# Patient Record
Sex: Female | Born: 1965 | Race: White | Marital: Married | Smoking: Former smoker
Health system: Southern US, Community
[De-identification: ages and names within clinical notes are randomized; demographics above are authoritative.]

## PROBLEM LIST (undated history)

## (undated) DIAGNOSIS — M1990 Unspecified osteoarthritis, unspecified site: Secondary | ICD-10-CM

## (undated) DIAGNOSIS — K222 Esophageal obstruction: Secondary | ICD-10-CM

## (undated) DIAGNOSIS — Z0000 Encounter for general adult medical examination without abnormal findings: Secondary | ICD-10-CM

## (undated) DIAGNOSIS — E040 Nontoxic diffuse goiter: Secondary | ICD-10-CM

## (undated) DIAGNOSIS — R748 Abnormal levels of other serum enzymes: Secondary | ICD-10-CM

## (undated) DIAGNOSIS — K259 Gastric ulcer, unspecified as acute or chronic, without hemorrhage or perforation: Secondary | ICD-10-CM

## (undated) DIAGNOSIS — E785 Hyperlipidemia, unspecified: Secondary | ICD-10-CM

## (undated) DIAGNOSIS — K760 Fatty (change of) liver, not elsewhere classified: Secondary | ICD-10-CM

## (undated) HISTORY — DX: Abnormal levels of other serum enzymes: R74.8

## (undated) HISTORY — DX: Unspecified osteoarthritis, unspecified site: M19.90

## (undated) HISTORY — DX: Gastric ulcer, unspecified as acute or chronic, without hemorrhage or perforation: K25.9

## (undated) HISTORY — DX: Hyperlipidemia, unspecified: E78.5

## (undated) HISTORY — PX: COSMETIC SURGERY: SHX468

## (undated) HISTORY — DX: Nontoxic diffuse goiter: E04.0

## (undated) HISTORY — DX: Encounter for general adult medical examination without abnormal findings: Z00.00

## (undated) HISTORY — DX: Fatty (change of) liver, not elsewhere classified: K76.0

## (undated) HISTORY — DX: Esophageal obstruction: K22.2

## (undated) HISTORY — PX: TONSILLECTOMY AND ADENOIDECTOMY: SUR1326

## (undated) HISTORY — PX: INNER EAR SURGERY: SHX679

---

## 2006-05-02 HISTORY — PX: CERVICAL CONIZATION W/BX: SHX1330

## 2013-10-07 ENCOUNTER — Encounter (INDEPENDENT_AMBULATORY_CARE_PROVIDER_SITE_OTHER): Payer: Self-pay

## 2013-10-07 ENCOUNTER — Encounter: Payer: Self-pay | Admitting: Family Medicine

## 2013-10-07 ENCOUNTER — Ambulatory Visit (INDEPENDENT_AMBULATORY_CARE_PROVIDER_SITE_OTHER): Payer: No Typology Code available for payment source | Admitting: Family Medicine

## 2013-10-07 VITALS — BP 131/89 | HR 80 | Ht 62.0 in | Wt 138.0 lb

## 2013-10-07 DIAGNOSIS — M25552 Pain in left hip: Secondary | ICD-10-CM

## 2013-10-07 DIAGNOSIS — M25559 Pain in unspecified hip: Secondary | ICD-10-CM

## 2013-10-07 DIAGNOSIS — M25512 Pain in left shoulder: Secondary | ICD-10-CM

## 2013-10-07 DIAGNOSIS — M25551 Pain in right hip: Secondary | ICD-10-CM

## 2013-10-07 DIAGNOSIS — M25519 Pain in unspecified shoulder: Secondary | ICD-10-CM

## 2013-10-07 NOTE — Patient Instructions (Signed)
You have rotator cuff impingement Try to avoid painful activities (overhead activities, lifting with extended arm) as much as possible. Aleve 2 tabs twice a day with food OR ibuprofen 3 tabs three times a day with food for pain and inflammation for 7-10 days then as needed. Can take tylenol in addition to this. Subacromial injection may be beneficial to help with pain and to decrease inflammation - will consider if exercises and anti-inflammatories not helpful enough. Consider physical therapy with transition to home exercise program. Do home exercise program with theraband and scapular stabilization exercises daily 3 sets of 10 once a day - these are very important for long term relief. If not improving at follow-up we will consider further imaging, physical therapy, injection, and/or nitro patches.  Your right hip pain is likely due to mild hip arthritis. Take tylenol 500mg  1-2 tabs three times a day for pain. Aleve 1-2 tabs twice a day with food Glucosamine sulfate 750mg  twice a day is a supplement that may help. Capsaicin topically up to four times a day may also help with pain. It's important that you continue to stay active. Consider physical therapy to strengthen muscles around the joint that hurts to take pressure off of the joint itself. Shoe inserts with good arch support may be helpful. Heat or ice 15 minutes at a time 3-4 times a day as needed to help with pain.  Your left hip pain is due to mild IT band syndrome, intermittent bursitis. Do home exercises 3 sets of 10 once a day (hip side raises, standing hip rotations). Pick 2-3 stretches from the handout, hold for 20-30 seconds and repeat each stretch 3 times once a day. Icing if needed 15 minutes at a time. Follow up with me in 5-6 weeks for these issues.

## 2013-10-08 ENCOUNTER — Encounter: Payer: Self-pay | Admitting: Family Medicine

## 2013-10-08 DIAGNOSIS — M25512 Pain in left shoulder: Secondary | ICD-10-CM | POA: Insufficient documentation

## 2013-10-08 DIAGNOSIS — M25551 Pain in right hip: Secondary | ICD-10-CM | POA: Insufficient documentation

## 2013-10-08 DIAGNOSIS — M25552 Pain in left hip: Secondary | ICD-10-CM | POA: Insufficient documentation

## 2013-10-08 NOTE — Assessment & Plan Note (Signed)
2/2 mild DJD, less likely labral tear.  Tylenol, nsaids, glucosamine, capsaicin, hip strengthening.  Consider PT, imaging.

## 2013-10-08 NOTE — Assessment & Plan Note (Signed)
2/2 mild IT band syndrome and bursitis.  Start home strengthening and stretching.  Icing as needed.  Consider injection if localizes and becomes constant at trochanteric bursa.  F/u in 5-6 weeks.

## 2013-10-08 NOTE — Assessment & Plan Note (Signed)
Left shoulder rotator cuff impingement - start with nsaids, avoiding overhead/reaching activities, home exercise program.  Consider PT, injection, nitro patches if not improving.

## 2013-10-08 NOTE — Progress Notes (Signed)
Patient ID: Shawna Johnson, female   DOB: 1966-02-04, 48 y.o.   MRN: [id number]  PCP: No primary provider on file.  Subjective:   HPI: Patient is a 48 y.o. female here for bilateral hip, left shoulder pain.  1. Left shoulder pain Patient denies known injury. Started when reaching back to wash her back in the shower. Worse with sleeping on this side, reaching up and behind her. No swelling or bruising. No neck pain or numbness/tingling.  2. Bilateral hip pain Left side painful laterally, right hip within groin. Only bothers her with walking or jogging. Walks 4-5 miles a day. Occasionally takes advil. No back pain, numbness/tingling.  Past Medical History  Diagnosis Date  . Hyperlipidemia     No current outpatient prescriptions on file prior to visit.   No current facility-administered medications on file prior to visit.    History reviewed. No pertinent past surgical history.  No Known Allergies  History   Social History  . Marital Status: Married    Spouse Name: N/A    Number of Children: N/A  . Years of Education: N/A   Occupational History  . Not on file.   Social History Main Topics  . Smoking status: Current Every Day Smoker  . Smokeless tobacco: Not on file     Comment: is using the e-cigarette  . Alcohol Use: Not on file  . Drug Use: Not on file  . Sexual Activity: Not on file   Other Topics Concern  . Not on file   Social History Narrative  . No narrative on file    Family History  Problem Relation Age of Onset  . Heart attack Father   . Hyperlipidemia Father   . Hypertension Father   . Sudden death Father     BP 131/89  Pulse 80  Ht 5\' 2"  (1.575 m)  Wt 138 lb (62.596 kg)  BMI 25.23 kg/m2  Review of Systems: See HPI above.    Objective:  Physical Exam:  Gen: NAD  Left shoulder: No swelling, ecchymoses.  No gross deformity. No TTP AC, biceps tendon. FROM without painful arc. Negative Hawkins, Neers. Negative Speeds,  Yergasons. Strength 5/5 with positive empty can and 5/5 no pain with resisted internal/external rotation. Negative apprehension. NV intact distally.  Back/hips: No gross deformity, scoliosis. TTP mildly left lateral hip over trochanter.  No midline or bony TTP.  No other back, hip tenderness. FROM with minimal pain on right logroll. Strength LEs 5/5 all muscle groups.   2+ MSRs in patellar and achilles tendons, equal bilaterally. Negative SLRs. Sensation intact to light touch bilaterally. Negative fabers and piriformis stretches. Mild pain with 4/5 strength left hip abduction.  Assessment & Plan:  1. Left shoulder rotator cuff impingement - start with nsaids, avoiding overhead/reaching activities, home exercise program.  Consider PT, injection, nitro patches if not improving.  2. Right hip pain - 2/2 mild DJD, less likely labral tear.  Tylenol, nsaids, glucosamine, capsaicin, hip strengthening.  Consider PT, imaging.  3. Left hip pain - 2/2 mild IT band syndrome and bursitis.  Start home strengthening and stretching.  Icing as needed.  Consider injection if localizes and becomes constant at trochanteric bursa.  F/u in 5-6 weeks.

## 2013-11-10 ENCOUNTER — Ambulatory Visit: Payer: BC Managed Care – PPO | Admitting: Family Medicine

## 2013-12-22 ENCOUNTER — Ambulatory Visit (INDEPENDENT_AMBULATORY_CARE_PROVIDER_SITE_OTHER): Payer: No Typology Code available for payment source | Admitting: Internal Medicine

## 2013-12-22 ENCOUNTER — Encounter: Payer: Self-pay | Admitting: Internal Medicine

## 2013-12-22 VITALS — BP 119/70 | HR 62 | Resp 16 | Ht 62.0 in | Wt 142.0 lb

## 2013-12-22 DIAGNOSIS — F17200 Nicotine dependence, unspecified, uncomplicated: Secondary | ICD-10-CM

## 2013-12-22 DIAGNOSIS — R87612 Low grade squamous intraepithelial lesion on cytologic smear of cervix (LGSIL): Secondary | ICD-10-CM

## 2013-12-22 DIAGNOSIS — R87619 Unspecified abnormal cytological findings in specimens from cervix uteri: Secondary | ICD-10-CM | POA: Insufficient documentation

## 2013-12-22 DIAGNOSIS — B977 Papillomavirus as the cause of diseases classified elsewhere: Secondary | ICD-10-CM

## 2013-12-22 DIAGNOSIS — Z789 Other specified health status: Secondary | ICD-10-CM | POA: Insufficient documentation

## 2013-12-22 DIAGNOSIS — E785 Hyperlipidemia, unspecified: Secondary | ICD-10-CM

## 2013-12-22 NOTE — Progress Notes (Signed)
   Subjective:    Patient ID: Shawna Johnson, female    DOB: 28-Oct-1965, 48 y.o.   MRN: [id number]  HPI Shawna Johnson is a new pt here for first visit.     PMH of abnormal mm ([facility] last one 09/2013),  ASCUS s/P leep in 2007 in [place],  Hyperlipidemia (on 5 mg lipitor for past 5 years) ,  DJD, and menopause   Abnormal pap:   Pt report Leep in 2007 and has been seeing [facility] since then.    She states paps have been normal post Leep procedure  Hyerlipidemia  She does not like to take meds every day   She does mention low libido.  Lots of changes in life.  Recently married 2013.  Moved to [place] in 2012, married June 2013 and daughter moved 03/2012.   Occasionally sad but not daily.  Does not cry  Denies daily depression.  Good relationship with husband.     Low libido not creating a lot of tension now  E-cig use  She is lowering her nicotine to try to stop completely   Review of Systems    see HPI Objective:   Physical Exam  Physical Exam  Nursing note and vitals reviewed.  Constitutional: She is oriented to person, place, and time. She appears well-developed and well-nourished.  HENT:  Head: Normocephalic and atraumatic.  Cardiovascular: Normal rate and regular rhythm. Exam reveals no gallop and no friction rub.  No murmur heard.  Pulmonary/Chest: Breath sounds normal. She has no wheezes. She has no rales.  Neurological: She is alert and oriented to person, place, and time.  Skin: Skin is warm and dry.  Psychiatric: She has a normal mood and affect. Her behavior is normal.             Assessment & Plan:  Hyperlipidemia  Will check fasting lipids at CPE in October.  Pt does not like to take daily meds.  ASCUS  S/P leep  Pt reports normal paps since 2007  Abnormal mm needs repeat in October  Prior ones done at [facility]  DJD  Menopause / low libido  Given HT edcuational sheet.  Advised no FDA approved HT for libido .  FDA studying  flibanserin but not approved now    E cigarette use.  She is lowering her nicotine concentration as she is trying to quit

## 2013-12-22 NOTE — Patient Instructions (Signed)
Keep CPE appt. With me

## 2014-01-07 ENCOUNTER — Encounter: Payer: Self-pay | Admitting: Internal Medicine

## 2014-01-07 DIAGNOSIS — N63 Unspecified lump in unspecified breast: Secondary | ICD-10-CM | POA: Insufficient documentation

## 2014-01-07 DIAGNOSIS — R921 Mammographic calcification found on diagnostic imaging of breast: Secondary | ICD-10-CM | POA: Insufficient documentation

## 2014-01-22 ENCOUNTER — Encounter: Payer: Self-pay | Admitting: *Deleted

## 2014-03-05 ENCOUNTER — Other Ambulatory Visit: Payer: Self-pay | Admitting: *Deleted

## 2014-03-05 ENCOUNTER — Telehealth: Payer: Self-pay | Admitting: *Deleted

## 2014-03-05 DIAGNOSIS — R928 Other abnormal and inconclusive findings on diagnostic imaging of breast: Secondary | ICD-10-CM

## 2014-03-05 NOTE — Telephone Encounter (Signed)
[facility] called needing a bilateral diagnostic MM order. Placed order and faxed as requested.

## 2014-03-22 ENCOUNTER — Telehealth: Payer: Self-pay | Admitting: Internal Medicine

## 2014-03-22 NOTE — Telephone Encounter (Signed)
Shawna Johnson  Call pt and let her know that I have received her mammogram report.    Her breast calcificatiions show no change in the past 2 years.  The radiologist recommends that she can go back to her normal screening yearly mammogram    thanks

## 2014-03-22 NOTE — Telephone Encounter (Signed)
I spoke with Shawna Johnson and she is aware of her mammogram results and that she can go back to her yearly mammogram screenings.-eh

## 2014-03-24 ENCOUNTER — Encounter: Payer: Self-pay | Admitting: *Deleted

## 2014-04-26 ENCOUNTER — Other Ambulatory Visit: Payer: Self-pay | Admitting: *Deleted

## 2014-04-26 DIAGNOSIS — E785 Hyperlipidemia, unspecified: Secondary | ICD-10-CM

## 2014-04-26 DIAGNOSIS — Z0000 Encounter for general adult medical examination without abnormal findings: Secondary | ICD-10-CM

## 2014-04-27 ENCOUNTER — Encounter: Payer: Self-pay | Admitting: Internal Medicine

## 2014-04-27 ENCOUNTER — Ambulatory Visit (HOSPITAL_BASED_OUTPATIENT_CLINIC_OR_DEPARTMENT_OTHER)
Admission: RE | Admit: 2014-04-27 | Discharge: 2014-04-27 | Disposition: A | Discharge status: Home / Self Care | Payer: No Typology Code available for payment source | Source: Ambulatory Visit | Attending: Internal Medicine | Admitting: Internal Medicine

## 2014-04-27 ENCOUNTER — Ambulatory Visit (INDEPENDENT_AMBULATORY_CARE_PROVIDER_SITE_OTHER): Payer: No Typology Code available for payment source | Admitting: Internal Medicine

## 2014-04-27 VITALS — BP 110/70 | HR 94 | Temp 97.9°F | Resp 16 | Ht 62.5 in | Wt 143.0 lb

## 2014-04-27 DIAGNOSIS — Z124 Encounter for screening for malignant neoplasm of cervix: Secondary | ICD-10-CM

## 2014-04-27 DIAGNOSIS — Z720 Tobacco use: Secondary | ICD-10-CM

## 2014-04-27 DIAGNOSIS — R921 Mammographic calcification found on diagnostic imaging of breast: Secondary | ICD-10-CM

## 2014-04-27 DIAGNOSIS — E010 Iodine-deficiency related diffuse (endemic) goiter: Secondary | ICD-10-CM

## 2014-04-27 DIAGNOSIS — E049 Nontoxic goiter, unspecified: Secondary | ICD-10-CM | POA: Diagnosis present

## 2014-04-27 DIAGNOSIS — Z0189 Encounter for other specified special examinations: Secondary | ICD-10-CM

## 2014-04-27 DIAGNOSIS — E785 Hyperlipidemia, unspecified: Secondary | ICD-10-CM

## 2014-04-27 DIAGNOSIS — R928 Other abnormal and inconclusive findings on diagnostic imaging of breast: Secondary | ICD-10-CM

## 2014-04-27 DIAGNOSIS — Z0000 Encounter for general adult medical examination without abnormal findings: Secondary | ICD-10-CM

## 2014-04-27 DIAGNOSIS — Z789 Other specified health status: Secondary | ICD-10-CM

## 2014-04-27 DIAGNOSIS — Z1151 Encounter for screening for human papillomavirus (HPV): Secondary | ICD-10-CM

## 2014-04-27 LAB — CBC WITH DIFFERENTIAL/PLATELET
BASOS ABS: 0.1 10*3/uL (ref 0.0–0.1)
Basophils Relative: 1 % (ref 0–1)
EOS ABS: 0.2 10*3/uL (ref 0.0–0.7)
Eosinophils Relative: 3 % (ref 0–5)
HCT: 40 % (ref 36.0–46.0)
Hemoglobin: 13.6 g/dL (ref 12.0–15.0)
LYMPHS PCT: 42 % (ref 12–46)
Lymphs Abs: 3.2 10*3/uL (ref 0.7–4.0)
MCH: 29.8 pg (ref 26.0–34.0)
MCHC: 34 g/dL (ref 30.0–36.0)
MCV: 87.5 fL (ref 78.0–100.0)
Monocytes Absolute: 0.7 10*3/uL (ref 0.1–1.0)
Monocytes Relative: 9 % (ref 3–12)
Neutro Abs: 3.5 10*3/uL (ref 1.7–7.7)
Neutrophils Relative %: 45 % (ref 43–77)
PLATELETS: 378 10*3/uL (ref 150–400)
RBC: 4.57 MIL/uL (ref 3.87–5.11)
RDW: 13.9 % (ref 11.5–15.5)
WBC: 7.7 10*3/uL (ref 4.0–10.5)

## 2014-04-27 LAB — POCT URINALYSIS DIPSTICK
BILIRUBIN UA: NEGATIVE
GLUCOSE UA: NEGATIVE
Ketones, UA: NEGATIVE
LEUKOCYTES UA: NEGATIVE
NITRITE UA: NEGATIVE
Protein, UA: NEGATIVE
Spec Grav, UA: 1.02
UROBILINOGEN UA: NEGATIVE
pH, UA: 6.5

## 2014-04-27 LAB — COMPLETE METABOLIC PANEL WITH GFR
ALBUMIN: 4.4 g/dL (ref 3.5–5.2)
ALT: 14 U/L (ref 0–35)
AST: 14 U/L (ref 0–37)
Alkaline Phosphatase: 128 U/L — ABNORMAL HIGH (ref 39–117)
BUN: 16 mg/dL (ref 6–23)
CALCIUM: 9.5 mg/dL (ref 8.4–10.5)
CO2: 23 mEq/L (ref 19–32)
CREATININE: 0.92 mg/dL (ref 0.50–1.10)
Chloride: 102 mEq/L (ref 96–112)
GFR, Est African American: 84 mL/min
GFR, Est Non African American: 73 mL/min
Glucose, Bld: 91 mg/dL (ref 70–99)
Potassium: 4.3 mEq/L (ref 3.5–5.3)
Sodium: 141 mEq/L (ref 135–145)
Total Bilirubin: 0.4 mg/dL (ref 0.2–1.2)
Total Protein: 6.9 g/dL (ref 6.0–8.3)

## 2014-04-27 LAB — LIPID PANEL
CHOL/HDL RATIO: 3.2 ratio
CHOLESTEROL: 177 mg/dL (ref 0–200)
HDL: 55 mg/dL (ref >39)
LDL Cholesterol: 97 mg/dL (ref 0–99)
Triglycerides: 125 mg/dL (ref <150)
VLDL: 25 mg/dL (ref 0–40)

## 2014-04-27 LAB — TSH: TSH: 0.947 u[IU]/mL (ref 0.350–4.500)

## 2014-04-27 NOTE — Progress Notes (Signed)
Subjective:    Patient ID: Shawna Johnson, female    DOB: [birth year], 48 y.o.   MRN: [id number]  HPI 11/2013 visit   & Plan:     Hyperlipidemia Will check fasting lipids at CPE in October. Pt does not like to take daily meds.  ASCUS S/P leep Pt reports normal paps since 2007  Abnormal mm needs repeat in October Prior ones done at [facility]  DJD  Menopause / low libido Given HT edcuational sheet. Advised no FDA approved HT for libido . FDA studying flibanserin but not approved now  E cigarette use. She is lowering her nicotine concentration as she is trying to quit          Encounter   Today    [place] is her for CPE  HM:  Pap Dr.Louk 2014  ( I do not have results)  ,   She is due for first colonoscopy , mm neg (calcification follow up 03/2014)    She is a 30 year smoker   Problem list and meds reviewed   Allergies  Allergen Reactions  . Sulfa Antibiotics Swelling   Past Medical History  Diagnosis Date  . Hyperlipidemia    Past Surgical History  Procedure Laterality Date  . Cervical conization w/bx  05/2006  . Inner ear surgery      several childhood   History   Social History  . Marital Status: Married    Spouse Name: N/A    Number of Children: N/A  . Years of Education: N/A   Occupational History  . Not on file.   Social History Main Topics  . Smoking status: Current Every Day Smoker    Types: E-cigarettes  . Smokeless tobacco: Never Used     Comment: is using the e-cigarette  . Alcohol Use: Yes     Comment: Rare  . Drug Use: No  . Sexual Activity: Yes    Partners: Male   Other Topics Concern  . Not on file   Social History Narrative  . No narrative on file   Family History  Problem Relation Age of Onset  . Heart attack Father   . Hyperlipidemia Father   . Hypertension Father   . Sudden death Father   . Breast cancer Maternal Aunt    Patient Active Problem List   Diagnosis Date Noted  . Breast calcifications on  mammogram  R breast stable for 18 months [facility] 01/07/2014  . Breast nodule  decreasing size mm 09/2013 [facility] 01/07/2014  . 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 Abnormal Pap smear of cervix  S/P Leep normal pap since 2007 12/22/2013  . Hyperlipidemia 12/22/2013  . Electronic cigarette use 12/22/2013  . Left shoulder pain 10/08/2013  . Left hip pain 10/08/2013  . Right hip pain 10/08/2013   Current Outpatient Prescriptions on File Prior to Visit  Medication Sig Dispense Refill  . atorvastatin (LIPITOR) 10 MG tablet Take 10 mg by mouth daily.       No current facility-administered medications on file prior to visit.      Review of Systems    see HPI Objective:   Physical Exam Physical Exam  Vital signs and nursing note reviewed  Constitutional: She is oriented to person, place, and time. She appears well-developed and well-nourished. She is cooperative.  HENT:  Head: Normocephalic and atraumatic.  Right Ear: Tympanic membrane normal.  Left Ear: Tympanic membrane normal.  Nose: Nose normal.  Mouth/Throat: Oropharynx is clear and  moist and mucous membranes are normal. No oropharyngeal exudate or posterior oropharyngeal erythema.  Eyes: Conjunctivae and EOM are normal. Pupils are equal, round, and reactive to light.  Neck: Neck supple. No JVD present. Carotid bruit is not present. No mass and no thyromegaly present.  Cardiovascular: Regular rhythm, normal heart sounds, intact distal pulses and normal pulses.  Exam reveals no gallop and no friction rub.   No murmur heard. Pulses:      Dorsalis pedis pulses are 2+ on the right side, and 2+ on the left side.  Pulmonary/Chest: Breath sounds normal. She has no wheezes. She has no rhonchi. She has no rales. Right breast exhibits no mass, no nipple discharge and no skin change. Left breast exhibits no mass, no nipple discharge and no skin change.  Abdominal: Soft. Bowel sounds are normal. She exhibits no  distension and no mass. There is no hepatosplenomegaly. There is no tenderness. There is no CVA tenderness.  Rectal no mass guaiac neg Genitourinary: Rectum normal, vagina normal and uterus normal. Rectal exam shows no mass. Guaiac negative stool. No labial fusion. There is no lesion on the right labia. There is no lesion on the left labia. Cervix exhibits no motion tenderness. Right adnexum displays no mass, no tenderness and no fullness. Left adnexum displays no mass, no tenderness and no fullness. No erythema around the vagina.  Musculoskeletal:       No active synovitis to any joint.    Lymphadenopathy:       Right cervical: No superficial cervical adenopathy present.      Left cervical: No superficial cervical adenopathy present.       Right axillary: No pectoral and no lateral adenopathy present.       Left axillary: No pectoral and no lateral adenopathy present.      Right: No inguinal adenopathy present.       Left: No inguinal adenopathy present.  Neurological: She is alert and oriented to person, place, and time. She has normal strength and normal reflexes. No cranial nerve deficit or sensory deficit. She displays a negative Romberg sign. Coordination and gait normal.  Skin: Skin is warm and dry. No abrasion, no bruising, no ecchymosis and no rash noted. No cyanosis. Nails show no clubbing.   She has multicolored flat lesion Right side of chest wall been present for [facility]  Psychiatric: She has a normal mood and affect. Her speech is normal and behavior is normal.          Assessment & Plan:   HM  Pap today,  Will refer for colonoscopy.  Discussed lung CA screening guidelines   Pt underage for screening CT . Phone number given for opthalmologist for first exam   Hyperlipidemia  On Zocor  Will check labs today   Thyromegaly:  Will get U/S today  Breast calcifications  Stable for a 2 year period.  Radiologist recommends screening in one year  Pt counseled   Skin lesion chest:   Pt has seen dermatologist for this lesion.  Advised to see her dematologist   E-cig use  Advised cessation.   Pt not ready now.         Assessment & Plan:

## 2014-04-27 NOTE — Patient Instructions (Signed)
Will set up referral to GI for colonoscopy   Give phone number to opthalmologist  Dr. Tora [facility]red [facility] . Patient to make appointmnet   To xray today    To lab today

## 2014-04-28 LAB — VITAMIN D 25 HYDROXY (VIT D DEFICIENCY, FRACTURES): VIT D 25 HYDROXY: 101 ng/mL — AB (ref 30–89)

## 2014-04-28 LAB — CYTOLOGY - PAP

## 2014-04-29 ENCOUNTER — Encounter: Payer: Self-pay | Admitting: Internal Medicine

## 2014-05-03 ENCOUNTER — Encounter: Payer: Self-pay | Admitting: Internal Medicine

## 2014-05-03 NOTE — Progress Notes (Signed)
I spoke with Shawna Johnson and went over the U/S results with her. She voiced understanding and that we would repeat this U/S in one year-eh

## 2014-05-10 ENCOUNTER — Telehealth: Payer: Self-pay | Admitting: Internal Medicine

## 2014-05-10 ENCOUNTER — Encounter: Payer: Self-pay | Admitting: Internal Medicine

## 2014-05-10 NOTE — Telephone Encounter (Signed)
Spoke with pt and informed of elevated alkaline phos  Will se her in December and repeat at that time

## 2014-05-16 ENCOUNTER — Encounter: Payer: Self-pay | Admitting: Internal Medicine

## 2014-05-16 ENCOUNTER — Telehealth: Payer: Self-pay | Admitting: Internal Medicine

## 2014-05-16 DIAGNOSIS — E042 Nontoxic multinodular goiter: Secondary | ICD-10-CM | POA: Insufficient documentation

## 2014-05-16 NOTE — Telephone Encounter (Signed)
Shawna Johnson   Call pt and let her know that I did receive the old records from Dr. Shelly Flatten her former GYN.  I recommend that she have yearly pap testing done.   Just document that you advised her of this in her chart

## 2014-05-17 NOTE — Telephone Encounter (Signed)
I spoke with Ginger and let her know that Dr. Coralyn Mark recommended that she get a PAP smear test done every year. -eh

## 2014-06-08 ENCOUNTER — Other Ambulatory Visit: Payer: Self-pay | Admitting: Internal Medicine

## 2014-06-08 ENCOUNTER — Ambulatory Visit (INDEPENDENT_AMBULATORY_CARE_PROVIDER_SITE_OTHER): Payer: No Typology Code available for payment source | Admitting: Internal Medicine

## 2014-06-08 ENCOUNTER — Encounter: Payer: Self-pay | Admitting: Internal Medicine

## 2014-06-08 VITALS — BP 118/73 | HR 73 | Resp 16 | Ht 62.5 in | Wt 145.0 lb

## 2014-06-08 DIAGNOSIS — Z23 Encounter for immunization: Secondary | ICD-10-CM

## 2014-06-08 DIAGNOSIS — R748 Abnormal levels of other serum enzymes: Secondary | ICD-10-CM

## 2014-06-08 NOTE — Patient Instructions (Signed)
See me as needed 

## 2014-06-08 NOTE — Progress Notes (Signed)
Subjective:    Patient ID: Shawna Johnson, female    DOB: Nov 05, 1965, 48 y.o.   MRN: [id number]  HPI 04/27/2014 HM Pap today, Will refer for colonoscopy. Discussed lung CA screening guidelines Pt underage for screening CT . Phone number given for opthalmologist for first exam   Hyperlipidemia On Zocor Will check labs today   Thyromegaly: Will get U/S today  Breast calcifications Stable for a 2 year period. Radiologist recommends screening in one year Pt counseled   Skin lesion chest: Pt has seen dermatologist for this lesion. Advised to see her dematologist   E-cig use Advised cessation. Pt not ready now.   TODAY :  Shawna Johnson is here for follow up of elevated alk phos. And elevated vitamin D.  She tells me she is now only taking 800 IU of vitamin D   No history of hepatitis, no abd pain   Allergies  Allergen Reactions  . Sulfa Antibiotics Swelling   Past Medical History  Diagnosis Date  . Hyperlipidemia    Past Surgical History  Procedure Laterality Date  . Cervical conization w/bx  05/2006  . Inner ear surgery      several childhood   History   Social History  . Marital Status: Married    Spouse Name: N/A    Number of Children: N/A  . Years of Education: N/A   Occupational History  . Not on file.   Social History Main Topics  . Smoking status: Current Every Day Smoker    Types: E-cigarettes  . Smokeless tobacco: Never Used     Comment: is using the e-cigarette  . Alcohol Use: Yes     Comment: Rare  . Drug Use: No  . Sexual Activity:    Partners: Male   Other Topics Concern  . Not on file   Social History Narrative   Family History  Problem Relation Age of Onset  . Heart attack Father   . Hyperlipidemia Father   . Hypertension Father   . Sudden death Father   . Breast cancer Maternal Aunt   . Congestive Heart Failure Sister    Patient Active Problem List   Diagnosis Date Noted  . Multiple thyroid nodules  see U/S 2015 mm  sized in left lobe 05/16/2014  . Breast calcifications on mammogram  R breast stable for 18 months [facility] 01/07/2014  . Breast nodule  decreasing size mm 09/2013 [facility] 01/07/2014  . 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 Abnormal Pap smear of cervix  S/P Leep normal pap since 2007 12/22/2013  . Hyperlipidemia 12/22/2013  . Electronic cigarette use 12/22/2013  . Left shoulder pain 10/08/2013  . Left hip pain 10/08/2013  . Right hip pain 10/08/2013   Current Outpatient Prescriptions on File Prior to Visit  Medication Sig Dispense Refill  . atorvastatin (LIPITOR) 10 MG tablet Take 10 mg by mouth daily.     No current facility-administered medications on file prior to visit.        Review of Systems See HPI    Objective:   Physical Exam Physical Exam  Nursing note and vitals reviewed.  Constitutional: She is oriented to person, place, and time. She appears well-developed and well-nourished.  HENT:  Head: Normocephalic and atraumatic.  Cardiovascular: Normal rate and regular rhythm. Exam reveals no gallop and no friction rub.  No murmur heard.  Pulmonary/Chest: Breath sounds normal. She has no wheezes. She has no rales.  Abd  Soft NT/ND no hsm  BS  pos Neurological: She is alert and oriented to person, place, and time.  Skin: Skin is warm and dry.  Psychiatric: She has a normal mood and affect. Her behavior is normal.        Assessment & Plan:  Elevated alk phos will recheck with GGT and ANA further management based on results  Elevated vitamin D  Pt has reduced her dose ot 800 IU

## 2014-06-09 LAB — HEPATITIS PANEL, ACUTE
HCV AB: NEGATIVE
HEP A IGM: NONREACTIVE
HEP B S AG: NEGATIVE
Hep B C IgM: NONREACTIVE

## 2014-06-09 LAB — COMPREHENSIVE METABOLIC PANEL
ALBUMIN: 4.3 g/dL (ref 3.5–5.2)
ALT: 18 U/L (ref 0–35)
AST: 17 U/L (ref 0–37)
Alkaline Phosphatase: 130 U/L — ABNORMAL HIGH (ref 39–117)
BUN: 15 mg/dL (ref 6–23)
CHLORIDE: 103 meq/L (ref 96–112)
CO2: 26 mEq/L (ref 19–32)
Calcium: 9.3 mg/dL (ref 8.4–10.5)
Creat: 0.77 mg/dL (ref 0.50–1.10)
Glucose, Bld: 91 mg/dL (ref 70–99)
POTASSIUM: 4.4 meq/L (ref 3.5–5.3)
Sodium: 140 mEq/L (ref 135–145)
TOTAL PROTEIN: 6.3 g/dL (ref 6.0–8.3)
Total Bilirubin: 0.5 mg/dL (ref 0.2–1.2)

## 2014-06-09 LAB — GAMMA GT: GGT: 20 U/L (ref 7–51)

## 2014-06-09 LAB — ACUTE HEP PANEL AND HEP B SURFACE AB
HCV Ab: NEGATIVE
HEP B S AG: NEGATIVE
Hep A IgM: NONREACTIVE
Hep B C IgM: NONREACTIVE
Hep B S Ab: NEGATIVE

## 2014-06-09 LAB — ANA: Anti Nuclear Antibody(ANA): NEGATIVE

## 2014-06-15 ENCOUNTER — Telehealth: Payer: Self-pay | Admitting: Internal Medicine

## 2014-06-15 DIAGNOSIS — R7989 Other specified abnormal findings of blood chemistry: Secondary | ICD-10-CM

## 2014-06-15 DIAGNOSIS — R945 Abnormal results of liver function studies: Principal | ICD-10-CM

## 2014-06-15 NOTE — Telephone Encounter (Signed)
Spoke with pt and informed of lab results  Pt did tell me she had mild elevation of alk phos dating back to 2010  Will get liver ultrasound.    She has colonoscopy scheduled in January

## 2014-06-21 ENCOUNTER — Encounter: Payer: No Typology Code available for payment source | Admitting: Internal Medicine

## 2014-07-05 ENCOUNTER — Ambulatory Visit (HOSPITAL_BASED_OUTPATIENT_CLINIC_OR_DEPARTMENT_OTHER)
Admission: RE | Admit: 2014-07-05 | Discharge: 2014-07-05 | Disposition: A | Discharge status: Home / Self Care | Payer: No Typology Code available for payment source | Source: Ambulatory Visit | Attending: Internal Medicine | Admitting: Internal Medicine

## 2014-07-05 ENCOUNTER — Other Ambulatory Visit (HOSPITAL_BASED_OUTPATIENT_CLINIC_OR_DEPARTMENT_OTHER): Payer: No Typology Code available for payment source

## 2014-07-05 DIAGNOSIS — R7989 Other specified abnormal findings of blood chemistry: Secondary | ICD-10-CM | POA: Insufficient documentation

## 2014-07-05 DIAGNOSIS — E785 Hyperlipidemia, unspecified: Secondary | ICD-10-CM | POA: Insufficient documentation

## 2014-07-05 DIAGNOSIS — R945 Abnormal results of liver function studies: Secondary | ICD-10-CM

## 2014-07-06 ENCOUNTER — Ambulatory Visit (AMBULATORY_SURGERY_CENTER): Payer: Self-pay | Admitting: *Deleted

## 2014-07-06 VITALS — Ht 62.5 in | Wt 141.0 lb

## 2014-07-06 DIAGNOSIS — Z1211 Encounter for screening for malignant neoplasm of colon: Secondary | ICD-10-CM

## 2014-07-06 MED ORDER — MOVIPREP 100 G PO SOLR: ORAL | Status: DC

## 2014-07-06 NOTE — Progress Notes (Signed)
No egg or soy allergy  No intubation problems per pt; did have nausea and vomiting with anesthesia one time  No diet medications taken  Registered in [facility]

## 2014-07-08 ENCOUNTER — Telehealth: Payer: Self-pay | Admitting: Internal Medicine

## 2014-07-08 NOTE — Telephone Encounter (Signed)
Spoke with pt   No mass in liver,  Fatty infiltration    Advised pt to see me in February and will repeat hepatic profile then.  She has colonoscopy later this month.  She voices understanding

## 2014-07-20 ENCOUNTER — Ambulatory Visit (AMBULATORY_SURGERY_CENTER): Payer: No Typology Code available for payment source | Admitting: Internal Medicine

## 2014-07-20 ENCOUNTER — Encounter: Payer: Self-pay | Admitting: Internal Medicine

## 2014-07-20 VITALS — BP 111/69 | HR 56 | Temp 97.3°F | Resp 20 | Ht 62.5 in | Wt 141.0 lb

## 2014-07-20 DIAGNOSIS — Z1211 Encounter for screening for malignant neoplasm of colon: Secondary | ICD-10-CM

## 2014-07-20 DIAGNOSIS — D123 Benign neoplasm of transverse colon: Secondary | ICD-10-CM

## 2014-07-20 MED ORDER — SODIUM CHLORIDE 0.9 % IV SOLN
500.0000 mL | INTRAVENOUS | Status: DC
Start: 2014-07-20 — End: 2014-07-20

## 2014-07-20 NOTE — Progress Notes (Signed)
Called to room to assist during endoscopic procedure.  Patient ID and intended procedure confirmed with present staff. Received instructions for my participation in the procedure from the performing physician.  

## 2014-07-20 NOTE — Progress Notes (Signed)
A/ox3 pleased with MAC, report to Suzanne RN 

## 2014-07-20 NOTE — Patient Instructions (Signed)
YOU HAD AN ENDOSCOPIC PROCEDURE TODAY AT THE [facility]: Refer to the procedure report that was given to you for any specific questions about what was found during the examination.  If the procedure report does not answer your questions, please call your gastroenterologist to clarify.  If you requested that your care partner not be given the details of your procedure findings, then the procedure report has been included in a sealed envelope for you to review at your convenience later.  YOU SHOULD EXPECT: Some feelings of bloating in the abdomen. Passage of more gas than usual.  Walking can help get rid of the air that was put into your GI tract during the procedure and reduce the bloating. If you had a lower endoscopy (such as a colonoscopy or flexible sigmoidoscopy) you may notice spotting of blood in your stool or on the toilet paper. If you underwent a bowel prep for your procedure, then you may not have a normal bowel movement for a few days.  DIET: Your first meal following the procedure should be a light meal and then it is ok to progress to your normal diet.  A half-sandwich or bowl of soup is an example of a good first meal.  Heavy or fried foods are harder to digest and may make you feel nauseous or bloated.  Likewise meals heavy in dairy and vegetables can cause extra gas to form and this can also increase the bloating.  Drink plenty of fluids but you should avoid alcoholic beverages for 24 hours.  ACTIVITY: Your care partner should take you home directly after the procedure.  You should plan to take it easy, moving slowly for the rest of the day.  You can resume normal activity the day after the procedure however you should NOT DRIVE or use heavy machinery for 24 hours (because of the sedation medicines used during the test).    SYMPTOMS TO REPORT IMMEDIATELY: A gastroenterologist can be reached at any hour.  During normal business hours, 8:30 AM to 5:00 PM Monday through Friday,  call [phone].  After hours and on weekends, please call the GI answering service at [phone] who will take a message and have the physician on call contact you.   Following lower endoscopy (colonoscopy or flexible sigmoidoscopy):  Excessive amounts of blood in the stool  Significant tenderness or worsening of abdominal pains  Swelling of the abdomen that is new, acute  Fever of 100F or higher  FOLLOW UP: If any biopsies were taken you will be contacted by phone or by letter within the next 1-3 weeks.  Call your gastroenterologist if you have not heard about the biopsies in 3 weeks.  Our staff will call the home number listed on your records the next business day following your procedure to check on you and address any questions or concerns that you may have at that time regarding the information given to you following your procedure. This is a courtesy call and so if there is no answer at the home number and we have not heard from you through the emergency physician on call, we will assume that you have returned to your regular daily activities without incident.  SIGNATURES/CONFIDENTIALITY: You and/or your care partner have signed paperwork which will be entered into your electronic medical record.  These signatures attest to the fact that that the information above on your After Visit Summary has been reviewed and is understood.  Full responsibility of the confidentiality of this   discharge information lies with you and/or your care-partner.   Please, read the handouts given to you by your recovery room nurse.

## 2014-07-20 NOTE — Op Note (Signed)
[place]  [address]. [place], [address]   COLONOSCOPY PROCEDURE REPORT  PATIENT: Shawna Johnson, Shawna Johnson  MR#: [id number] BIRTHDATE: [birth year] , 48  yrs. old GENDER: female ENDOSCOPIST: Jerene Bears, MD REFERRED [id number] Coralyn Mark, M.D. PROCEDURE DATE:  07/20/2014 PROCEDURE:   Colonoscopy with snare polypectomy First Screening Colonoscopy - Avg.  risk and is 50 yrs.  old or older Yes.  Prior Negative Screening - Now for repeat screening. N/A  History of Adenoma - Now for follow-up colonoscopy & has been > or = to 3 yrs.  N/A  Polyps Removed Today? Yes. ASA CLASS:   Class II INDICATIONS:average risk for colon cancer and first colonoscopy. MEDICATIONS: Monitored anesthesia care and Propofol 250 mg IV  DESCRIPTION OF PROCEDURE:   After the risks benefits and alternatives of the procedure were thoroughly explained, informed consent was obtained.  The digital rectal exam revealed no rectal mass.   The LB [id number]  endoscope was introduced through the anus and advanced to the cecum, which was identified by both the appendix and ileocecal valve. No adverse events experienced. The quality of the prep was good, using MoviPrep  The instrument was then slowly withdrawn as the colon was fully examined.   COLON FINDINGS: A sessile polyp measuring 5 mm in size was found at the hepatic flexure.  A polypectomy was performed with a cold snare.  The resection was complete, the polyp tissue was completely retrieved and sent to histology.   The examination was otherwise normal.  Retroflexed views revealed external hemorrhoids. The time to cecum=4 minutes 56 seconds.  Withdrawal time=12 minutes 26 seconds.  The scope was withdrawn and the procedure completed. COMPLICATIONS: There were no immediate complications.  ENDOSCOPIC IMPRESSION: 1.   Sessile polyp was found at the hepatic flexure; polypectomy was performed with a cold snare 2.   The examination was otherwise  normal  RECOMMENDATIONS: 1.  Await pathology results 2.  If the polyp removed today is proven to be an adenomatous (pre-cancerous) polyp, you will need a repeat colonoscopy in 5 years.  Otherwise you should continue to follow colorectal cancer screening guidelines for "routine risk" patients with colonoscopy in 10 years.  You will receive a letter within 1-2 weeks with the results of your biopsy as well as final recommendations.  Please call my office if you have not received a letter after 3 weeks.  eSigned:  Jerene Bears, MD 07/20/2014 9:15 AM   cc: Emi Belfast, MD and The Patient

## 2014-07-21 ENCOUNTER — Telehealth: Payer: Self-pay

## 2014-07-21 NOTE — Telephone Encounter (Signed)
  Follow up Call-  Call back number 07/20/2014  Post procedure Call Back phone  # [phone] cell  Permission to leave phone message Yes     Patient questions:  Do you have a fever, pain , or abdominal swelling? No. Pain Score  0 *  Have you tolerated food without any problems? Yes.    Have you been able to return to your normal activities? Yes.    Do you have any questions about your discharge instructions: Diet   No. Medications  No. Follow up visit  No.  Do you have questions or concerns about your Care? No.  Actions: * If pain score is 4 or above: No action needed, pain <4.

## 2014-07-27 ENCOUNTER — Encounter: Payer: Self-pay | Admitting: Internal Medicine

## 2014-07-30 ENCOUNTER — Telehealth: Payer: Self-pay | Admitting: *Deleted

## 2014-08-03 NOTE — Telephone Encounter (Signed)
Opened in error

## 2014-08-03 NOTE — Telephone Encounter (Signed)
opend in error

## 2014-08-23 ENCOUNTER — Ambulatory Visit (INDEPENDENT_AMBULATORY_CARE_PROVIDER_SITE_OTHER): Payer: No Typology Code available for payment source | Admitting: Internal Medicine

## 2014-08-23 ENCOUNTER — Encounter: Payer: Self-pay | Admitting: Internal Medicine

## 2014-08-23 VITALS — BP 124/68 | HR 109 | Resp 16 | Ht 62.5 in | Wt 146.0 lb

## 2014-08-23 DIAGNOSIS — K760 Fatty (change of) liver, not elsewhere classified: Secondary | ICD-10-CM

## 2014-08-23 DIAGNOSIS — R748 Abnormal levels of other serum enzymes: Secondary | ICD-10-CM

## 2014-08-23 LAB — HEPATIC FUNCTION PANEL
ALBUMIN: 4.2 g/dL (ref 3.5–5.2)
ALT: 20 U/L (ref 0–35)
AST: 15 U/L (ref 0–37)
Alkaline Phosphatase: 132 U/L — ABNORMAL HIGH (ref 39–117)
Bilirubin, Direct: 0.1 mg/dL (ref 0.0–0.3)
Indirect Bilirubin: 0.3 mg/dL (ref 0.2–1.2)
TOTAL PROTEIN: 6.5 g/dL (ref 6.0–8.3)
Total Bilirubin: 0.4 mg/dL (ref 0.2–1.2)

## 2014-08-23 NOTE — Progress Notes (Signed)
Subjective:    Patient ID: Shawna Johnson, female    DOB: 1965-09-20, 49 y.o.   MRN: [id number]  HPI  06/2014 note Assessment & Plan:  Elevated alk phos will recheck with GGT and ANA further management based on results  Elevated vitamin D Pt has reduced her dose ot 800 IU       [place] is here for follow up of elevated alk phos.  U/S of 1/ 6 shows no mass hepatic steatosis.  GGT and ANA all negative    Pt asymptomatic  Recent colonoscopy done Dr. Hilarie Fredrickson.    Allergies  Allergen Reactions  . Sulfa Antibiotics Swelling   Past Medical History  Diagnosis Date  . Hyperlipidemia   . Arthritis    Past Surgical History  Procedure Laterality Date  . Cervical conization w/bx  05/2006  . Inner ear surgery      several childhood   History   Social History  . Marital Status: Married    Spouse Name: N/A  . Number of Children: N/A  . Years of Education: N/A   Occupational History  . Not on file.   Social History Main Topics  . Smoking status: Current Every Day Smoker    Types: E-cigarettes  . Smokeless tobacco: Never Used     Comment: is using the e-cigarette  . Alcohol Use: Yes     Comment: Rare  . Drug Use: No  . Sexual Activity:    Partners: Male   Other Topics Concern  . Not on file   Social History Narrative   Family History  Problem Relation Age of Onset  . Heart attack Father   . Hyperlipidemia Father   . Hypertension Father   . Sudden death Father   . Breast cancer Maternal Aunt   . Congestive Heart Failure Sister   . Colon cancer Neg Hx   . Esophageal cancer Neg Hx   . Stomach cancer Neg Hx   . Rectal cancer Neg Hx    Patient Active Problem List   Diagnosis Date Noted  . Multiple thyroid nodules  see U/S 2015 mm sized in left lobe 05/16/2014  . Breast calcifications on mammogram  R breast stable for 18 months [facility] 01/07/2014  . Breast nodule  decreasing size mm 09/2013 [facility] 01/07/2014    . 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 Abnormal Pap smear of cervix  S/P Leep normal pap since 2007 12/22/2013  . Hyperlipidemia 12/22/2013  . Electronic cigarette use 12/22/2013  . Left shoulder pain 10/08/2013  . Left hip pain 10/08/2013  . Right hip pain 10/08/2013   Current Outpatient Prescriptions on File Prior to Visit  Medication Sig Dispense Refill  . atorvastatin (LIPITOR) 10 MG tablet Take 10 mg by mouth daily.    . Multiple Vitamins-Minerals (MULTIVITAMIN PO) Take by mouth daily.     No current facility-administered medications on file prior to visit.      Review of Systems See HPI    Objective:   Physical Exam  Physical Exam  Nursing note and vitals reviewed.  Constitutional: She is oriented to person, place, and time. She appears well-developed and well-nourished.  HENT:  Head: Normocephalic and atraumatic.  Cardiovascular: Normal rate and regular rhythm. Exam reveals no gallop and no friction rub.  No murmur heard.  Pulmonary/Chest: Breath sounds normal. She has no wheezes. She has no rales.  Neurological: She is alert and oriented to person, place, and time.  Skin: Skin is  warm and dry.  Psychiatric: She has a normal mood and affect. Her behavior is normal.             Assessment & Plan:  Elevated alk phos.  Will recheck today .  If still elevated will get GI opinion if furthere work up needed

## 2014-08-25 ENCOUNTER — Encounter: Payer: Self-pay | Admitting: Internal Medicine

## 2014-08-25 ENCOUNTER — Telehealth: Payer: Self-pay | Admitting: Internal Medicine

## 2014-08-25 DIAGNOSIS — R748 Abnormal levels of other serum enzymes: Secondary | ICD-10-CM

## 2014-08-25 NOTE — Telephone Encounter (Signed)
Spoke with pt and informed of alk phos.  She has seen Dr. Hilarie Fredrickson in past  Will make referral to him

## 2014-09-29 ENCOUNTER — Encounter: Payer: Self-pay | Admitting: *Deleted

## 2014-10-14 ENCOUNTER — Encounter: Payer: Self-pay | Admitting: Internal Medicine

## 2014-10-14 ENCOUNTER — Ambulatory Visit (INDEPENDENT_AMBULATORY_CARE_PROVIDER_SITE_OTHER): Payer: No Typology Code available for payment source | Admitting: Internal Medicine

## 2014-10-14 VITALS — BP 100/70 | HR 60 | Ht 62.5 in | Wt 146.4 lb

## 2014-10-14 DIAGNOSIS — R748 Abnormal levels of other serum enzymes: Secondary | ICD-10-CM

## 2014-10-14 DIAGNOSIS — K760 Fatty (change of) liver, not elsewhere classified: Secondary | ICD-10-CM | POA: Diagnosis not present

## 2014-10-14 DIAGNOSIS — Z1211 Encounter for screening for malignant neoplasm of colon: Secondary | ICD-10-CM | POA: Diagnosis not present

## 2014-10-14 NOTE — Patient Instructions (Signed)
Your physician has requested that you go to the basement for the following lab work before leaving today: Hepatic function, Celiac Panel, AMA, ANA, Antismooth muscle antibody, 5NT, Fractionated alkaline phosphatase  You have been scheduled for an appointment with Dr Willette Alma on Monday, 04/25/15 @ 9:30 am. Please arrive at 9:20 am for registration. Make sure to bring insurance cards and medication list as well as any copay you may have.  Their information is as follows: [employer] [facility]  [address]  [place]  [place], [place] [address]  Main: [phone]

## 2014-10-14 NOTE — Progress Notes (Signed)
Patient ID: Shawna Johnson, female   DOB: [birth year], 49 y.o.   MRN: [id number] HPI: Shawna Johnson is a 49 yo female known to me from screening colonoscopy earlier this year with a past medical history of fatty liver, hyperlipidemia who is seen in consultation at the request of Dr. Coralyn Johnson to evaluate elevated isolated alkaline phosphatase. She is here alone today. She reports she feels very well. She denies abdominal complaints or hepatobiliary complaints. No prior history of jaundice, lower extremity edema, ascites, itching. No family history of liver disease. No abdominal pain. No nausea or vomiting. Good appetite. No early satiety. Normal bowel movements without blood in her stool or melena. She does take atorvastatin daily as well as a multivitamin. She drinks alcohol approximately once per month but is never a heavy alcohol drinker or daily user. No IV drug use. No high-risk sexual behavior. She is married with one child.  Her alkaline phosphatase has been elevated though slightly on several occasions. She had a negative viral hepatitis panel. Abdominal ultrasound was performed in January 2016 which was reviewed by me and revealed increased echogenicity of the liver suggestive of fatty infiltration. No gallstones were seen. The pancreas was poorly visualized secondary to bowel gas. Spleen was normal in appearance and size. Kidneys appear normal.   Past Medical History  Diagnosis Date  . Hyperlipidemia   . Arthritis   . Fatty liver   . Elevated alkaline phosphatase level     Past Surgical History  Procedure Laterality Date  . Cervical conization w/bx  05/2006  . Inner ear surgery      several childhood    Outpatient Prescriptions Prior to Visit  Medication Sig Dispense Refill  . atorvastatin (LIPITOR) 10 MG tablet Take 10 mg by mouth daily.    . Multiple Vitamins-Minerals (MULTIVITAMIN PO) Take by mouth daily.     No facility-administered medications prior to visit.     Allergies  Allergen Reactions  . Sulfa Antibiotics Swelling    Family History  Problem Relation Age of Onset  . Heart attack Father   . Hyperlipidemia Father   . Hypertension Father   . Sudden death Father   . Breast cancer Maternal Aunt   . Congestive Heart Failure Sister   . Colon cancer Neg Hx   . Esophageal cancer Neg Hx   . Stomach cancer Neg Hx   . Rectal cancer Neg Hx     History  Substance Use Topics  . Smoking status: Former Smoker    Types: E-cigarettes  . Smokeless tobacco: Never Used     Comment: is using the e-cigarette  . Alcohol Use: 0.0 oz/week    0 Standard drinks or equivalent per week     Comment: Rare    ROS: As per history of present illness, otherwise negative  BP 100/70 mmHg  Pulse 60  Ht 5' 2.5" (1.588 m)  Wt 146 lb 6.4 oz (66.407 kg)  BMI 26.33 kg/m2 Constitutional: Well-developed and well-nourished. No distress. HEENT: Normocephalic and atraumatic. Oropharynx is clear and moist. No oropharyngeal exudate. Conjunctivae are normal.  No scleral icterus. Neck: Neck supple. Trachea midline. Cardiovascular: Normal rate, regular rhythm and intact distal pulses. No M/R/G Pulmonary/chest: Effort normal and breath sounds normal. No wheezing, rales or rhonchi. Abdominal: Soft, nontender, nondistended. Bowel sounds active throughout. There are no masses palpable. No hepatosplenomegaly. Extremities: no clubbing, cyanosis, or edema Lymphadenopathy: No cervical adenopathy noted. Neurological: Alert and oriented to person place and time. Skin: Skin is warm and dry.  No rashes noted. Psychiatric: Normal mood and affect. Behavior is normal.  RELEVANT LABS AND IMAGING: CBC    Component Value Date/Time   WBC 7.7 04/26/2014 1204   RBC 4.57 04/26/2014 1204   HGB 13.6 04/26/2014 1204   HCT 40.0 04/26/2014 1204   PLT 378 04/26/2014 1204   MCV 87.5 04/26/2014 1204   MCH 29.8 04/26/2014 1204   MCHC 34.0 04/26/2014 1204   RDW 13.9 04/26/2014 1204    LYMPHSABS 3.2 04/26/2014 1204   MONOABS 0.7 04/26/2014 1204   EOSABS 0.2 04/26/2014 1204   BASOSABS 0.1 04/26/2014 1204    CMP     Component Value Date/Time   NA 140 06/08/2014 1146   K 4.4 06/08/2014 1146   CL 103 06/08/2014 1146   CO2 26 06/08/2014 1146   GLUCOSE 91 06/08/2014 1146   BUN 15 06/08/2014 1146   CREATININE 0.77 06/08/2014 1146   CALCIUM 9.3 06/08/2014 1146   PROT 6.5 08/23/2014 1155   ALBUMIN 4.2 08/23/2014 1155   AST 15 08/23/2014 1155   ALT 20 08/23/2014 1155   ALKPHOS 132* 08/23/2014 1155   BILITOT 0.4 08/23/2014 1155   GFRNONAA 73 04/26/2014 1204   GFRAA 84 04/26/2014 1204   Results for Shawna Johnson (MRN [id number]) as of 10/14/2014 18:13  Ref. Range 04/26/2014 12:04 06/08/2014 11:46 08/23/2014 11:55  Alkaline Phosphatase Latest Ref Range: 39-117 U/L 128 (H) 130 (H) 132 (H)  Albumin Latest Ref Range: 3.5-5.2 g/dL 4.4 4.3 4.2  AST Latest Ref Range: 0-37 U/L 14 17 15   ALT Latest Ref Range: 0-35 U/L 14 18 20   Total Protein Latest Ref Range: 6.0-8.3 g/dL 6.9 6.3 6.5  Bilirubin, Direct Latest Ref Range: 0.0-0.3 mg/dL   0.1  Indirect Bilirubin Latest Ref Range: 0.2-1.2 mg/dL   0.3  Total Bilirubin Latest Ref Range: 0.2-1.2 mg/dL 0.4 0.5 0.4  GGT Latest Ref Range: 7-51 U/L  20    Lab Results  Component Value Date   ANA NEG 06/08/2014    ASSESSMENT/PLAN: 49 yo female known to me from screening colonoscopy earlier this year with a past medical history of fatty liver, hyperlipidemia who is seen in consultation at the request of Dr. Coralyn Johnson to evaluate elevated isolated alkaline phosphatase  1. Isolated elevated alk phos -- very slight elevation in serum alkaline phosphatase phosphatase. GGT normal. It is unlikely that this represents clinically significant liver dysfunction and it may be her alkaline phosphatase elevation was not related to liver at all. We discussed that alkaline phosphatase is also found in bone, the intestinal tract and can be elevated  postprandially. No evidence of liver dysfunction or chronic liver disease. I recommended additional labs for completeness to include repeat liver enzymes, IgG, antimitochondrial and smooth muscle antibodies, 5-NT. This enzyme elevation can be watched and if further increasing may need to see endocrinology for evaluation for bone disease. We discussed this together and she is comfortable with monitoring and additional labs today.  2. Fatty liver -- seen by ultrasound without elevation in serum transaminases. Suspicion for steatohepatitis very low. This can be monitored given normal liver enzymes. Elevated alk phosphatase is not felt secondary to fatty liver. Discussed the importance of diet and exercise though her weight today is felt to be healthy for her  3. CRC screening -- colonoscopy early 2016 without adenomatous polyps. Repeat in 10 years    HY:[id number] D [address], [place] [place], [place] [address]

## 2014-10-19 ENCOUNTER — Other Ambulatory Visit: Payer: Self-pay | Admitting: *Deleted

## 2014-10-19 ENCOUNTER — Other Ambulatory Visit: Payer: Self-pay | Admitting: Internal Medicine

## 2014-10-19 ENCOUNTER — Other Ambulatory Visit (INDEPENDENT_AMBULATORY_CARE_PROVIDER_SITE_OTHER): Payer: No Typology Code available for payment source

## 2014-10-19 DIAGNOSIS — R748 Abnormal levels of other serum enzymes: Secondary | ICD-10-CM | POA: Diagnosis not present

## 2014-10-19 LAB — IGA: IGA: 168 mg/dL (ref 68–378)

## 2014-10-19 LAB — HEPATIC FUNCTION PANEL
ALK PHOS: 146 U/L — AB (ref 39–117)
ALT: 16 U/L (ref 0–35)
AST: 15 U/L (ref 0–37)
Albumin: 4.2 g/dL (ref 3.5–5.2)
BILIRUBIN DIRECT: 0 mg/dL (ref 0.0–0.3)
BILIRUBIN TOTAL: 0.4 mg/dL (ref 0.2–1.2)
Total Protein: 6.8 g/dL (ref 6.0–8.3)

## 2014-10-19 NOTE — Telephone Encounter (Signed)
Pt called in wanting 90 day supply refill on atorvastatin to hold her over until she get established with new provider.

## 2014-10-20 ENCOUNTER — Other Ambulatory Visit: Payer: Self-pay | Admitting: Internal Medicine

## 2014-10-20 LAB — ALKALINE PHOSPHATASE, ISOENZYMES
ALK PHOS: 152 IU/L — AB (ref 39–117)
BONE FRACTION: 11 % — ABNORMAL LOW (ref 14–68)
INTESTINAL FRAC.: 0 % (ref 0–18)
LIVER FRACTION: 89 % — AB (ref 18–85)

## 2014-10-20 LAB — TISSUE TRANSGLUTAMINASE, IGA: Tissue Transglutaminase Ab, IgA: 1 U/mL (ref <4)

## 2014-10-20 LAB — ANA: Anti Nuclear Antibody(ANA): NEGATIVE

## 2014-10-20 MED ORDER — ATORVASTATIN CALCIUM 10 MG PO TABS: 10.0000 mg | ORAL_TABLET | Freq: Every day | ORAL | Status: DC

## 2014-10-21 LAB — MITOCHONDRIAL ANTIBODIES: Mitochondrial M2 Ab, IgG: 0.69 (ref <0.91)

## 2014-10-22 LAB — ANTI-SMOOTH MUSCLE ANTIBODY, IGG: SMOOTH MUSCLE AB: 5 U (ref <20)

## 2014-10-23 LAB — NUCLEOTIDASE, 5', BLOOD: 5-Nucleotidase: 5 U/L (ref <11)

## 2014-10-26 ENCOUNTER — Other Ambulatory Visit: Payer: Self-pay

## 2014-10-26 DIAGNOSIS — R7989 Other specified abnormal findings of blood chemistry: Secondary | ICD-10-CM

## 2014-10-26 DIAGNOSIS — R945 Abnormal results of liver function studies: Principal | ICD-10-CM

## 2014-11-17 ENCOUNTER — Encounter: Payer: Self-pay | Admitting: Internal Medicine

## 2015-04-11 ENCOUNTER — Telehealth: Payer: Self-pay

## 2015-04-11 NOTE — Telephone Encounter (Signed)
Pt aware.

## 2015-04-11 NOTE — Telephone Encounter (Signed)
-----   Message from Algernon Huxley, RN sent at 10/26/2014  4:12 PM EDT ----- Regarding: labs Needs labs, orders in epic

## 2015-04-14 ENCOUNTER — Other Ambulatory Visit (INDEPENDENT_AMBULATORY_CARE_PROVIDER_SITE_OTHER): Payer: BLUE CROSS/BLUE SHIELD

## 2015-04-14 DIAGNOSIS — R945 Abnormal results of liver function studies: Principal | ICD-10-CM

## 2015-04-14 DIAGNOSIS — R7989 Other specified abnormal findings of blood chemistry: Secondary | ICD-10-CM

## 2015-04-14 LAB — HEPATIC FUNCTION PANEL
ALT: 16 U/L (ref 0–35)
AST: 14 U/L (ref 0–37)
Albumin: 4.4 g/dL (ref 3.5–5.2)
Alkaline Phosphatase: 133 U/L — ABNORMAL HIGH (ref 39–117)
BILIRUBIN DIRECT: 0 mg/dL (ref 0.0–0.3)
Total Bilirubin: 0.4 mg/dL (ref 0.2–1.2)
Total Protein: 7.1 g/dL (ref 6.0–8.3)

## 2015-04-14 LAB — ALKALINE PHOSPHATASE: Alkaline Phosphatase: 133 U/L — ABNORMAL HIGH (ref 39–117)

## 2015-04-15 ENCOUNTER — Other Ambulatory Visit: Payer: Self-pay

## 2015-04-15 DIAGNOSIS — R7989 Other specified abnormal findings of blood chemistry: Secondary | ICD-10-CM

## 2015-04-15 DIAGNOSIS — R945 Abnormal results of liver function studies: Principal | ICD-10-CM

## 2015-04-25 ENCOUNTER — Ambulatory Visit (INDEPENDENT_AMBULATORY_CARE_PROVIDER_SITE_OTHER): Payer: BLUE CROSS/BLUE SHIELD | Admitting: Family Medicine

## 2015-04-25 ENCOUNTER — Encounter: Payer: Self-pay | Admitting: Family Medicine

## 2015-04-25 VITALS — BP 112/76 | HR 70 | Temp 98.0°F | Ht 62.5 in | Wt 144.0 lb

## 2015-04-25 DIAGNOSIS — N63 Unspecified lump in unspecified breast: Secondary | ICD-10-CM

## 2015-04-25 DIAGNOSIS — Z23 Encounter for immunization: Secondary | ICD-10-CM

## 2015-04-25 DIAGNOSIS — Z1239 Encounter for other screening for malignant neoplasm of breast: Secondary | ICD-10-CM | POA: Diagnosis not present

## 2015-04-25 DIAGNOSIS — K760 Fatty (change of) liver, not elsewhere classified: Secondary | ICD-10-CM

## 2015-04-25 DIAGNOSIS — E785 Hyperlipidemia, unspecified: Secondary | ICD-10-CM

## 2015-04-25 DIAGNOSIS — M1990 Unspecified osteoarthritis, unspecified site: Secondary | ICD-10-CM | POA: Insufficient documentation

## 2015-04-25 NOTE — Patient Instructions (Addendum)
[email or web address], Probiotics daily 10 strain version [employer] or [employer] [facility]   Preventive Care for Adults, Female A healthy lifestyle and preventive care can promote health and wellness. Preventive health guidelines for women include the following key practices.  A routine yearly physical is a good way to check with your health care provider about your health and preventive screening. It is a chance to share any concerns and updates on your health and to receive a thorough exam.  Visit your dentist for a routine exam and preventive care every 6 months. Brush your teeth twice a day and floss once a day. Good oral hygiene prevents tooth decay and gum disease.  The frequency of eye exams is based on your age, health, family medical history, use of contact lenses, and other factors. Follow your health care provider's recommendations for frequency of eye exams.  Eat a healthy diet. Foods like vegetables, fruits, whole grains, low-fat dairy products, and lean protein foods contain the nutrients you need without too many calories. Decrease your intake of foods high in solid fats, added sugars, and salt. Eat the right amount of calories for you.Get information about a proper diet from your health care provider, if necessary.  Regular physical exercise is one of the most important things you can do for your health. Most adults should get at least 150 minutes of moderate-intensity exercise (any activity that increases your heart rate and causes you to sweat) each week. In addition, most adults need muscle-strengthening exercises on 2 or more days a week.  Maintain a healthy weight. The body mass index (BMI) is a screening tool to identify possible weight problems. It provides an estimate of body fat based on height and weight. Your health care provider can find your BMI and can help you achieve or maintain a healthy weight.For adults 20 years and older:  A BMI below  18.5 is considered underweight.  A BMI of 18.5 to 24.9 is normal.  A BMI of 25 to 29.9 is considered overweight.  A BMI of 30 and above is considered obese.  Maintain normal blood lipids and cholesterol levels by exercising and minimizing your intake of saturated fat. Eat a balanced diet with plenty of fruit and vegetables. Blood tests for lipids and cholesterol should begin at age 59 and be repeated every 5 years. If your lipid or cholesterol levels are high, you are over 50, or you are at high risk for heart disease, you may need your cholesterol levels checked more frequently.Ongoing high lipid and cholesterol levels should be treated with medicines if diet and exercise are not working.  If you smoke, find out from your health care provider how to quit. If you do not use tobacco, do not start.  Lung cancer screening is recommended for adults aged 59-80 years who are at high risk for developing lung cancer because of a history of smoking. A yearly low-dose CT scan of the lungs is recommended for people who have at least a 30-pack-year history of smoking and are a current smoker or have quit within the past 15 years. A pack year of smoking is smoking an average of 1 pack of cigarettes a day for 1 year (for example: 1 pack a day for 30 years or 2 packs a day for 15 years). Yearly screening should continue until the smoker has stopped smoking for at least 15 years. Yearly screening should be stopped for people who develop a health problem that would prevent them  from having lung cancer treatment.  If you are pregnant, do not drink alcohol. If you are breastfeeding, be very cautious about drinking alcohol. If you are not pregnant and choose to drink alcohol, do not have more than 1 drink per day. One drink is considered to be 12 ounces (355 mL) of beer, 5 ounces (148 mL) of wine, or 1.5 ounces (44 mL) of liquor.  Avoid use of street drugs. Do not share needles with anyone. Ask for help if you need  support or instructions about stopping the use of drugs.  High blood pressure causes heart disease and increases the risk of stroke. Your blood pressure should be checked at least every 1 to 2 years. Ongoing high blood pressure should be treated with medicines if weight loss and exercise do not work.  If you are 3-29 years old, ask your health care provider if you should take aspirin to prevent strokes.  Diabetes screening is done by taking a blood sample to check your blood glucose level after you have not eaten for a certain period of time (fasting). If you are not overweight and you do not have risk factors for diabetes, you should be screened once every 3 years starting at age 30. If you are overweight or obese and you are 7-26 years of age, you should be screened for diabetes every year as part of your cardiovascular risk assessment.  Breast cancer screening is essential preventive care for women. You should practice "breast self-awareness." This means understanding the normal appearance and feel of your breasts and may include breast self-examination. Any changes detected, no matter how small, should be reported to a health care provider. Women in their 3s and 30s should have a clinical breast exam (CBE) by a health care provider as part of a regular health exam every 1 to 3 years. After age 76, women should have a CBE every year. Starting at age 36, women should consider having a mammogram (breast X-ray test) every year. Women who have a family history of breast cancer should talk to their health care provider about genetic screening. Women at a high risk of breast cancer should talk to their health care providers about having an MRI and a mammogram every year.  Breast cancer gene (BRCA)-related cancer risk assessment is recommended for women who have family members with BRCA-related cancers. BRCA-related cancers include breast, ovarian, tubal, and peritoneal cancers. Having family members with  these cancers may be associated with an increased risk for harmful changes (mutations) in the breast cancer genes BRCA1 and BRCA2. Results of the assessment will determine the need for genetic counseling and BRCA1 and BRCA2 testing.  Your health care provider may recommend that you be screened regularly for cancer of the pelvic organs (ovaries, uterus, and vagina). This screening involves a pelvic examination, including checking for microscopic changes to the surface of your cervix (Pap test). You may be encouraged to have this screening done every 3 years, beginning at age 38.  For women ages 24-65, health care providers may recommend pelvic exams and Pap testing every 3 years, or they may recommend the Pap and pelvic exam, combined with testing for human papilloma virus (HPV), every 5 years. Some types of HPV increase your risk of cervical cancer. Testing for HPV may also be done on women of any age with unclear Pap test results.  Other health care providers may not recommend any screening for nonpregnant women who are considered low risk for pelvic cancer and who  do not have symptoms. Ask your health care provider if a screening pelvic exam is right for you.  If you have had past treatment for cervical cancer or a condition that could lead to cancer, you need Pap tests and screening for cancer for at least 20 years after your treatment. If Pap tests have been discontinued, your risk factors (such as having a new sexual partner) need to be reassessed to determine if screening should resume. Some women have medical problems that increase the chance of getting cervical cancer. In these cases, your health care provider may recommend more frequent screening and Pap tests.  Colorectal cancer can be detected and often prevented. Most routine colorectal cancer screening begins at the age of 33 years and continues through age 32 years. However, your health care provider may recommend screening at an earlier age  if you have risk factors for colon cancer. On a yearly basis, your health care provider may provide home test kits to check for hidden blood in the stool. Use of a small camera at the end of a tube, to directly examine the colon (sigmoidoscopy or colonoscopy), can detect the earliest forms of colorectal cancer. Talk to your health care provider about this at age 3, when routine screening begins. Direct exam of the colon should be repeated every 5-10 years through age 48 years, unless early forms of precancerous polyps or small growths are found.  People who are at an increased risk for hepatitis B should be screened for this virus. You are considered at high risk for hepatitis B if:  You were born in a country where hepatitis B occurs often. Talk with your health care provider about which countries are considered high risk.  Your parents were born in a high-risk country and you have not received a shot to protect against hepatitis B (hepatitis B vaccine).  You have HIV or AIDS.  You use needles to inject street drugs.  You live with, or have sex with, someone who has hepatitis B.  You get hemodialysis treatment.  You take certain medicines for conditions like cancer, organ transplantation, and autoimmune conditions.  Hepatitis C blood testing is recommended for all people born from [birth year] through 1965 and any individual with known risks for hepatitis C.  Practice safe sex. Use condoms and avoid high-risk sexual practices to reduce the spread of sexually transmitted infections (STIs). STIs include gonorrhea, chlamydia, syphilis, trichomonas, herpes, HPV, and human immunodeficiency virus (HIV). Herpes, HIV, and HPV are viral illnesses that have no cure. They can result in disability, cancer, and death.  You should be screened for sexually transmitted illnesses (STIs) including gonorrhea and chlamydia if:  You are sexually active and are younger than 24 years.  You are older than 24 years and  your health care provider tells you that you are at risk for this type of infection.  Your sexual activity has changed since you were last screened and you are at an increased risk for chlamydia or gonorrhea. Ask your health care provider if you are at risk.  If you are at risk of being infected with HIV, it is recommended that you take a prescription medicine daily to prevent HIV infection. This is called preexposure prophylaxis (PrEP). You are considered at risk if:  You are sexually active and do not regularly use condoms or know the HIV status of your partner(s).  You take drugs by injection.  You are sexually active with a partner who has HIV.  Talk with  your health care provider about whether you are at high risk of being infected with HIV. If you choose to begin PrEP, you should first be tested for HIV. You should then be tested every 3 months for as long as you are taking PrEP.  Osteoporosis is a disease in which the bones lose minerals and strength with aging. This can result in serious bone fractures or breaks. The risk of osteoporosis can be identified using a bone density scan. Women ages 3 years and over and women at risk for fractures or osteoporosis should discuss screening with their health care providers. Ask your health care provider whether you should take a calcium supplement or vitamin D to reduce the rate of osteoporosis.  Menopause can be associated with physical symptoms and risks. Hormone replacement therapy is available to decrease symptoms and risks. You should talk to your health care provider about whether hormone replacement therapy is right for you.  Use sunscreen. Apply sunscreen liberally and repeatedly throughout the day. You should seek shade when your shadow is shorter than you. Protect yourself by wearing long sleeves, pants, a wide-brimmed hat, and sunglasses year round, whenever you are outdoors.  Once a month, do a whole body skin exam, using a mirror to  look at the skin on your back. Tell your health care provider of new moles, moles that have irregular borders, moles that are larger than a pencil eraser, or moles that have changed in shape or color.  Stay current with required vaccines (immunizations).  Influenza vaccine. All adults should be immunized every year.  Tetanus, diphtheria, and acellular pertussis (Td, Tdap) vaccine. Pregnant women should receive 1 dose of Tdap vaccine during each pregnancy. The dose should be obtained regardless of the length of time since the last dose. Immunization is preferred during the 27th-36th week of gestation. An adult who has not previously received Tdap or who does not know her vaccine status should receive 1 dose of Tdap. This initial dose should be followed by tetanus and diphtheria toxoids (Td) booster doses every 10 years. Adults with an unknown or incomplete history of completing a 3-dose immunization series with Td-containing vaccines should begin or complete a primary immunization series including a Tdap dose. Adults should receive a Td booster every 10 years.  Varicella vaccine. An adult without evidence of immunity to varicella should receive 2 doses or a second dose if she has previously received 1 dose. Pregnant females who do not have evidence of immunity should receive the first dose after pregnancy. This first dose should be obtained before leaving the health care facility. The second dose should be obtained 4-8 weeks after the first dose.  Human papillomavirus (HPV) vaccine. Females aged 13-26 years who have not received the vaccine previously should obtain the 3-dose series. The vaccine is not recommended for use in pregnant females. However, pregnancy testing is not needed before receiving a dose. If a female is found to be pregnant after receiving a dose, no treatment is needed. In that case, the remaining doses should be delayed until after the pregnancy. Immunization is recommended for any  person with an immunocompromised condition through the age of 56 years if she did not get any or all doses earlier. During the 3-dose series, the second dose should be obtained 4-8 weeks after the first dose. The third dose should be obtained 24 weeks after the first dose and 16 weeks after the second dose.  Zoster vaccine. One dose is recommended for adults aged 81  years or older unless certain conditions are present.  Measles, mumps, and rubella (MMR) vaccine. Adults born before [birth year] generally are considered immune to measles and mumps. Adults born in [birth year] or later should have 1 or more doses of MMR vaccine unless there is a contraindication to the vaccine or there is laboratory evidence of immunity to each of the three diseases. A routine second dose of MMR vaccine should be obtained at least 28 days after the first dose for students attending postsecondary schools, health care workers, or international travelers. People who received inactivated measles vaccine or an unknown type of measles vaccine during 1963-1967 should receive 2 doses of MMR vaccine. People who received inactivated mumps vaccine or an unknown type of mumps vaccine before 1979 and are at high risk for mumps infection should consider immunization with 2 doses of MMR vaccine. For females of childbearing age, rubella immunity should be determined. If there is no evidence of immunity, females who are not pregnant should be vaccinated. If there is no evidence of immunity, females who are pregnant should delay immunization until after pregnancy. Unvaccinated health care workers born before [birth year] who lack laboratory evidence of measles, mumps, or rubella immunity or laboratory confirmation of disease should consider measles and mumps immunization with 2 doses of MMR vaccine or rubella immunization with 1 dose of MMR vaccine.  Pneumococcal 13-valent conjugate (PCV13) vaccine. When indicated, a person who is uncertain of his immunization history  and has no record of immunization should receive the PCV13 vaccine. All adults 73 years of age and older should receive this vaccine. An adult aged 82 years or older who has certain medical conditions and has not been previously immunized should receive 1 dose of PCV13 vaccine. This PCV13 should be followed with a dose of pneumococcal polysaccharide (PPSV23) vaccine. Adults who are at high risk for pneumococcal disease should obtain the PPSV23 vaccine at least 8 weeks after the dose of PCV13 vaccine. Adults older than 51 years of age who have normal immune system function should obtain the PPSV23 vaccine dose at least 1 year after the dose of PCV13 vaccine.  Pneumococcal polysaccharide (PPSV23) vaccine. When PCV13 is also indicated, PCV13 should be obtained first. All adults aged 55 years and older should be immunized. An adult younger than age 35 years who has certain medical conditions should be immunized. Any person who resides in a nursing home or long-term care facility should be immunized. An adult smoker should be immunized. People with an immunocompromised condition and certain other conditions should receive both PCV13 and PPSV23 vaccines. People with human immunodeficiency virus (HIV) infection should be immunized as soon as possible after diagnosis. Immunization during chemotherapy or radiation therapy should be avoided. Routine use of PPSV23 vaccine is not recommended for American Indians, [place] Natives, or people younger than 65 years unless there are medical conditions that require PPSV23 vaccine. When indicated, people who have unknown immunization and have no record of immunization should receive PPSV23 vaccine. One-time revaccination 5 years after the first dose of PPSV23 is recommended for people aged 19-64 years who have chronic kidney failure, nephrotic syndrome, asplenia, or immunocompromised conditions. People who received 1-2 doses of PPSV23 before age 51 years should receive another dose  of PPSV23 vaccine at age 59 years or later if at least 5 years have passed since the previous dose. Doses of PPSV23 are not needed for people immunized with PPSV23 at or after age 33 years.  Meningococcal vaccine. Adults with asplenia or persistent complement  component deficiencies should receive 2 doses of quadrivalent meningococcal conjugate (MenACWY-D) vaccine. The doses should be obtained at least 2 months apart. Microbiologists working with certain meningococcal bacteria, [place] recruits, people at risk during an outbreak, and people who travel to or live in countries with a high rate of meningitis should be immunized. A first-year college student up through age 105 years who is living in a residence hall should receive a dose if she did not receive a dose on or after her 16th birthday. Adults who have certain high-risk conditions should receive one or more doses of vaccine.  Hepatitis A vaccine. Adults who wish to be protected from this disease, have certain high-risk conditions, work with hepatitis A-infected animals, work in hepatitis A research labs, or travel to or work in countries with a high rate of hepatitis A should be immunized. Adults who were previously unvaccinated and who anticipate close contact with an international adoptee during the first 60 days after arrival in the [place] from a country with a high rate of hepatitis A should be immunized.  Hepatitis B vaccine. Adults who wish to be protected from this disease, have certain high-risk conditions, may be exposed to blood or other infectious body fluids, are household contacts or sex partners of hepatitis B positive people, are clients or workers in certain care facilities, or travel to or work in countries with a high rate of hepatitis B should be immunized.  Haemophilus influenzae type b (Hib) vaccine. A previously unvaccinated person with asplenia or sickle cell disease or having a scheduled splenectomy should receive 1 dose  of Hib vaccine. Regardless of previous immunization, a recipient of a hematopoietic stem cell transplant should receive a 3-dose series 6-12 months after her successful transplant. Hib vaccine is not recommended for adults with HIV infection. Preventive Services / Frequency Ages 33 to 11 years  Blood pressure check.** / Every 3-5 years.  Lipid and cholesterol check.** / Every 5 years beginning at age 76.  Clinical breast exam.** / Every 3 years for women in their 27s and 63s.  BRCA-related cancer risk assessment.** / For women who have family members with a BRCA-related cancer (breast, ovarian, tubal, or peritoneal cancers).  Pap test.** / Every 2 years from ages 58 through 11. Every 3 years starting at age 55 through age 39 or 72 with a history of 3 consecutive normal Pap tests.  HPV screening.** / Every 3 years from ages 51 through ages 35 to 45 with a history of 3 consecutive normal Pap tests.  Hepatitis C blood test.** / For any individual with known risks for hepatitis C.  Skin self-exam. / Monthly.  Influenza vaccine. / Every year.  Tetanus, diphtheria, and acellular pertussis (Tdap, Td) vaccine.** / Consult your health care provider. Pregnant women should receive 1 dose of Tdap vaccine during each pregnancy. 1 dose of Td every 10 years.  Varicella vaccine.** / Consult your health care provider. Pregnant females who do not have evidence of immunity should receive the first dose after pregnancy.  HPV vaccine. / 3 doses over 6 months, if 67 and younger. The vaccine is not recommended for use in pregnant females. However, pregnancy testing is not needed before receiving a dose.  Measles, mumps, rubella (MMR) vaccine.** / You need at least 1 dose of MMR if you were born in 1957 or later. You may also need a 2nd dose. For females of childbearing age, rubella immunity should be determined. If there is no evidence of immunity, females  who are not pregnant should be vaccinated. If there is  no evidence of immunity, females who are pregnant should delay immunization until after pregnancy.  Pneumococcal 13-valent conjugate (PCV13) vaccine.** / Consult your health care provider.  Pneumococcal polysaccharide (PPSV23) vaccine.** / 1 to 2 doses if you smoke cigarettes or if you have certain conditions.  Meningococcal vaccine.** / 1 dose if you are age 72 to 54 years and a Market researcher living in a residence hall, or have one of several medical conditions, you need to get vaccinated against meningococcal disease. You may also need additional booster doses.  Hepatitis A vaccine.** / Consult your health care provider.  Hepatitis B vaccine.** / Consult your health care provider.  Haemophilus influenzae type b (Hib) vaccine.** / Consult your health care provider. Ages 77 to 71 years  Blood pressure check.** / Every year.  Lipid and cholesterol check.** / Every 5 years beginning at age 58 years.  Lung cancer screening. / Every year if you are aged 31-80 years and have a 30-pack-year history of smoking and currently smoke or have quit within the past 15 years. Yearly screening is stopped once you have quit smoking for at least 15 years or develop a health problem that would prevent you from having lung cancer treatment.  Clinical breast exam.** / Every year after age 59 years.  BRCA-related cancer risk assessment.** / For women who have family members with a BRCA-related cancer (breast, ovarian, tubal, or peritoneal cancers).  Mammogram.** / Every year beginning at age 41 years and continuing for as long as you are in good health. Consult with your health care provider.  Pap test.** / Every 3 years starting at age 27 years through age 1 or 50 years with a history of 3 consecutive normal Pap tests.  HPV screening.** / Every 3 years from ages 81 years through ages 61 to 50 years with a history of 3 consecutive normal Pap tests.  Fecal occult blood test (FOBT) of stool. /  Every year beginning at age 6 years and continuing until age 95 years. You may not need to do this test if you get a colonoscopy every 10 years.  Flexible sigmoidoscopy or colonoscopy.** / Every 5 years for a flexible sigmoidoscopy or every 10 years for a colonoscopy beginning at age 75 years and continuing until age 41 years.  Hepatitis C blood test.** / For all people born from [birth year] through 1967 and any individual with known risks for hepatitis C.  Skin self-exam. / Monthly.  Influenza vaccine. / Every year.  Tetanus, diphtheria, and acellular pertussis (Tdap/Td) vaccine.** / Consult your health care provider. Pregnant women should receive 1 dose of Tdap vaccine during each pregnancy. 1 dose of Td every 10 years.  Varicella vaccine.** / Consult your health care provider. Pregnant females who do not have evidence of immunity should receive the first dose after pregnancy.  Zoster vaccine.** / 1 dose for adults aged 62 years or older.  Measles, mumps, rubella (MMR) vaccine.** / You need at least 1 dose of MMR if you were born in 1957 or later. You may also need a second dose. For females of childbearing age, rubella immunity should be determined. If there is no evidence of immunity, females who are not pregnant should be vaccinated. If there is no evidence of immunity, females who are pregnant should delay immunization until after pregnancy.  Pneumococcal 13-valent conjugate (PCV13) vaccine.** / Consult your health care provider.  Pneumococcal polysaccharide (PPSV23) vaccine.** / 1  to 2 doses if you smoke cigarettes or if you have certain conditions.  Meningococcal vaccine.** / Consult your health care provider.  Hepatitis A vaccine.** / Consult your health care provider.  Hepatitis B vaccine.** / Consult your health care provider.  Haemophilus influenzae type b (Hib) vaccine.** / Consult your health care provider. Ages 33 years and over  Blood pressure check.** / Every year.  Lipid  and cholesterol check.** / Every 5 years beginning at age 25 years.  Lung cancer screening. / Every year if you are aged 67-80 years and have a 30-pack-year history of smoking and currently smoke or have quit within the past 15 years. Yearly screening is stopped once you have quit smoking for at least 15 years or develop a health problem that would prevent you from having lung cancer treatment.  Clinical breast exam.** / Every year after age 39 years.  BRCA-related cancer risk assessment.** / For women who have family members with a BRCA-related cancer (breast, ovarian, tubal, or peritoneal cancers).  Mammogram.** / Every year beginning at age 56 years and continuing for as long as you are in good health. Consult with your health care provider.  Pap test.** / Every 3 years starting at age 22 years through age 74 or 48 years with 3 consecutive normal Pap tests. Testing can be stopped between 65 and 70 years with 3 consecutive normal Pap tests and no abnormal Pap or HPV tests in the past 10 years.  HPV screening.** / Every 3 years from ages 69 years through ages 76 or 69 years with a history of 3 consecutive normal Pap tests. Testing can be stopped between 65 and 70 years with 3 consecutive normal Pap tests and no abnormal Pap or HPV tests in the past 10 years.  Fecal occult blood test (FOBT) of stool. / Every year beginning at age 43 years and continuing until age 59 years. You may not need to do this test if you get a colonoscopy every 10 years.  Flexible sigmoidoscopy or colonoscopy.** / Every 5 years for a flexible sigmoidoscopy or every 10 years for a colonoscopy beginning at age 78 years and continuing until age 88 years.  Hepatitis C blood test.** / For all people born from [birth year] through 1965 and any individual with known risks for hepatitis C.  Osteoporosis screening.** / A one-time screening for women ages 71 years and over and women at risk for fractures or osteoporosis.  Skin  self-exam. / Monthly.  Influenza vaccine. / Every year.  Tetanus, diphtheria, and acellular pertussis (Tdap/Td) vaccine.** / 1 dose of Td every 10 years.  Varicella vaccine.** / Consult your health care provider.  Zoster vaccine.** / 1 dose for adults aged 62 years or older.  Pneumococcal 13-valent conjugate (PCV13) vaccine.** / Consult your health care provider.  Pneumococcal polysaccharide (PPSV23) vaccine.** / 1 dose for all adults aged 32 years and older.  Meningococcal vaccine.** / Consult your health care provider.  Hepatitis A vaccine.** / Consult your health care provider.  Hepatitis B vaccine.** / Consult your health care provider.  Haemophilus influenzae type b (Hib) vaccine.** / Consult your health care provider. ** Family history and personal history of risk and conditions may change your health care provider's recommendations.   This information is not intended to replace advice given to you by your health care provider. Make sure you discuss any questions you have with your health care provider.   Document Released: 08/14/2001 Document Revised: 07/09/2014 Document Reviewed: 11/13/2010 Elsevier Interactive  Patient Education 2016 [employer].

## 2015-04-25 NOTE — Progress Notes (Signed)
Pre visit review using our clinic review tool, if applicable. No additional management support is needed unless otherwise documented below in the visit note. 

## 2015-04-26 ENCOUNTER — Ambulatory Visit (HOSPITAL_BASED_OUTPATIENT_CLINIC_OR_DEPARTMENT_OTHER)
Admission: RE | Admit: 2015-04-26 | Discharge: 2015-04-26 | Disposition: A | Discharge status: Home / Self Care | Payer: BLUE CROSS/BLUE SHIELD | Source: Ambulatory Visit | Attending: Family Medicine | Admitting: Family Medicine

## 2015-04-26 DIAGNOSIS — Z1239 Encounter for other screening for malignant neoplasm of breast: Secondary | ICD-10-CM

## 2015-04-26 DIAGNOSIS — Z1231 Encounter for screening mammogram for malignant neoplasm of breast: Secondary | ICD-10-CM | POA: Insufficient documentation

## 2015-05-08 NOTE — Assessment & Plan Note (Signed)
MGM ordered today, patient without complaints

## 2015-05-08 NOTE — Assessment & Plan Note (Signed)
Encouraged heart healthy diet, increase exercise, avoid trans fats, consider a krill oil cap daily. Tolerating Atorvastatin 

## 2015-05-08 NOTE — Assessment & Plan Note (Addendum)
Minimize simple carbs and fatty foods, increase exercise and keep weight down. Mild IBS type symptoms, minimize fatty foods, increase fiber and add probiotics

## 2015-05-08 NOTE — Progress Notes (Signed)
Subjective:    Patient ID: Shawna Johnson, female    DOB: 28-Feb-1966, 49 y.o.   MRN: [id number]  Chief Complaint  Patient presents with  . Establish Care    HPI Patient is in today for new patient appointment. 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 Fatty liver disease, breast nodules, thyroid nodules, joint pain including hips and shoulders. Is also reporting a history of a ruptured eardrum in right ear and decreased hearing on right as a result. This history is old and there are no acute complaints in this regard. Denies CP/palp/SOB/HA/congestion/fevers/GI or GU c/o. Taking meds as prescribed  Past Medical History  Diagnosis Date  . Hyperlipidemia   . Fatty liver   . Elevated alkaline phosphatase level   . Arthritis     Past Surgical History  Procedure Laterality Date  . Cervical conization w/bx  05/2006  . Inner ear surgery      several childhood  . Tonsillectomy and adenoidectomy      Family History  Problem Relation Age of Onset  . Heart attack Father   . Hyperlipidemia Father   . Hypertension Father   . Sudden death Father   . Heart disease Father     MI  . Alcohol abuse Father   . Breast cancer Maternal Aunt   . Congestive Heart Failure Sister   . COPD Sister   . Heart disease Sister     has pacer/defib in place   . Cardiomyopathy Sister   . Drug abuse Sister     crack in past  . Colon cancer Neg Hx   . Esophageal cancer Neg Hx   . Stomach cancer Neg Hx   . Rectal cancer Neg Hx   . Mental illness Maternal Grandfather     suicide  . Heart disease Paternal Grandmother   . Heart disease Paternal Grandfather     Social History   Social History  . Marital Status: Married    Spouse Name: N/A  . Number of Children: 1  . Years of Education: N/A   Occupational History  . Stylist    Social History Main Topics  . Smoking status: Former Smoker    Types: E-cigarettes  .  Smokeless tobacco: Never Used  . Alcohol Use: 0.0 oz/week    0 Standard drinks or equivalent per week     Comment: Rare  . Drug Use: No  . 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 Not on file   Social History Narrative    Outpatient Prescriptions Prior to Visit  Medication Sig Dispense Refill  . atorvastatin (LIPITOR) 10 MG tablet Take 1 tablet (10 mg total) by mouth daily. 90 tablet 1  . Multiple Vitamins-Minerals (MULTIVITAMIN PO) Take by mouth daily.     No facility-administered medications prior to visit.    Allergies  Allergen Reactions  . Sulfa Antibiotics Swelling    Review of Systems  Constitutional: Negative for fever and malaise/fatigue.  HENT: Negative for congestion.   Eyes: Negative for discharge.  Respiratory: Negative for shortness of breath.   Cardiovascular: Negative for chest pain, palpitations and leg swelling.  Gastrointestinal: Positive for diarrhea. Negative for nausea and abdominal pain.  Genitourinary: Negative for dysuria.  Musculoskeletal: Positive for joint pain. Negative for falls.  Skin:  Negative for rash.  Neurological: Negative for loss of consciousness and headaches.  Endo/Heme/Allergies: Negative for environmental allergies.  Psychiatric/Behavioral: Negative for depression. The patient is not nervous/anxious.        Objective:    Physical Exam  Constitutional: She is oriented to person, place, and time. She appears well-developed and well-nourished. No distress.  HENT:  Head: Normocephalic and atraumatic.  TMs dull and scarred  Eyes: Conjunctivae are normal.  Neck: Neck supple. No thyromegaly present.  Cardiovascular: Normal rate, regular rhythm and normal heart sounds.   No murmur heard. Pulmonary/Chest: Effort normal and breath sounds normal. No respiratory distress.  Abdominal: Soft. Bowel sounds are  normal. She exhibits no distension and no mass. There is no tenderness.  Musculoskeletal: She exhibits no edema.  Lymphadenopathy:    She has no cervical adenopathy.  Neurological: She is alert and oriented to person, place, and time.  Skin: Skin is warm and dry.  Psychiatric: She has a normal mood and affect. Her behavior is normal.    BP 112/76 mmHg  Pulse 70  Temp(Src) 98 F (36.7 C) (Oral)  Ht 5' 2.5" (1.588 m)  Wt 144 lb (65.318 kg)  BMI 25.90 kg/m2  SpO2 100% Wt Readings from Last 3 Encounters:  04/25/15 144 lb (65.318 kg)  10/14/14 146 lb 6.4 oz (66.407 kg)  08/23/14 146 lb (66.225 kg)     Lab Results  Component Value Date   WBC 7.7 04/26/2014   HGB 13.6 04/26/2014   HCT 40.0 04/26/2014   PLT 378 04/26/2014   GLUCOSE 91 06/08/2014   CHOL 177 04/26/2014   TRIG 125 04/26/2014   HDL 55 04/26/2014   LDLCALC 97 04/26/2014   ALT 16 04/14/2015   AST 14 04/14/2015   NA 140 06/08/2014   K 4.4 06/08/2014   CL 103 06/08/2014   CREATININE 0.77 06/08/2014   BUN 15 06/08/2014   CO2 26 06/08/2014   TSH 0.947 04/26/2014    Lab Results  Component Value Date   TSH 0.947 04/26/2014   Lab Results  Component Value Date   WBC 7.7 04/26/2014   HGB 13.6 04/26/2014   HCT 40.0 04/26/2014   MCV 87.5 04/26/2014   PLT 378 04/26/2014   Lab Results  Component Value Date   NA 140 06/08/2014   K 4.4 06/08/2014   CO2 26 06/08/2014   GLUCOSE 91 06/08/2014   BUN 15 06/08/2014   CREATININE 0.77 06/08/2014   BILITOT 0.4 04/14/2015   ALKPHOS 133* 04/14/2015   ALKPHOS 133* 04/14/2015   AST 14 04/14/2015   ALT 16 04/14/2015   PROT 7.1 04/14/2015   ALBUMIN 4.4 04/14/2015   CALCIUM 9.3 06/08/2014   Lab Results  Component Value Date   CHOL 177 04/26/2014   Lab Results  Component Value Date   HDL 55 04/26/2014   Lab Results  Component Value Date   LDLCALC 97 04/26/2014   Lab Results  Component Value Date   TRIG 125 04/26/2014   Lab Results  Component Value Date     CHOLHDL 3.2 04/26/2014   No results found for: HGBA1C     Assessment & Plan:   Problem List Items Addressed This Visit    Hyperlipidemia    Encouraged heart healthy diet, increase exercise, avoid trans fats, consider a krill oil cap daily. Tolerating Atorvastatin      Hepatic steatosis    Minimize simple carbs and fatty foods, increase exercise and keep weight down. Mild IBS type symptoms, minimize  fatty foods, increase fiber and add probiotics      Breast nodule  decreasing size mm 09/2013 [facility] ordered today, patient without complaints       Other Visit Diagnoses    Encounter for immunization    -  Primary    Breast cancer screening        Relevant Orders    MM SCREENING BREAST TOMO BILATERAL (Completed)       I am having Ms. Stegmaier maintain her Multiple Vitamins-Minerals (MULTIVITAMIN PO) and atorvastatin.  No orders of the defined types were placed in this encounter.     Penni Homans, MD

## 2015-08-01 ENCOUNTER — Telehealth: Payer: Self-pay | Admitting: Family Medicine

## 2015-08-01 NOTE — Telephone Encounter (Signed)
Caller name: Self  Can be reached: [phone]  Reason for call: Patient requesting a TB Test

## 2015-08-01 NOTE — Telephone Encounter (Signed)
OK she just needs a nurse visit to have a ppd placed and then read in 48 to 72 hours

## 2015-08-01 NOTE — Telephone Encounter (Signed)
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.  They require due to the work there that they require a TB test.  advise

## 2015-08-02 NOTE — Telephone Encounter (Signed)
Scheduled appt.

## 2015-08-04 ENCOUNTER — Ambulatory Visit: Payer: BLUE CROSS/BLUE SHIELD

## 2015-08-09 ENCOUNTER — Ambulatory Visit (INDEPENDENT_AMBULATORY_CARE_PROVIDER_SITE_OTHER): Payer: BLUE CROSS/BLUE SHIELD | Admitting: Behavioral Health

## 2015-08-09 DIAGNOSIS — Z111 Encounter for screening for respiratory tuberculosis: Secondary | ICD-10-CM | POA: Diagnosis not present

## 2015-08-09 NOTE — Progress Notes (Signed)
Pre visit review using our clinic review tool, if applicable. No additional management support is needed unless otherwise documented below in the visit note.  Patient tolerated injection well.  

## 2015-08-11 LAB — TB SKIN TEST
INDURATION: 0 mm
TB SKIN TEST: NEGATIVE

## 2015-10-24 ENCOUNTER — Other Ambulatory Visit (HOSPITAL_COMMUNITY)
Admission: RE | Admit: 2015-10-24 | Discharge: 2015-10-24 | Disposition: A | Discharge status: Home / Self Care | Payer: BLUE CROSS/BLUE SHIELD | Source: Ambulatory Visit | Attending: Family Medicine | Admitting: Family Medicine

## 2015-10-24 ENCOUNTER — Ambulatory Visit (INDEPENDENT_AMBULATORY_CARE_PROVIDER_SITE_OTHER): Payer: BLUE CROSS/BLUE SHIELD | Admitting: Family Medicine

## 2015-10-24 ENCOUNTER — Encounter: Payer: Self-pay | Admitting: Family Medicine

## 2015-10-24 VITALS — BP 108/62 | HR 83 | Temp 98.2°F | Ht 63.0 in | Wt 146.2 lb

## 2015-10-24 DIAGNOSIS — Z23 Encounter for immunization: Secondary | ICD-10-CM

## 2015-10-24 DIAGNOSIS — R87619 Unspecified abnormal cytological findings in specimens from cervix uteri: Secondary | ICD-10-CM

## 2015-10-24 DIAGNOSIS — Z0000 Encounter for general adult medical examination without abnormal findings: Secondary | ICD-10-CM | POA: Insufficient documentation

## 2015-10-24 DIAGNOSIS — E785 Hyperlipidemia, unspecified: Secondary | ICD-10-CM | POA: Diagnosis not present

## 2015-10-24 DIAGNOSIS — Z01419 Encounter for gynecological examination (general) (routine) without abnormal findings: Secondary | ICD-10-CM | POA: Diagnosis not present

## 2015-10-24 DIAGNOSIS — R921 Mammographic calcification found on diagnostic imaging of breast: Secondary | ICD-10-CM

## 2015-10-24 DIAGNOSIS — K760 Fatty (change of) liver, not elsewhere classified: Secondary | ICD-10-CM

## 2015-10-24 DIAGNOSIS — R928 Other abnormal and inconclusive findings on diagnostic imaging of breast: Secondary | ICD-10-CM

## 2015-10-24 HISTORY — DX: Encounter for general adult medical examination without abnormal findings: Z00.00

## 2015-10-24 LAB — COMPREHENSIVE METABOLIC PANEL
ALBUMIN: 4.3 g/dL (ref 3.5–5.2)
ALK PHOS: 133 U/L — AB (ref 39–117)
ALT: 15 U/L (ref 0–35)
AST: 16 U/L (ref 0–37)
BILIRUBIN TOTAL: 0.4 mg/dL (ref 0.2–1.2)
BUN: 12 mg/dL (ref 6–23)
CALCIUM: 9.7 mg/dL (ref 8.4–10.5)
CO2: 30 meq/L (ref 19–32)
Chloride: 103 mEq/L (ref 96–112)
Creatinine, Ser: 0.77 mg/dL (ref 0.40–1.20)
GFR: 83.74 mL/min (ref >60.00)
Glucose, Bld: 99 mg/dL (ref 70–99)
Potassium: 3.9 mEq/L (ref 3.5–5.1)
Sodium: 141 mEq/L (ref 135–145)
Total Protein: 7 g/dL (ref 6.0–8.3)

## 2015-10-24 LAB — LIPID PANEL
CHOL/HDL RATIO: 3
CHOLESTEROL: 192 mg/dL (ref 0–200)
HDL: 57.3 mg/dL (ref >39.00)
LDL Cholesterol: 104 mg/dL — ABNORMAL HIGH (ref 0–99)
NonHDL: 134.86
TRIGLYCERIDES: 152 mg/dL — AB (ref 0.0–149.0)
VLDL: 30.4 mg/dL (ref 0.0–40.0)

## 2015-10-24 LAB — CBC
HCT: 41.1 % (ref 36.0–46.0)
HEMOGLOBIN: 13.5 g/dL (ref 12.0–15.0)
MCHC: 32.8 g/dL (ref 30.0–36.0)
MCV: 88.3 fl (ref 78.0–100.0)
PLATELETS: 385 10*3/uL (ref 150.0–400.0)
RBC: 4.66 Mil/uL (ref 3.87–5.11)
RDW: 14.3 % (ref 11.5–15.5)
WBC: 8.4 10*3/uL (ref 4.0–10.5)

## 2015-10-24 LAB — TSH: TSH: 0.68 u[IU]/mL (ref 0.35–4.50)

## 2015-10-24 NOTE — Progress Notes (Signed)
Patient ID: Shawna Johnson, female   DOB: 1965/07/19, 50 y.o.   MRN: [id number]   Subjective:    Patient ID: Shawna Johnson, female    DOB: 28-May-1966, 50 y.o.   MRN: [id number]  Chief Complaint  Patient presents with  . Annual Exam    HPI Patient is in today for Annual Exam. She is feeling well today. No recent illness or acute complaints. Is exercising more and is eating cleaner diet. Denies CP/palp/SOB/HA/congestion/fevers/GI or GU c/o. Taking meds as prescribed  Past Medical History  Diagnosis Date  . Hyperlipidemia   . Fatty liver   . Elevated alkaline phosphatase level   . Arthritis   . Preventative health care 10/24/2015    Past Surgical History  Procedure Laterality Date  . Cervical conization w/bx  05/2006  . Inner ear surgery      several childhood  . Tonsillectomy and adenoidectomy      Family History  Problem Relation Age of Onset  . Heart attack Father   . Hyperlipidemia Father   . Hypertension Father   . Sudden death Father   . Heart disease Father     MI  . Alcohol abuse Father   . Breast cancer Maternal Aunt   . Congestive Heart Failure Sister   . COPD Sister   . Heart disease Sister     has pacer/defib in place   . Cardiomyopathy Sister   . Drug abuse Sister     crack in past  . Colon cancer Neg Hx   . Esophageal cancer Neg Hx   . Stomach cancer Neg Hx   . Rectal cancer Neg Hx   . Mental illness Maternal Grandfather     suicide  . Heart disease Paternal Grandmother   . Heart disease Paternal Grandfather     Social History   Social History  . Marital Status: Married    Spouse Name: N/A  . Number of Children: 1  . Years of Education: N/A   Occupational History  . Stylist    Social History Main Topics  . Smoking status: Former Smoker    Types: E-cigarettes  . Smokeless tobacco: Never Used  . Alcohol Use: 0.0 oz/week    0 Standard drinks or equivalent per week     Comment: Rare  . Drug Use: No  . 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 Not on file   Social History Narrative    Outpatient Prescriptions Prior to Visit  Medication Sig Dispense Refill  . Multiple Vitamins-Minerals (MULTIVITAMIN PO) Take by mouth daily.    Marland Kitchen atorvastatin (LIPITOR) 10 MG tablet Take 1 tablet (10 mg total) by mouth daily. 90 tablet 1   No facility-administered medications prior to visit.    Allergies  Allergen Reactions  . Sulfa Antibiotics Swelling    Review of Systems  Constitutional: Negative for fever and malaise/fatigue.  HENT: Negative for congestion.   Eyes: Negative for blurred vision.  Respiratory: Negative for shortness of breath.   Cardiovascular: Negative for chest pain, palpitations and leg swelling.  Gastrointestinal: Negative for nausea, abdominal pain and blood in stool.  Genitourinary: Negative for dysuria and frequency.  Musculoskeletal: Negative for falls.  Skin: Negative for rash.  Neurological: Negative for dizziness, loss of consciousness and headaches.  Endo/Heme/Allergies: Negative for environmental allergies.  Psychiatric/Behavioral: Negative for depression. The patient  is not nervous/anxious.        Objective:    Physical Exam  Constitutional: She is oriented to person, place, and time. She appears well-developed and well-nourished. No distress.  HENT:  Head: Normocephalic and atraumatic.  Right Ear: External ear normal.  Left Ear: External ear normal.  Nose: Nose normal.  Mouth/Throat: Oropharynx is clear and moist.  Eyes: Conjunctivae and EOM are normal. Pupils are equal, round, and reactive to light. Right eye exhibits no discharge. Left eye exhibits no discharge.  Neck: Normal range of motion. Neck supple. No JVD present. No thyromegaly present.  Cardiovascular: Normal rate, regular rhythm, normal heart sounds and intact distal pulses.   No murmur  heard. Pulmonary/Chest: Effort normal and breath sounds normal. No respiratory distress. She has no wheezes. She has no rales. She exhibits no tenderness.  Abdominal: Soft. Bowel sounds are normal. She exhibits no distension and no mass. There is no tenderness. There is no rebound and no guarding.  Genitourinary: Vagina normal and uterus normal. Guaiac negative stool. No vaginal discharge found.  Musculoskeletal: Normal range of motion. She exhibits no edema or tenderness.  Lymphadenopathy:    She has no cervical adenopathy.  Neurological: She is alert and oriented to person, place, and time. She has normal reflexes. No cranial nerve deficit.  Skin: Skin is warm and dry. No rash noted. She is not diaphoretic. No erythema.  Psychiatric: She has a normal mood and affect. Her behavior is normal. Judgment and thought content normal.  Nursing note and vitals reviewed.   BP 108/62 mmHg  Pulse 83  Temp(Src) 98.2 F (36.8 C) (Oral)  Ht 5\' 3"  (1.6 m)  Wt 146 lb 4 oz (66.339 kg)  BMI 25.91 kg/m2  SpO2 96% Wt Readings from Last 3 Encounters:  10/24/15 146 lb 4 oz (66.339 kg)  04/25/15 144 lb (65.318 kg)  10/14/14 146 lb 6.4 oz (66.407 kg)     Lab Results  Component Value Date   WBC 8.4 10/24/2015   HGB 13.5 10/24/2015   HCT 41.1 10/24/2015   PLT 385.0 10/24/2015   GLUCOSE 99 10/24/2015   CHOL 192 10/24/2015   TRIG 152.0* 10/24/2015   HDL 57.30 10/24/2015   LDLCALC 104* 10/24/2015   ALT 15 10/24/2015   AST 16 10/24/2015   NA 141 10/24/2015   K 3.9 10/24/2015   CL 103 10/24/2015   CREATININE 0.77 10/24/2015   BUN 12 10/24/2015   CO2 30 10/24/2015   TSH 0.68 10/24/2015    Lab Results  Component Value Date   TSH 0.68 10/24/2015   Lab Results  Component Value Date   WBC 8.4 10/24/2015   HGB 13.5 10/24/2015   HCT 41.1 10/24/2015   MCV 88.3 10/24/2015   PLT 385.0 10/24/2015   Lab Results  Component Value Date   NA 141 10/24/2015   K 3.9 10/24/2015   CO2 30 10/24/2015    GLUCOSE 99 10/24/2015   BUN 12 10/24/2015   CREATININE 0.77 10/24/2015   BILITOT 0.4 10/24/2015   ALKPHOS 133* 10/24/2015   AST 16 10/24/2015   ALT 15 10/24/2015   PROT 7.0 10/24/2015   ALBUMIN 4.3 10/24/2015   CALCIUM 9.7 10/24/2015   GFR 83.74 10/24/2015   Lab Results  Component Value Date   CHOL 192 10/24/2015   Lab Results  Component Value Date   HDL 57.30 10/24/2015   Lab Results  Component Value Date   LDLCALC 104* 10/24/2015   Lab Results  Component Value  Date   TRIG 152.0* 10/24/2015   Lab Results  Component Value Date   CHOLHDL 3 10/24/2015   No results found for: HGBA1C     Assessment & Plan:   Problem List Items Addressed This Visit    Abnormal Pap smear of cervix  S/P Leep normal pap since 2007    Pap today, no concerns on exam.       Relevant Orders   TSH (Completed)   CBC (Completed)   Comprehensive metabolic panel (Completed)   Lipid panel (Completed)   Cytology - PAP (Completed)   Breast calcifications on mammogram  R breast stable for 18 months [facility]   Relevant Orders   TSH (Completed)   CBC (Completed)   Comprehensive metabolic panel (Completed)   Lipid panel (Completed)   Cytology - PAP (Completed)   Hepatic steatosis - Primary    Check liver panel today      Relevant Orders   TSH (Completed)   CBC (Completed)   Comprehensive metabolic panel (Completed)   Lipid panel (Completed)   Cytology - PAP (Completed)   Hyperlipidemia    Patient interested in quitting Lipitor now that she is eating better, exercising and not smoking but she does have a strong FH of heart disease will check FLP before deciding how to proceed      Relevant Medications   atorvastatin (LIPITOR) 10 MG tablet   Preventative health care    Patient encouraged to maintain heart healthy diet, regular exercise, adequate sleep. Consider daily probiotics. Take medications as prescribed. Given and reviewed copy of ACP documents from [employer] and encouraged to complete and return. Labs taken today. Pap today. Follows with Janessa has appt in August. MGM was in October and normal, colonoscopy in November 2016. Tdap given today      Relevant Orders   TSH (Completed)   CBC (Completed)   Comprehensive metabolic panel (Completed)   Lipid panel (Completed)   Cytology - PAP (Completed)    Other Visit Diagnoses    Need for diphtheria-tetanus-pertussis (Tdap) vaccine, adult/adolescent        Relevant Orders    Tdap vaccine greater than or equal to 52yo IM (Completed)       I am having Ms. Milne maintain her Multiple Vitamins-Minerals (MULTIVITAMIN PO) and atorvastatin.  Meds ordered this encounter  Medications  . atorvastatin (LIPITOR) 10 MG tablet    Sig: Take 1 tablet (10 mg total) by mouth daily.    Dispense:  90 tablet    Refill:  1     Penni Homans, MD

## 2015-10-24 NOTE — Progress Notes (Signed)
Pre visit review using our clinic review tool, if applicable. No additional management support is needed unless otherwise documented below in the visit note. 

## 2015-10-24 NOTE — Assessment & Plan Note (Signed)
Check liver panel today

## 2015-10-24 NOTE — Assessment & Plan Note (Signed)
Pap today, no concerns on exam.  

## 2015-10-24 NOTE — Assessment & Plan Note (Addendum)
Patient encouraged to maintain heart healthy diet, regular exercise, adequate sleep. Consider daily probiotics. Take medications as prescribed. Given and reviewed copy of ACP documents from [employer] and encouraged to complete and return. Labs taken today. Pap today. Follows with Leahmarie has appt in August. MGM was in October and normal, colonoscopy in November 2016. Tdap given today

## 2015-10-24 NOTE — Assessment & Plan Note (Signed)
Patient interested in quitting Lipitor now that she is eating better, exercising and not smoking but she does have a strong FH of heart disease will check FLP before deciding how to proceed

## 2015-10-24 NOTE — Patient Instructions (Signed)
Preventive Care for Adults, Female A healthy lifestyle and preventive care can promote health and wellness. Preventive health guidelines for women include the following key practices.  A routine yearly physical is a good way to check with your health care provider about your health and preventive screening. It is a chance to share any concerns and updates on your health and to receive a thorough exam.  Visit your dentist for a routine exam and preventive care every 6 months. Brush your teeth twice a day and floss once a day. Good oral hygiene prevents tooth decay and gum disease.  The frequency of eye exams is based on your age, health, family medical history, use of contact lenses, and other factors. Follow your health care provider's recommendations for frequency of eye exams.  Eat a healthy diet. Foods like vegetables, fruits, whole grains, low-fat dairy products, and lean protein foods contain the nutrients you need without too many calories. Decrease your intake of foods high in solid fats, added sugars, and salt. Eat the right amount of calories for you.Get information about a proper diet from your health care provider, if necessary.  Regular physical exercise is one of the most important things you can do for your health. Most adults should get at least 150 minutes of moderate-intensity exercise (any activity that increases your heart rate and causes you to sweat) each week. In addition, most adults need muscle-strengthening exercises on 2 or more days a week.  Maintain a healthy weight. The body mass index (BMI) is a screening tool to identify possible weight problems. It provides an estimate of body fat based on height and weight. Your health care provider can find your BMI and can help you achieve or maintain a healthy weight.For adults 20 years and older:  A BMI below 18.5 is considered underweight.  A BMI of 18.5 to 24.9 is normal.  A BMI of 25 to 29.9 is considered overweight.  A  BMI of 30 and above is considered obese.  Maintain normal blood lipids and cholesterol levels by exercising and minimizing your intake of saturated fat. Eat a balanced diet with plenty of fruit and vegetables. Blood tests for lipids and cholesterol should begin at age 45 and be repeated every 5 years. If your lipid or cholesterol levels are high, you are over 50, or you are at high risk for heart disease, you may need your cholesterol levels checked more frequently.Ongoing high lipid and cholesterol levels should be treated with medicines if diet and exercise are not working.  If you smoke, find out from your health care provider how to quit. If you do not use tobacco, do not start.  Lung cancer screening is recommended for adults aged 45-80 years who are at high risk for developing lung cancer because of a history of smoking. A yearly low-dose CT scan of the lungs is recommended for people who have at least a 30-pack-year history of smoking and are a current smoker or have quit within the past 15 years. A pack year of smoking is smoking an average of 1 pack of cigarettes a day for 1 year (for example: 1 pack a day for 30 years or 2 packs a day for 15 years). Yearly screening should continue until the smoker has stopped smoking for at least 15 years. Yearly screening should be stopped for people who develop a health problem that would prevent them from having lung cancer treatment.  If you are pregnant, do not drink alcohol. If you are  breastfeeding, be very cautious about drinking alcohol. If you are not pregnant and choose to drink alcohol, do not have more than 1 drink per day. One drink is considered to be 12 ounces (355 mL) of beer, 5 ounces (148 mL) of wine, or 1.5 ounces (44 mL) of liquor.  Avoid use of street drugs. Do not share needles with anyone. Ask for help if you need support or instructions about stopping the use of drugs.  High blood pressure causes heart disease and increases the risk  of stroke. Your blood pressure should be checked at least every 1 to 2 years. Ongoing high blood pressure should be treated with medicines if weight loss and exercise do not work.  If you are 55-79 years old, ask your health care provider if you should take aspirin to prevent strokes.  Diabetes screening is done by taking a blood sample to check your blood glucose level after you have not eaten for a certain period of time (fasting). If you are not overweight and you do not have risk factors for diabetes, you should be screened once every 3 years starting at age 45. If you are overweight or obese and you are 40-70 years of age, you should be screened for diabetes every year as part of your cardiovascular risk assessment.  Breast cancer screening is essential preventive care for women. You should practice "breast self-awareness." This means understanding the normal appearance and feel of your breasts and may include breast self-examination. Any changes detected, no matter how small, should be reported to a health care provider. Women in their 20s and 30s should have a clinical breast exam (CBE) by a health care provider as part of a regular health exam every 1 to 3 years. After age 40, women should have a CBE every year. Starting at age 40, women should consider having a mammogram (breast X-ray test) every year. Women who have a family history of breast cancer should talk to their health care provider about genetic screening. Women at a high risk of breast cancer should talk to their health care providers about having an MRI and a mammogram every year.  Breast cancer gene (BRCA)-related cancer risk assessment is recommended for women who have family members with BRCA-related cancers. BRCA-related cancers include breast, ovarian, tubal, and peritoneal cancers. Having family members with these cancers may be associated with an increased risk for harmful changes (mutations) in the breast cancer genes BRCA1 and  BRCA2. Results of the assessment will determine the need for genetic counseling and BRCA1 and BRCA2 testing.  Your health care provider may recommend that you be screened regularly for cancer of the pelvic organs (ovaries, uterus, and vagina). This screening involves a pelvic examination, including checking for microscopic changes to the surface of your cervix (Pap test). You may be encouraged to have this screening done every 3 years, beginning at age 21.  For women ages 30-65, health care providers may recommend pelvic exams and Pap testing every 3 years, or they may recommend the Pap and pelvic exam, combined with testing for human papilloma virus (HPV), every 5 years. Some types of HPV increase your risk of cervical cancer. Testing for HPV may also be done on women of any age with unclear Pap test results.  Other health care providers may not recommend any screening for nonpregnant women who are considered low risk for pelvic cancer and who do not have symptoms. Ask your health care provider if a screening pelvic exam is right for   you.  If you have had past treatment for cervical cancer or a condition that could lead to cancer, you need Pap tests and screening for cancer for at least 20 years after your treatment. If Pap tests have been discontinued, your risk factors (such as having a new sexual partner) need to be reassessed to determine if screening should resume. Some women have medical problems that increase the chance of getting cervical cancer. In these cases, your health care provider may recommend more frequent screening and Pap tests.  Colorectal cancer can be detected and often prevented. Most routine colorectal cancer screening begins at the age of 50 years and continues through age 75 years. However, your health care provider may recommend screening at an earlier age if you have risk factors for colon cancer. On a yearly basis, your health care provider may provide home test kits to check  for hidden blood in the stool. Use of a small camera at the end of a tube, to directly examine the colon (sigmoidoscopy or colonoscopy), can detect the earliest forms of colorectal cancer. Talk to your health care provider about this at age 50, when routine screening begins. Direct exam of the colon should be repeated every 5-10 years through age 75 years, unless early forms of precancerous polyps or small growths are found.  People who are at an increased risk for hepatitis B should be screened for this virus. You are considered at high risk for hepatitis B if:  You were born in a country where hepatitis B occurs often. Talk with your health care provider about which countries are considered high risk.  Your parents were born in a high-risk country and you have not received a shot to protect against hepatitis B (hepatitis B vaccine).  You have HIV or AIDS.  You use needles to inject street drugs.  You live with, or have sex with, someone who has hepatitis B.  You get hemodialysis treatment.  You take certain medicines for conditions like cancer, organ transplantation, and autoimmune conditions.  Hepatitis C blood testing is recommended for all people born from 1945 through 1965 and any individual with known risks for hepatitis C.  Practice safe sex. Use condoms and avoid high-risk sexual practices to reduce the spread of sexually transmitted infections (STIs). STIs include gonorrhea, chlamydia, syphilis, trichomonas, herpes, HPV, and human immunodeficiency virus (HIV). Herpes, HIV, and HPV are viral illnesses that have no cure. They can result in disability, cancer, and death.  You should be screened for sexually transmitted illnesses (STIs) including gonorrhea and chlamydia if:  You are sexually active and are younger than 24 years.  You are older than 24 years and your health care provider tells you that you are at risk for this type of infection.  Your sexual activity has changed  since you were last screened and you are at an increased risk for chlamydia or gonorrhea. Ask your health care provider if you are at risk.  If you are at risk of being infected with HIV, it is recommended that you take a prescription medicine daily to prevent HIV infection. This is called preexposure prophylaxis (PrEP). You are considered at risk if:  You are sexually active and do not regularly use condoms or know the HIV status of your partner(s).  You take drugs by injection.  You are sexually active with a partner who has HIV.  Talk with your health care provider about whether you are at high risk of being infected with HIV. If   you choose to begin PrEP, you should first be tested for HIV. You should then be tested every 3 months for as long as you are taking PrEP.  Osteoporosis is a disease in which the bones lose minerals and strength with aging. This can result in serious bone fractures or breaks. The risk of osteoporosis can be identified using a bone density scan. Women ages 67 years and over and women at risk for fractures or osteoporosis should discuss screening with their health care providers. Ask your health care provider whether you should take a calcium supplement or vitamin D to reduce the rate of osteoporosis.  Menopause can be associated with physical symptoms and risks. Hormone replacement therapy is available to decrease symptoms and risks. You should talk to your health care provider about whether hormone replacement therapy is right for you.  Use sunscreen. Apply sunscreen liberally and repeatedly throughout the day. You should seek shade when your shadow is shorter than you. Protect yourself by wearing long sleeves, pants, a wide-brimmed hat, and sunglasses year round, whenever you are outdoors.  Once a month, do a whole body skin exam, using a mirror to look at the skin on your back. Tell your health care provider of new moles, moles that have irregular borders, moles that  are larger than a pencil eraser, or moles that have changed in shape or color.  Stay current with required vaccines (immunizations).  Influenza vaccine. All adults should be immunized every year.  Tetanus, diphtheria, and acellular pertussis (Td, Tdap) vaccine. Pregnant women should receive 1 dose of Tdap vaccine during each pregnancy. The dose should be obtained regardless of the length of time since the last dose. Immunization is preferred during the 27th-36th week of gestation. An adult who has not previously received Tdap or who does not know her vaccine status should receive 1 dose of Tdap. This initial dose should be followed by tetanus and diphtheria toxoids (Td) booster doses every 10 years. Adults with an unknown or incomplete history of completing a 3-dose immunization series with Td-containing vaccines should begin or complete a primary immunization series including a Tdap dose. Adults should receive a Td booster every 10 years.  Varicella vaccine. An adult without evidence of immunity to varicella should receive 2 doses or a second dose if she has previously received 1 dose. Pregnant females who do not have evidence of immunity should receive the first dose after pregnancy. This first dose should be obtained before leaving the health care facility. The second dose should be obtained 4-8 weeks after the first dose.  Human papillomavirus (HPV) vaccine. Females aged 13-26 years who have not received the vaccine previously should obtain the 3-dose series. The vaccine is not recommended for use in pregnant females. However, pregnancy testing is not needed before receiving a dose. If a female is found to be pregnant after receiving a dose, no treatment is needed. In that case, the remaining doses should be delayed until after the pregnancy. Immunization is recommended for any person with an immunocompromised condition through the age of 61 years if she did not get any or all doses earlier. During the  3-dose series, the second dose should be obtained 4-8 weeks after the first dose. The third dose should be obtained 24 weeks after the first dose and 16 weeks after the second dose.  Zoster vaccine. One dose is recommended for adults aged 30 years or older unless certain conditions are present.  Measles, mumps, and rubella (MMR) vaccine. Adults born  before 1957 generally are considered immune to measles and mumps. Adults born in 1957 or later should have 1 or more doses of MMR vaccine unless there is a contraindication to the vaccine or there is laboratory evidence of immunity to each of the three diseases. A routine second dose of MMR vaccine should be obtained at least 28 days after the first dose for students attending postsecondary schools, health care workers, or international travelers. People who received inactivated measles vaccine or an unknown type of measles vaccine during 1963-1967 should receive 2 doses of MMR vaccine. People who received inactivated mumps vaccine or an unknown type of mumps vaccine before 1979 and are at high risk for mumps infection should consider immunization with 2 doses of MMR vaccine. For females of childbearing age, rubella immunity should be determined. If there is no evidence of immunity, females who are not pregnant should be vaccinated. If there is no evidence of immunity, females who are pregnant should delay immunization until after pregnancy. Unvaccinated health care workers born before 1957 who lack laboratory evidence of measles, mumps, or rubella immunity or laboratory confirmation of disease should consider measles and mumps immunization with 2 doses of MMR vaccine or rubella immunization with 1 dose of MMR vaccine.  Pneumococcal 13-valent conjugate (PCV13) vaccine. When indicated, a person who is uncertain of his immunization history and has no record of immunization should receive the PCV13 vaccine. All adults 65 years of age and older should receive this  vaccine. An adult aged 19 years or older who has certain medical conditions and has not been previously immunized should receive 1 dose of PCV13 vaccine. This PCV13 should be followed with a dose of pneumococcal polysaccharide (PPSV23) vaccine. Adults who are at high risk for pneumococcal disease should obtain the PPSV23 vaccine at least 8 weeks after the dose of PCV13 vaccine. Adults older than 52 years of age who have normal immune system function should obtain the PPSV23 vaccine dose at least 1 year after the dose of PCV13 vaccine.  Pneumococcal polysaccharide (PPSV23) vaccine. When PCV13 is also indicated, PCV13 should be obtained first. All adults aged 65 years and older should be immunized. An adult younger than age 65 years who has certain medical conditions should be immunized. Any person who resides in a nursing home or long-term care facility should be immunized. An adult smoker should be immunized. People with an immunocompromised condition and certain other conditions should receive both PCV13 and PPSV23 vaccines. People with human immunodeficiency virus (HIV) infection should be immunized as soon as possible after diagnosis. Immunization during chemotherapy or radiation therapy should be avoided. Routine use of PPSV23 vaccine is not recommended for American Indians, [place] Natives, or people younger than 65 years unless there are medical conditions that require PPSV23 vaccine. When indicated, people who have unknown immunization and have no record of immunization should receive PPSV23 vaccine. One-time revaccination 5 years after the first dose of PPSV23 is recommended for people aged 19-64 years who have chronic kidney failure, nephrotic syndrome, asplenia, or immunocompromised conditions. People who received 1-2 doses of PPSV23 before age 65 years should receive another dose of PPSV23 vaccine at age 65 years or later if at least 5 years have passed since the previous dose. Doses of PPSV23 are not  needed for people immunized with PPSV23 at or after age 65 years.  Meningococcal vaccine. Adults with asplenia or persistent complement component deficiencies should receive 2 doses of quadrivalent meningococcal conjugate (MenACWY-D) vaccine. The doses should be obtained   at least 2 months apart. Microbiologists working with certain meningococcal bacteria, [place] recruits, people at risk during an outbreak, and people who travel to or live in countries with a high rate of meningitis should be immunized. A first-year college student up through age 34 years who is living in a residence hall should receive a dose if she did not receive a dose on or after her 16th birthday. Adults who have certain high-risk conditions should receive one or more doses of vaccine.  Hepatitis A vaccine. Adults who wish to be protected from this disease, have certain high-risk conditions, work with hepatitis A-infected animals, work in hepatitis A research labs, or travel to or work in countries with a high rate of hepatitis A should be immunized. Adults who were previously unvaccinated and who anticipate close contact with an international adoptee during the first 60 days after arrival in the [place] from a country with a high rate of hepatitis A should be immunized.  Hepatitis B vaccine. Adults who wish to be protected from this disease, have certain high-risk conditions, may be exposed to blood or other infectious body fluids, are household contacts or sex partners of hepatitis B positive people, are clients or workers in certain care facilities, or travel to or work in countries with a high rate of hepatitis B should be immunized.  Haemophilus influenzae type b (Hib) vaccine. A previously unvaccinated person with asplenia or sickle cell disease or having a scheduled splenectomy should receive 1 dose of Hib vaccine. Regardless of previous immunization, a recipient of a hematopoietic stem cell transplant should receive a  3-dose series 6-12 months after her successful transplant. Hib vaccine is not recommended for adults with HIV infection. Preventive Services / Frequency Ages 35 to 4 years  Blood pressure check.** / Every 3-5 years.  Lipid and cholesterol check.** / Every 5 years beginning at age 60.  Clinical breast exam.** / Every 3 years for women in their 71s and 10s.  BRCA-related cancer risk assessment.** / For women who have family members with a BRCA-related cancer (breast, ovarian, tubal, or peritoneal cancers).  Pap test.** / Every 2 years from ages 76 through 26. Every 3 years starting at age 61 through age 76 or 93 with a history of 3 consecutive normal Pap tests.  HPV screening.** / Every 3 years from ages 37 through ages 60 to 51 with a history of 3 consecutive normal Pap tests.  Hepatitis C blood test.** / For any individual with known risks for hepatitis C.  Skin self-exam. / Monthly.  Influenza vaccine. / Every year.  Tetanus, diphtheria, and acellular pertussis (Tdap, Td) vaccine.** / Consult your health care provider. Pregnant women should receive 1 dose of Tdap vaccine during each pregnancy. 1 dose of Td every 10 years.  Varicella vaccine.** / Consult your health care provider. Pregnant females who do not have evidence of immunity should receive the first dose after pregnancy.  HPV vaccine. / 3 doses over 6 months, if 93 and younger. The vaccine is not recommended for use in pregnant females. However, pregnancy testing is not needed before receiving a dose.  Measles, mumps, rubella (MMR) vaccine.** / You need at least 1 dose of MMR if you were born in 1957 or later. You may also need a 2nd dose. For females of childbearing age, rubella immunity should be determined. If there is no evidence of immunity, females who are not pregnant should be vaccinated. If there is no evidence of immunity, females who are  pregnant should delay immunization until after pregnancy.  Pneumococcal  13-valent conjugate (PCV13) vaccine.** / Consult your health care provider.  Pneumococcal polysaccharide (PPSV23) vaccine.** / 1 to 2 doses if you smoke cigarettes or if you have certain conditions.  Meningococcal vaccine.** / 1 dose if you are age 68 to 8 years and a Market researcher living in a residence hall, or have one of several medical conditions, you need to get vaccinated against meningococcal disease. You may also need additional booster doses.  Hepatitis A vaccine.** / Consult your health care provider.  Hepatitis B vaccine.** / Consult your health care provider.  Haemophilus influenzae type b (Hib) vaccine.** / Consult your health care provider. Ages 7 to 53 years  Blood pressure check.** / Every year.  Lipid and cholesterol check.** / Every 5 years beginning at age 25 years.  Lung cancer screening. / Every year if you are aged 11-80 years and have a 30-pack-year history of smoking and currently smoke or have quit within the past 15 years. Yearly screening is stopped once you have quit smoking for at least 15 years or develop a health problem that would prevent you from having lung cancer treatment.  Clinical breast exam.** / Every year after age 48 years.  BRCA-related cancer risk assessment.** / For women who have family members with a BRCA-related cancer (breast, ovarian, tubal, or peritoneal cancers).  Mammogram.** / Every year beginning at age 41 years and continuing for as long as you are in good health. Consult with your health care provider.  Pap test.** / Every 3 years starting at age 65 years through age 37 or 70 years with a history of 3 consecutive normal Pap tests.  HPV screening.** / Every 3 years from ages 72 years through ages 60 to 40 years with a history of 3 consecutive normal Pap tests.  Fecal occult blood test (FOBT) of stool. / Every year beginning at age 21 years and continuing until age 5 years. You may not need to do this test if you get  a colonoscopy every 10 years.  Flexible sigmoidoscopy or colonoscopy.** / Every 5 years for a flexible sigmoidoscopy or every 10 years for a colonoscopy beginning at age 35 years and continuing until age 48 years.  Hepatitis C blood test.** / For all people born from [birth year] through 1965 and any individual with known risks for hepatitis C.  Skin self-exam. / Monthly.  Influenza vaccine. / Every year.  Tetanus, diphtheria, and acellular pertussis (Tdap/Td) vaccine.** / Consult your health care provider. Pregnant women should receive 1 dose of Tdap vaccine during each pregnancy. 1 dose of Td every 10 years.  Varicella vaccine.** / Consult your health care provider. Pregnant females who do not have evidence of immunity should receive the first dose after pregnancy.  Zoster vaccine.** / 1 dose for adults aged 30 years or older.  Measles, mumps, rubella (MMR) vaccine.** / You need at least 1 dose of MMR if you were born in 1957 or later. You may also need a second dose. For females of childbearing age, rubella immunity should be determined. If there is no evidence of immunity, females who are not pregnant should be vaccinated. If there is no evidence of immunity, females who are pregnant should delay immunization until after pregnancy.  Pneumococcal 13-valent conjugate (PCV13) vaccine.** / Consult your health care provider.  Pneumococcal polysaccharide (PPSV23) vaccine.** / 1 to 2 doses if you smoke cigarettes or if you have certain conditions.  Meningococcal vaccine.** /  Consult your health care provider.  Hepatitis A vaccine.** / Consult your health care provider.  Hepatitis B vaccine.** / Consult your health care provider.  Haemophilus influenzae type b (Hib) vaccine.** / Consult your health care provider. Ages 64 years and over  Blood pressure check.** / Every year.  Lipid and cholesterol check.** / Every 5 years beginning at age 23 years.  Lung cancer screening. / Every year if you  are aged 16-80 years and have a 30-pack-year history of smoking and currently smoke or have quit within the past 15 years. Yearly screening is stopped once you have quit smoking for at least 15 years or develop a health problem that would prevent you from having lung cancer treatment.  Clinical breast exam.** / Every year after age 74 years.  BRCA-related cancer risk assessment.** / For women who have family members with a BRCA-related cancer (breast, ovarian, tubal, or peritoneal cancers).  Mammogram.** / Every year beginning at age 44 years and continuing for as long as you are in good health. Consult with your health care provider.  Pap test.** / Every 3 years starting at age 58 years through age 22 or 39 years with 3 consecutive normal Pap tests. Testing can be stopped between 65 and 70 years with 3 consecutive normal Pap tests and no abnormal Pap or HPV tests in the past 10 years.  HPV screening.** / Every 3 years from ages 64 years through ages 70 or 61 years with a history of 3 consecutive normal Pap tests. Testing can be stopped between 65 and 70 years with 3 consecutive normal Pap tests and no abnormal Pap or HPV tests in the past 10 years.  Fecal occult blood test (FOBT) of stool. / Every year beginning at age 40 years and continuing until age 27 years. You may not need to do this test if you get a colonoscopy every 10 years.  Flexible sigmoidoscopy or colonoscopy.** / Every 5 years for a flexible sigmoidoscopy or every 10 years for a colonoscopy beginning at age 7 years and continuing until age 32 years.  Hepatitis C blood test.** / For all people born from [birth year] through 1965 and any individual with known risks for hepatitis C.  Osteoporosis screening.** / A one-time screening for women ages 30 years and over and women at risk for fractures or osteoporosis.  Skin self-exam. / Monthly.  Influenza vaccine. / Every year.  Tetanus, diphtheria, and acellular pertussis (Tdap/Td)  vaccine.** / 1 dose of Td every 10 years.  Varicella vaccine.** / Consult your health care provider.  Zoster vaccine.** / 1 dose for adults aged 35 years or older.  Pneumococcal 13-valent conjugate (PCV13) vaccine.** / Consult your health care provider.  Pneumococcal polysaccharide (PPSV23) vaccine.** / 1 dose for all adults aged 46 years and older.  Meningococcal vaccine.** / Consult your health care provider.  Hepatitis A vaccine.** / Consult your health care provider.  Hepatitis B vaccine.** / Consult your health care provider.  Haemophilus influenzae type b (Hib) vaccine.** / Consult your health care provider. ** Family history and personal history of risk and conditions may change your health care provider's recommendations.   This information is not intended to replace advice given to you by your health care provider. Make sure you discuss any questions you have with your health care provider.   Document Released: 08/14/2001 Document Revised: 07/09/2014 Document Reviewed: 11/13/2010 Elsevier Interactive Patient Education [employer].

## 2015-10-25 LAB — CYTOLOGY - PAP

## 2015-10-25 MED ORDER — ATORVASTATIN CALCIUM 10 MG PO TABS: 10.0000 mg | ORAL_TABLET | Freq: Every day | ORAL | Status: DC

## 2016-04-02 ENCOUNTER — Other Ambulatory Visit: Payer: Self-pay | Admitting: Family Medicine

## 2016-04-02 DIAGNOSIS — Z1231 Encounter for screening mammogram for malignant neoplasm of breast: Secondary | ICD-10-CM

## 2016-04-24 ENCOUNTER — Encounter: Payer: Self-pay | Admitting: Family Medicine

## 2016-04-24 ENCOUNTER — Ambulatory Visit (INDEPENDENT_AMBULATORY_CARE_PROVIDER_SITE_OTHER): Payer: Managed Care, Other (non HMO) | Admitting: Family Medicine

## 2016-04-24 VITALS — BP 122/72 | HR 65 | Temp 98.3°F | Wt 146.8 lb

## 2016-04-24 DIAGNOSIS — K760 Fatty (change of) liver, not elsewhere classified: Secondary | ICD-10-CM

## 2016-04-24 DIAGNOSIS — Z23 Encounter for immunization: Secondary | ICD-10-CM

## 2016-04-24 DIAGNOSIS — Z299 Encounter for prophylactic measures, unspecified: Secondary | ICD-10-CM

## 2016-04-24 DIAGNOSIS — E040 Nontoxic diffuse goiter: Secondary | ICD-10-CM

## 2016-04-24 DIAGNOSIS — R748 Abnormal levels of other serum enzymes: Secondary | ICD-10-CM

## 2016-04-24 HISTORY — DX: Nontoxic diffuse goiter: E04.0

## 2016-04-24 HISTORY — DX: Abnormal levels of other serum enzymes: R74.8

## 2016-04-24 NOTE — Assessment & Plan Note (Signed)
Tolerating statin, encouraged heart healthy diet, avoid trans fats, minimize simple carbs and saturated fats. Increase exercise as tolerated 

## 2016-04-24 NOTE — Assessment & Plan Note (Signed)
Mild on [place] in 2015 no need for repeat imaging unless new symptoms occur

## 2016-04-24 NOTE — Progress Notes (Signed)
Pre visit review using our clinic review tool, if applicable. No additional management support is needed unless otherwise documented below in the visit note. 

## 2016-04-24 NOTE — Assessment & Plan Note (Signed)
Will continue with annual paps, repeat at CPE in 6 months

## 2016-04-24 NOTE — Progress Notes (Signed)
Patient ID: Shawna Johnson, female   DOB: 01-30-1966, 50 y.o.   MRN: [id number]   Subjective:    Patient ID: Shawna Johnson, female    DOB: 06-Dec-1965, 50 y.o.   MRN: [id number]  Chief Complaint  Patient presents with  . Follow-up    HPI Patient is in today for follow up. No recent illness or acute concerns. She is noting a slight sense of SOB with significant exertion. No recent worsening, wheezing or coughing. She quit smoking 3 years ago and smoked 1/2 ppd x 30 years before that. Denies CP/palp/HA/congestion/fevers/GI or GU c/o. Taking meds as prescribed  Past Medical History:  Diagnosis Date  . Abnormal alkaline phosphatase test 04/24/2016  . Arthritis   . Elevated alkaline phosphatase level   . Fatty liver   . 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 Hyperlipidemia   . Preventative health care 10/24/2015    Past Surgical History:  Procedure Laterality Date  . CERVICAL CONIZATION W/BX  05/2006  . INNER EAR SURGERY     several childhood  . TONSILLECTOMY AND ADENOIDECTOMY      Family History  Problem Relation Age of Onset  . Heart attack Father   . Hyperlipidemia Father   . Hypertension Father   . Sudden death Father   . Heart disease Father     MI  . Alcohol abuse Father   . Breast cancer Maternal Aunt   . Congestive Heart Failure Sister   . COPD Sister   . Heart disease Sister     has pacer/defib in place   . Cardiomyopathy Sister   . Drug abuse Sister     crack in past  . Colon cancer Neg Hx   . Esophageal cancer Neg Hx   . Stomach cancer Neg Hx   . Rectal cancer Neg Hx   . Mental illness Maternal Grandfather     suicide  . Heart disease Paternal Grandmother   . Heart disease Paternal Grandfather     Social History   Social History  . Marital status: Married    Spouse name: N/A  . Number of children: 1  . Years of education: N/A   Occupational History  . Stylist    Social History Main Topics  . Smoking status: Former Smoker    Types: E-cigarettes  .  Smokeless tobacco: Never Used  . Alcohol use 0.0 oz/week     Comment: Rare  . Drug use: No  . 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 Not on file   Social History Narrative  . No narrative on file    Outpatient Medications Prior to Visit  Medication Sig Dispense Refill  . atorvastatin (LIPITOR) 10 MG tablet Take 1 tablet (10 mg total) by mouth daily. 90 tablet 1  . Multiple Vitamins-Minerals (MULTIVITAMIN PO) Take by mouth daily.     No facility-administered medications prior to visit.     Allergies  Allergen Reactions  . Sulfa Antibiotics Swelling    Review of Systems  Constitutional: Negative for fever and malaise/fatigue.  HENT: Negative for congestion.   Eyes: Negative for blurred vision.  Respiratory: Negative for shortness of breath.   Cardiovascular: Negative for chest pain, palpitations and leg swelling.  Gastrointestinal: Negative for abdominal pain, blood in stool and nausea.  Genitourinary: Negative for dysuria and frequency.  Musculoskeletal: Negative for falls.  Skin: Negative for rash.  Neurological: Negative for dizziness, loss of consciousness and headaches.  Endo/Heme/Allergies: Negative for environmental allergies.  Psychiatric/Behavioral: Negative for depression. The patient is not nervous/anxious.        Objective:    Physical Exam  Constitutional: She is oriented to person, place, and time. She appears well-developed and well-nourished. No distress.  HENT:  Head: Normocephalic and atraumatic.  Nose: Nose normal.  Eyes: Right eye exhibits no discharge. Left eye exhibits no discharge.  Neck: Normal range of motion. Neck supple.  Cardiovascular: Normal rate and regular rhythm.   No murmur heard. Pulmonary/Chest: Effort normal and breath sounds normal.  Abdominal: Soft. Bowel sounds are normal. There is no  tenderness.  Musculoskeletal: She exhibits no edema.  Neurological: She is alert and oriented to person, place, and time.  Skin: Skin is warm and dry.  Psychiatric: She has a normal mood and affect.  Nursing note and vitals reviewed.   BP 122/72 (BP Location: Left Arm, Patient Position: Sitting, Cuff Size: Normal)   Pulse 65   Temp 98.3 F (36.8 C) (Oral)   Wt 146 lb 12.8 oz (66.6 kg)   SpO2 98%   BMI 26.00 kg/m  Wt Readings from Last 3 Encounters:  04/24/16 146 lb 12.8 oz (66.6 kg)  10/24/15 146 lb 4 oz (66.3 kg)  04/25/15 144 lb (65.3 kg)     Lab Results  Component Value Date   WBC 8.4 10/24/2015   HGB 13.5 10/24/2015   HCT 41.1 10/24/2015   PLT 385.0 10/24/2015   GLUCOSE 99 10/24/2015   CHOL 192 10/24/2015   TRIG 152.0 (H) 10/24/2015   HDL 57.30 10/24/2015   LDLCALC 104 (H) 10/24/2015   ALT 15 10/24/2015   AST 16 10/24/2015   NA 141 10/24/2015   K 3.9 10/24/2015   CL 103 10/24/2015   CREATININE 0.77 10/24/2015   BUN 12 10/24/2015   CO2 30 10/24/2015   TSH 0.68 10/24/2015    Lab Results  Component Value Date   TSH 0.68 10/24/2015   Lab Results  Component Value Date   WBC 8.4 10/24/2015   HGB 13.5 10/24/2015   HCT 41.1 10/24/2015   MCV 88.3 10/24/2015   PLT 385.0 10/24/2015   Lab Results  Component Value Date   NA 141 10/24/2015   K 3.9 10/24/2015   CO2 30 10/24/2015   GLUCOSE 99 10/24/2015   BUN 12 10/24/2015   CREATININE 0.77 10/24/2015   BILITOT 0.4 10/24/2015   ALKPHOS 133 (H) 10/24/2015   AST 16 10/24/2015   ALT 15 10/24/2015   PROT 7.0 10/24/2015   ALBUMIN 4.3 10/24/2015   CALCIUM 9.7 10/24/2015   GFR 83.74 10/24/2015   Lab Results  Component Value Date   CHOL 192 10/24/2015   Lab Results  Component Value Date   HDL 57.30 10/24/2015   Lab Results  Component Value Date   LDLCALC 104 (H) 10/24/2015   Lab Results  Component Value Date   TRIG 152.0 (H) 10/24/2015   Lab Results  Component Value Date   CHOLHDL 3 10/24/2015     No results found for: HGBA1C     Assessment & Plan:   Goiter, simple Mild on [place] in 2015 no need for repeat imaging unless new symptoms occur  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 Increase exercise as tolerated  Abnormal alkaline phosphatase test Mild and stable will continue to monitor annually unless new symptoms arise  Hepatic steatosis Minimize simple carbs and fatty foods. Maintain adequate exercise   I am having Ms. Escareno maintain her Multiple Vitamins-Minerals (MULTIVITAMIN PO) and atorvastatin.  No orders of the defined types were placed in this encounter.    Penni Homans, MD

## 2016-04-24 NOTE — Progress Notes (Signed)
Patient ID: Shawna Johnson, female   DOB: 02-07-1966, 50 y.o.   MRN: [id number]   Subjective:    Patient ID: Shawna Johnson, female    DOB: 01/19/1966, 50 y.o.   MRN: [id number]  Chief Complaint  Patient presents with  . Follow-up    HPI Patient is in today for a follow up with no acute symptoms. Pt did mention she some times lose her breath for no longer than a second.She says there is nothing that she notices everyday it just happens.   Pt states she is asymptomatic.   Past Medical History:  Diagnosis Date  . Abnormal alkaline phosphatase test 04/24/2016  . Arthritis   . Elevated alkaline phosphatase level   . Fatty liver   . 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 Hyperlipidemia   . Preventative health care 10/24/2015    Past Surgical History:  Procedure Laterality Date  . CERVICAL CONIZATION W/BX  05/2006  . INNER EAR SURGERY     several childhood  . TONSILLECTOMY AND ADENOIDECTOMY      Family History  Problem Relation Age of Onset  . Heart attack Father   . Hyperlipidemia Father   . Hypertension Father   . Sudden death Father   . Heart disease Father     MI  . Alcohol abuse Father   . Breast cancer Maternal Aunt   . Congestive Heart Failure Sister   . COPD Sister   . Heart disease Sister     has pacer/defib in place   . Cardiomyopathy Sister   . Drug abuse Sister     crack in past  . Colon cancer Neg Hx   . Esophageal cancer Neg Hx   . Stomach cancer Neg Hx   . Rectal cancer Neg Hx   . Mental illness Maternal Grandfather     suicide  . Heart disease Paternal Grandmother   . Heart disease Paternal Grandfather     Social History   Social History  . Marital status: Married    Spouse name: N/A  . Number of children: 1  . Years of education: N/A   Occupational History  . Stylist    Social History Main Topics  . Smoking status: Former Smoker    Types: E-cigarettes  . Smokeless tobacco: Never Used  . Alcohol use 0.0 oz/week     Comment: Rare  . Drug use:  No  . 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 Not on file   Social History Narrative  . No narrative on file    Outpatient Medications Prior to Visit  Medication Sig Dispense Refill  . atorvastatin (LIPITOR) 10 MG tablet Take 1 tablet (10 mg total) by mouth daily. 90 tablet 1  . Multiple Vitamins-Minerals (MULTIVITAMIN PO) Take by mouth daily.     No facility-administered medications prior to visit.     Allergies  Allergen Reactions  . Sulfa Antibiotics Swelling    Review of Systems  Constitutional: Negative for fever.  Eyes: Negative for blurred vision.  Respiratory: Negative for cough and shortness of breath.   Cardiovascular: Negative for chest pain and palpitations.  Gastrointestinal: Negative for vomiting.  Musculoskeletal: Negative for back pain.  Skin: Negative for rash.  Neurological: Negative for loss of consciousness and headaches.       Objective:    Physical Exam  Constitutional: She  is oriented to person, place, and time. She appears well-developed and well-nourished. No distress.  HENT:  Head: Normocephalic and atraumatic.  Eyes: Conjunctivae are normal.  Neck: Normal range of motion. No thyromegaly present.  Cardiovascular: Normal rate and regular rhythm.   Pulmonary/Chest: Effort normal and breath sounds normal. She has no wheezes.  Abdominal: Soft. Bowel sounds are normal. There is no tenderness.  Musculoskeletal: Normal range of motion. She exhibits no edema or deformity.  Neurological: She is alert and oriented to person, place, and time.  Skin: Skin is warm and dry. She is not diaphoretic.  Psychiatric: She has a normal mood and affect.    BP 122/72 (BP Location: Left Arm, Patient Position: Sitting, Cuff Size: Normal)   Pulse 65   Temp 98.3 F (36.8 C) (Oral)   Wt 146 lb 12.8 oz (66.6 kg)   SpO2 98%   BMI  26.00 kg/m  Wt Readings from Last 3 Encounters:  04/24/16 146 lb 12.8 oz (66.6 kg)  10/24/15 146 lb 4 oz (66.3 kg)  04/25/15 144 lb (65.3 kg)     Lab Results  Component Value Date   WBC 8.4 10/24/2015   HGB 13.5 10/24/2015   HCT 41.1 10/24/2015   PLT 385.0 10/24/2015   GLUCOSE 99 10/24/2015   CHOL 192 10/24/2015   TRIG 152.0 (H) 10/24/2015   HDL 57.30 10/24/2015   LDLCALC 104 (H) 10/24/2015   ALT 15 10/24/2015   AST 16 10/24/2015   NA 141 10/24/2015   K 3.9 10/24/2015   CL 103 10/24/2015   CREATININE 0.77 10/24/2015   BUN 12 10/24/2015   CO2 30 10/24/2015   TSH 0.68 10/24/2015    Lab Results  Component Value Date   TSH 0.68 10/24/2015   Lab Results  Component Value Date   WBC 8.4 10/24/2015   HGB 13.5 10/24/2015   HCT 41.1 10/24/2015   MCV 88.3 10/24/2015   PLT 385.0 10/24/2015   Lab Results  Component Value Date   NA 141 10/24/2015   K 3.9 10/24/2015   CO2 30 10/24/2015   GLUCOSE 99 10/24/2015   BUN 12 10/24/2015   CREATININE 0.77 10/24/2015   BILITOT 0.4 10/24/2015   ALKPHOS 133 (H) 10/24/2015   AST 16 10/24/2015   ALT 15 10/24/2015   PROT 7.0 10/24/2015   ALBUMIN 4.3 10/24/2015   CALCIUM 9.7 10/24/2015   GFR 83.74 10/24/2015   Lab Results  Component Value Date   CHOL 192 10/24/2015   Lab Results  Component Value Date   HDL 57.30 10/24/2015   Lab Results  Component Value Date   LDLCALC 104 (H) 10/24/2015   Lab Results  Component Value Date   TRIG 152.0 (H) 10/24/2015   Lab Results  Component Value Date   CHOLHDL 3 10/24/2015   No results found for: HGBA1C     Assessment & Plan:   Problem List Items Addressed This Visit    Hepatic steatosis    Minimize simple carbs and fatty foods. Maintain adequate exercise       Other Visit Diagnoses    Encounter for preventive measure    -  Primary   Relevant Orders   TSH   Lipid panel   CBC   Comp Met (CMET)      I am having Shawna Johnson maintain her Multiple Vitamins-Minerals  (MULTIVITAMIN PO) and atorvastatin.  No orders of the defined types were placed in this encounter.    Penni Homans, MD

## 2016-04-24 NOTE — Assessment & Plan Note (Signed)
Mild and stable will continue to monitor annually unless new symptoms arise

## 2016-04-24 NOTE — Patient Instructions (Signed)

## 2016-04-29 NOTE — Assessment & Plan Note (Signed)
Minimize simple carbs and fatty foods. Maintain adequate exercise

## 2016-05-01 ENCOUNTER — Ambulatory Visit (HOSPITAL_BASED_OUTPATIENT_CLINIC_OR_DEPARTMENT_OTHER): Payer: Managed Care, Other (non HMO)

## 2016-05-01 ENCOUNTER — Ambulatory Visit (HOSPITAL_BASED_OUTPATIENT_CLINIC_OR_DEPARTMENT_OTHER): Payer: BLUE CROSS/BLUE SHIELD

## 2016-05-01 ENCOUNTER — Encounter: Payer: Self-pay | Admitting: Family Medicine

## 2016-05-07 ENCOUNTER — Ambulatory Visit (HOSPITAL_BASED_OUTPATIENT_CLINIC_OR_DEPARTMENT_OTHER)
Admission: RE | Admit: 2016-05-07 | Discharge: 2016-05-07 | Disposition: A | Discharge status: Home / Self Care | Payer: Managed Care, Other (non HMO) | Source: Ambulatory Visit | Attending: Family Medicine | Admitting: Family Medicine

## 2016-05-07 DIAGNOSIS — Z1231 Encounter for screening mammogram for malignant neoplasm of breast: Secondary | ICD-10-CM | POA: Diagnosis present

## 2016-10-29 ENCOUNTER — Other Ambulatory Visit: Payer: Managed Care, Other (non HMO)

## 2016-10-30 ENCOUNTER — Other Ambulatory Visit (INDEPENDENT_AMBULATORY_CARE_PROVIDER_SITE_OTHER): Payer: 59

## 2016-10-30 DIAGNOSIS — Z299 Encounter for prophylactic measures, unspecified: Secondary | ICD-10-CM | POA: Diagnosis not present

## 2016-10-30 LAB — LIPID PANEL
CHOLESTEROL: 163 mg/dL (ref 0–200)
HDL: 50.8 mg/dL (ref >39.00)
LDL Cholesterol: 79 mg/dL (ref 0–99)
NonHDL: 111.91
Total CHOL/HDL Ratio: 3
Triglycerides: 163 mg/dL — ABNORMAL HIGH (ref 0.0–149.0)
VLDL: 32.6 mg/dL (ref 0.0–40.0)

## 2016-10-30 LAB — CBC
HEMATOCRIT: 41.4 % (ref 36.0–46.0)
Hemoglobin: 13.6 g/dL (ref 12.0–15.0)
MCHC: 32.9 g/dL (ref 30.0–36.0)
MCV: 89.5 fl (ref 78.0–100.0)
Platelets: 338 10*3/uL (ref 150.0–400.0)
RBC: 4.62 Mil/uL (ref 3.87–5.11)
RDW: 13.6 % (ref 11.5–15.5)
WBC: 6.9 10*3/uL (ref 4.0–10.5)

## 2016-10-30 LAB — COMPREHENSIVE METABOLIC PANEL
ALBUMIN: 4.3 g/dL (ref 3.5–5.2)
ALK PHOS: 124 U/L — AB (ref 39–117)
ALT: 14 U/L (ref 0–35)
AST: 14 U/L (ref 0–37)
BUN: 13 mg/dL (ref 6–23)
CHLORIDE: 107 meq/L (ref 96–112)
CO2: 27 mEq/L (ref 19–32)
CREATININE: 0.84 mg/dL (ref 0.40–1.20)
Calcium: 9.4 mg/dL (ref 8.4–10.5)
GFR: 75.44 mL/min (ref >60.00)
Glucose, Bld: 99 mg/dL (ref 70–99)
Potassium: 3.5 mEq/L (ref 3.5–5.1)
Sodium: 143 mEq/L (ref 135–145)
TOTAL PROTEIN: 6.8 g/dL (ref 6.0–8.3)
Total Bilirubin: 0.4 mg/dL (ref 0.2–1.2)

## 2016-10-30 LAB — TSH: TSH: 0.82 u[IU]/mL (ref 0.35–4.50)

## 2016-11-01 ENCOUNTER — Ambulatory Visit (INDEPENDENT_AMBULATORY_CARE_PROVIDER_SITE_OTHER): Payer: 59 | Admitting: Family Medicine

## 2016-11-01 ENCOUNTER — Encounter: Payer: Self-pay | Admitting: Family Medicine

## 2016-11-01 VITALS — BP 92/62 | HR 62 | Temp 97.8°F | Resp 18 | Ht 62.0 in | Wt 146.8 lb

## 2016-11-01 DIAGNOSIS — Z0001 Encounter for general adult medical examination with abnormal findings: Secondary | ICD-10-CM

## 2016-11-01 DIAGNOSIS — Z0000 Encounter for general adult medical examination without abnormal findings: Secondary | ICD-10-CM

## 2016-11-01 DIAGNOSIS — E782 Mixed hyperlipidemia: Secondary | ICD-10-CM | POA: Diagnosis not present

## 2016-11-01 DIAGNOSIS — R748 Abnormal levels of other serum enzymes: Secondary | ICD-10-CM | POA: Diagnosis not present

## 2016-11-01 MED ORDER — ATORVASTATIN CALCIUM 10 MG PO TABS: 10.0000 mg | ORAL_TABLET | Freq: Every day | ORAL | 1 refills | Status: DC

## 2016-11-01 MED ORDER — ATORVASTATIN CALCIUM 10 MG PO TABS: 5.0000 mg | ORAL_TABLET | ORAL | 1 refills | Status: DC

## 2016-11-01 NOTE — Patient Instructions (Signed)
HYDRATE HYDRATE HYDRATE 64 OUNCES OF WATER A DAY     Preventive Care 40-64 Years, Female Preventive care refers to lifestyle choices and visits with your health care provider that can promote health and wellness. What does preventive care include?  A yearly physical exam. This is also called an annual well check.  Dental exams once or twice a year.  Routine eye exams. Ask your health care provider how often you should have your eyes checked.  Personal lifestyle choices, including:  Daily care of your teeth and gums.  Regular physical activity.  Eating a healthy diet.  Avoiding tobacco and drug use.  Limiting alcohol use.  Practicing safe sex.  Taking low-dose aspirin daily starting at age 50.  Taking vitamin and mineral supplements as recommended by your health care provider. What happens during an annual well check? The services and screenings done by your health care provider during your annual well check will depend on your age, overall health, lifestyle risk factors, and family history of disease. Counseling  Your health care provider may ask you questions about your:  Alcohol use.  Tobacco use.  Drug use.  Emotional well-being.  Home and relationship well-being.  Sexual activity.  Eating habits.  Work and work environment.  Method of birth control.  Menstrual cycle.  Pregnancy history. Screening  You may have the following tests or measurements:  Height, weight, and BMI.  Blood pressure.  Lipid and cholesterol levels. These may be checked every 5 years, or more frequently if you are over 50 years old.  Skin check.  Lung cancer screening. You may have this screening every year starting at age 55 if you have a 30-pack-year history of smoking and currently smoke or have quit within the past 15 years.  Fecal occult blood test (FOBT) of the stool. You may have this test every year starting at age 50.  Flexible sigmoidoscopy or colonoscopy. You  may have a sigmoidoscopy every 5 years or a colonoscopy every 10 years starting at age 50.  Hepatitis C blood test.  Hepatitis B blood test.  Sexually transmitted disease (STD) testing.  Diabetes screening. This is done by checking your blood sugar (glucose) after you have not eaten for a while (fasting). You may have this done every 1-3 years.  Mammogram. This may be done every 1-2 years. Talk to your health care provider about when you should start having regular mammograms. This may depend on whether you have a family history of breast cancer.  BRCA-related cancer screening. This may be done if you have a family history of breast, ovarian, tubal, or peritoneal cancers.  Pelvic exam and Pap test. This may be done every 3 years starting at age 21. Starting at age 30, this may be done every 5 years if you have a Pap test in combination with an HPV test.  Bone density scan. This is done to screen for osteoporosis. You may have this scan if you are at high risk for osteoporosis. Discuss your test results, treatment options, and if necessary, the need for more tests with your health care provider. Vaccines  Your health care provider may recommend certain vaccines, such as:  Influenza vaccine. This is recommended every year.  Tetanus, diphtheria, and acellular pertussis (Tdap, Td) vaccine. You may need a Td booster every 10 years.  Varicella vaccine. You may need this if you have not been vaccinated.  Zoster vaccine. You may need this after age 60.  Measles, mumps, and rubella (MMR) vaccine.   You may need at least one dose of MMR if you were born in 1957 or later. You may also need a second dose.  Pneumococcal 13-valent conjugate (PCV13) vaccine. You may need this if you have certain conditions and were not previously vaccinated.  Pneumococcal polysaccharide (PPSV23) vaccine. You may need one or two doses if you smoke cigarettes or if you have certain conditions.  Meningococcal vaccine.  You may need this if you have certain conditions.  Hepatitis A vaccine. You may need this if you have certain conditions or if you travel or work in places where you may be exposed to hepatitis A.  Hepatitis B vaccine. You may need this if you have certain conditions or if you travel or work in places where you may be exposed to hepatitis B.  Haemophilus influenzae type b (Hib) vaccine. You may need this if you have certain conditions. Talk to your health care provider about which screenings and vaccines you need and how often you need them. This information is not intended to replace advice given to you by your health care provider. Make sure you discuss any questions you have with your health care provider. Document Released: 07/15/2015 Document Revised: 03/07/2016 Document Reviewed: 04/19/2015 Elsevier Interactive Patient Education  2017 [employer]  

## 2016-11-01 NOTE — Progress Notes (Signed)
Pre visit review using our clinic review tool, if applicable. No additional management support is needed unless otherwise documented below in the visit note. 

## 2016-11-01 NOTE — Progress Notes (Signed)
Subjective:  I acted as a Education administrator for Dr. Charlett Blake. [place], [place]   Patient ID: Shawna Johnson, female    DOB: 1965-11-10, 51 y.o.   MRN: [id number]  No chief complaint on file.   HPI  Patient is in today for an annual exam. Patient had lab work done recently, which was normal. She c/o a small raised bump center of her chest. It is unchanged and she has had it a couple of years. No recent febrile illness or hospitalization. Denies CP/palp/SOB/HA/congestion/fevers/GI or GU c/o. Taking meds as prescribed  Patient Care Team: Mosie Lukes, MD as PCP - General (Family Medicine)   Past Medical History:  Diagnosis Date  . Abnormal alkaline phosphatase test 04/24/2016  . Arthritis   . Elevated alkaline phosphatase level   . Fatty liver   . 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 Hyperlipidemia   . Preventative health care 10/24/2015    Past Surgical History:  Procedure Laterality Date  . CERVICAL CONIZATION W/BX  05/2006  . INNER EAR SURGERY     several childhood  . TONSILLECTOMY AND ADENOIDECTOMY      Family History  Problem Relation Age of Onset  . Heart attack Father   . Hyperlipidemia Father   . Hypertension Father   . Sudden death Father   . Heart disease Father     MI  . Alcohol abuse Father   . Congestive Heart Failure Sister   . COPD Sister   . Heart disease Sister     has pacer/defib in place   . Cardiomyopathy Sister   . Drug abuse Sister     crack in past  . Mental illness Maternal Grandfather     suicide  . Heart disease Paternal Grandmother   . Heart disease Paternal Grandfather   . Rectal cancer Daughter   . Breast cancer Maternal Aunt   . Melanoma Maternal Aunt   . Colon cancer Neg Hx   . Esophageal cancer Neg Hx   . Stomach cancer Neg Hx     Social History   Social History  . Marital status: Married    Spouse name: N/A  . Number of children: 1  . Years of education: N/A   Occupational History  . Stylist    Social History Main Topics  . Smoking  status: Former Smoker    Types: E-cigarettes  . Smokeless tobacco: Former Systems developer    Quit date: 04/24/2016  . Alcohol use 0.0 oz/week     Comment: Rare  . Drug use: No  . 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 Not on file   Social History Narrative  . No narrative on file    Outpatient Medications Prior to Visit  Medication Sig Dispense Refill  . Multiple Vitamins-Minerals (MULTIVITAMIN PO) Take by mouth daily.    Marland Kitchen atorvastatin (LIPITOR) 10 MG tablet Take 1 tablet (10 mg total) by mouth daily. 90 tablet 1   No facility-administered medications prior to visit.     Allergies  Allergen Reactions  . Sulfa Antibiotics Swelling    Review of Systems  Constitutional: Negative for fever and malaise/fatigue.  HENT: Negative for congestion.   Eyes: Negative for blurred vision.  Respiratory: Negative for cough and shortness of breath.   Cardiovascular: Negative for chest pain, palpitations and leg swelling.  Gastrointestinal: Negative for vomiting.  Musculoskeletal: Negative for back pain.  Skin: Negative for rash.  Neurological: Negative for loss of consciousness and headaches.       Objective:    Physical Exam  Constitutional: She is oriented to person, place, and time. She appears well-developed and well-nourished. No distress.  HENT:  Head: Normocephalic and atraumatic.  Eyes: Conjunctivae are normal.  Neck: Normal range of motion. No thyromegaly present.  Cardiovascular: Normal rate and regular rhythm.   Pulmonary/Chest: Effort normal and breath sounds normal. She has no wheezes.  Abdominal: Soft. Bowel sounds are normal. There is no tenderness.  Musculoskeletal: Normal range of motion. She exhibits no edema or deformity.  Neurological: She is alert and oriented to person, place, and time.  Skin: Skin is warm and dry. She is not  diaphoretic.  1 cm raised, cirular shiny lesion mid anterior chest.   Psychiatric: She has a normal mood and affect.    BP 92/62 (BP Location: Left Arm, Patient Position: Sitting, Cuff Size: Normal)   Pulse 62   Temp 97.8 F (36.6 C) (Oral)   Resp 18   Ht 5\' 2"  (1.575 m)   Wt 146 lb 12.8 oz (66.6 kg)   SpO2 98%   BMI 26.85 kg/m  Wt Readings from Last 3 Encounters:  11/01/16 146 lb 12.8 oz (66.6 kg)  04/24/16 146 lb 12.8 oz (66.6 kg)  10/24/15 146 lb 4 oz (66.3 kg)   BP Readings from Last 3 Encounters:  11/01/16 92/62  04/24/16 122/72  10/24/15 108/62     Immunization History  Administered Date(s) Administered  . Influenza,inj,Quad PF,36+ Mos 06/08/2014, 04/25/2015, 04/24/2016  . PPD Test 08/09/2015  . Tdap 10/24/2015    Health Maintenance  Topic Date Due  . HIV Screening  01/12/1979  . INFLUENZA VACCINE  01/30/2017  . MAMMOGRAM  05/07/2018  . PAP SMEAR  10/24/2018  . COLONOSCOPY  07/20/2024  . TETANUS/TDAP  10/23/2025  . Hepatitis C Screening  Completed    Lab Results  Component Value Date   WBC 6.9 10/30/2016   HGB 13.6 10/30/2016   HCT 41.4 10/30/2016   PLT 338.0 10/30/2016   GLUCOSE 99 10/30/2016   CHOL 163 10/30/2016   TRIG 163.0 (H) 10/30/2016   HDL 50.80 10/30/2016   LDLCALC 79 10/30/2016   ALT 14 10/30/2016   AST 14 10/30/2016   NA 143 10/30/2016   K 3.5 10/30/2016   CL 107 10/30/2016   CREATININE 0.84 10/30/2016   BUN 13 10/30/2016   CO2 27 10/30/2016   TSH 0.82 10/30/2016    Lab Results  Component Value Date   TSH 0.82 10/30/2016   Lab Results  Component Value Date   WBC 6.9 10/30/2016   HGB 13.6 10/30/2016   HCT 41.4 10/30/2016   MCV 89.5 10/30/2016   PLT 338.0 10/30/2016   Lab Results  Component Value Date   NA 143 10/30/2016   K 3.5 10/30/2016   CO2 27 10/30/2016   GLUCOSE 99 10/30/2016   BUN 13 10/30/2016   CREATININE 0.84 10/30/2016   BILITOT 0.4 10/30/2016   ALKPHOS 124 (H) 10/30/2016   AST 14 10/30/2016   ALT 14  10/30/2016   PROT 6.8 10/30/2016   ALBUMIN 4.3 10/30/2016   CALCIUM 9.4 10/30/2016   GFR 75.44 10/30/2016   Lab Results  Component Value Date   CHOL 163 10/30/2016   Lab Results  Component Value Date   HDL 50.80 10/30/2016   Lab Results  Component Value Date   LDLCALC  79 10/30/2016   Lab Results  Component Value Date   TRIG 163.0 (H) 10/30/2016   Lab Results  Component Value Date   CHOLHDL 3 10/30/2016   No results found for: HGBA1C       Assessment & Plan:   Problem List Items Addressed This Visit    Hyperlipidemia    Encouraged heart healthy diet, increase exercise, avoid trans fats, consider a krill oil cap daily. Doing well on very low dose Lipitor. Will continue to try and lower and possibly stop the Atorvastatin      Relevant Medications   atorvastatin (LIPITOR) 10 MG tablet   Other Relevant Orders   Lipid panel   Lipid panel   Preventative health care - Primary    Patient encouraged to maintain heart healthy diet, regular exercise, adequate sleep. Consider daily probiotics. Take medications as prescribed      Relevant Orders   CBC   TSH   Abnormal alkaline phosphatase test   Relevant Orders   Comprehensive metabolic panel   Comprehensive metabolic panel      I have discontinued Ms. Crisp's atorvastatin. I have also changed her atorvastatin. Additionally, I am having her maintain her Multiple Vitamins-Minerals (MULTIVITAMIN PO).  Meds ordered this encounter  Medications  . DISCONTD: atorvastatin (LIPITOR) 10 MG tablet    Sig: Take 1 tablet (10 mg total) by mouth daily.    Dispense:  90 tablet    Refill:  1  . atorvastatin (LIPITOR) 10 MG tablet    Sig: Take 0.5 tablets (5 mg total) by mouth every other day.    Dispense:  90 tablet    Refill:  1    CMA served as Education administrator during this visit. History, Physical and Plan performed by medical provider. Documentation and orders reviewed and attested to.  Penni Homans, MD

## 2016-11-04 NOTE — Assessment & Plan Note (Signed)
Encouraged heart healthy diet, increase exercise, avoid trans fats, consider a krill oil cap daily. Doing well on very low dose Lipitor. Will continue to try and lower and possibly stop the Atorvastatin

## 2016-11-04 NOTE — Assessment & Plan Note (Signed)
Patient encouraged to maintain heart healthy diet, regular exercise, adequate sleep. Consider daily probiotics. Take medications as prescribed 

## 2017-02-05 ENCOUNTER — Telehealth: Payer: Self-pay | Admitting: Family Medicine

## 2017-02-05 ENCOUNTER — Other Ambulatory Visit (INDEPENDENT_AMBULATORY_CARE_PROVIDER_SITE_OTHER): Payer: Managed Care, Other (non HMO)

## 2017-02-05 DIAGNOSIS — E782 Mixed hyperlipidemia: Secondary | ICD-10-CM

## 2017-02-05 DIAGNOSIS — R748 Abnormal levels of other serum enzymes: Secondary | ICD-10-CM

## 2017-02-05 DIAGNOSIS — Z0000 Encounter for general adult medical examination without abnormal findings: Secondary | ICD-10-CM

## 2017-02-05 LAB — COMPREHENSIVE METABOLIC PANEL
ALBUMIN: 4.2 g/dL (ref 3.5–5.2)
ALT: 12 U/L (ref 0–35)
AST: 13 U/L (ref 0–37)
Alkaline Phosphatase: 119 U/L — ABNORMAL HIGH (ref 39–117)
BILIRUBIN TOTAL: 0.5 mg/dL (ref 0.2–1.2)
BUN: 11 mg/dL (ref 6–23)
CALCIUM: 9.2 mg/dL (ref 8.4–10.5)
CO2: 27 meq/L (ref 19–32)
CREATININE: 0.88 mg/dL (ref 0.40–1.20)
Chloride: 104 mEq/L (ref 96–112)
GFR: 71.42 mL/min (ref >60.00)
Glucose, Bld: 98 mg/dL (ref 70–99)
Potassium: 3.5 mEq/L (ref 3.5–5.1)
Sodium: 141 mEq/L (ref 135–145)
Total Protein: 6.9 g/dL (ref 6.0–8.3)

## 2017-02-05 LAB — LIPID PANEL
CHOL/HDL RATIO: 4
Cholesterol: 186 mg/dL (ref 0–200)
HDL: 46.2 mg/dL (ref >39.00)
LDL Cholesterol: 105 mg/dL — ABNORMAL HIGH (ref 0–99)
NONHDL: 140.26
Triglycerides: 177 mg/dL — ABNORMAL HIGH (ref 0.0–149.0)
VLDL: 35.4 mg/dL (ref 0.0–40.0)

## 2017-02-05 NOTE — Telephone Encounter (Signed)
Pt dropped off copy of [place] (3 pages) for provider to see and have on pt's chart. Document put at front office tray.

## 2017-02-06 NOTE — Telephone Encounter (Signed)
Documents forwarded to provider for Initials before scan/SLS 08/08

## 2017-02-07 ENCOUNTER — Encounter: Payer: Self-pay | Admitting: Family Medicine

## 2017-03-25 ENCOUNTER — Other Ambulatory Visit: Payer: Self-pay | Admitting: Family Medicine

## 2017-03-25 DIAGNOSIS — Z1231 Encounter for screening mammogram for malignant neoplasm of breast: Secondary | ICD-10-CM

## 2017-05-13 ENCOUNTER — Ambulatory Visit (HOSPITAL_BASED_OUTPATIENT_CLINIC_OR_DEPARTMENT_OTHER)
Admission: RE | Admit: 2017-05-13 | Discharge: 2017-05-13 | Disposition: A | Discharge status: Home / Self Care | Payer: Managed Care, Other (non HMO) | Source: Ambulatory Visit | Attending: Family Medicine | Admitting: Family Medicine

## 2017-05-13 DIAGNOSIS — Z1231 Encounter for screening mammogram for malignant neoplasm of breast: Secondary | ICD-10-CM | POA: Insufficient documentation

## 2017-07-16 DIAGNOSIS — H95191 Other disorders following mastoidectomy, right ear: Secondary | ICD-10-CM

## 2017-07-16 HISTORY — DX: Other disorders following mastoidectomy, right ear: H95.191

## 2017-11-05 ENCOUNTER — Encounter: Payer: Self-pay | Admitting: Family Medicine

## 2017-11-05 ENCOUNTER — Ambulatory Visit (INDEPENDENT_AMBULATORY_CARE_PROVIDER_SITE_OTHER): Payer: Managed Care, Other (non HMO) | Admitting: Family Medicine

## 2017-11-05 VITALS — BP 122/64 | HR 68 | Temp 98.1°F | Resp 16 | Ht 63.0 in | Wt 146.0 lb

## 2017-11-05 DIAGNOSIS — Z0000 Encounter for general adult medical examination without abnormal findings: Secondary | ICD-10-CM

## 2017-11-05 DIAGNOSIS — Z23 Encounter for immunization: Secondary | ICD-10-CM

## 2017-11-05 DIAGNOSIS — E782 Mixed hyperlipidemia: Secondary | ICD-10-CM

## 2017-11-05 DIAGNOSIS — E042 Nontoxic multinodular goiter: Secondary | ICD-10-CM | POA: Diagnosis not present

## 2017-11-05 LAB — CBC
HCT: 43.7 % (ref 36.0–46.0)
Hemoglobin: 14.2 g/dL (ref 12.0–15.0)
MCHC: 32.6 g/dL (ref 30.0–36.0)
MCV: 91.8 fl (ref 78.0–100.0)
Platelets: 370 10*3/uL (ref 150.0–400.0)
RBC: 4.76 Mil/uL (ref 3.87–5.11)
RDW: 13.3 % (ref 11.5–15.5)
WBC: 7.6 10*3/uL (ref 4.0–10.5)

## 2017-11-05 LAB — COMPREHENSIVE METABOLIC PANEL
ALT: 11 U/L (ref 0–35)
AST: 12 U/L (ref 0–37)
Albumin: 4.3 g/dL (ref 3.5–5.2)
Alkaline Phosphatase: 113 U/L (ref 39–117)
BILIRUBIN TOTAL: 0.4 mg/dL (ref 0.2–1.2)
BUN: 12 mg/dL (ref 6–23)
CO2: 30 meq/L (ref 19–32)
CREATININE: 0.82 mg/dL (ref 0.40–1.20)
Calcium: 9.6 mg/dL (ref 8.4–10.5)
Chloride: 103 mEq/L (ref 96–112)
GFR: 77.27 mL/min (ref >60.00)
GLUCOSE: 89 mg/dL (ref 70–99)
Potassium: 4.1 mEq/L (ref 3.5–5.1)
SODIUM: 142 meq/L (ref 135–145)
Total Protein: 7 g/dL (ref 6.0–8.3)

## 2017-11-05 LAB — LIPID PANEL
CHOLESTEROL: 212 mg/dL — AB (ref 0–200)
HDL: 60.5 mg/dL (ref >39.00)
LDL Cholesterol: 126 mg/dL — ABNORMAL HIGH (ref 0–99)
NonHDL: 151.85
TRIGLYCERIDES: 129 mg/dL (ref 0.0–149.0)
Total CHOL/HDL Ratio: 4
VLDL: 25.8 mg/dL (ref 0.0–40.0)

## 2017-11-05 LAB — TSH: TSH: 0.66 u[IU]/mL (ref 0.35–4.50)

## 2017-11-05 NOTE — Assessment & Plan Note (Signed)
Patient encouraged to maintain heart healthy diet, regular exercise, adequate sleep. Consider daily probiotics. Take medications as prescribed. Labs ordered, measles titer. Colonoscopy in 2016 repeat in 2026. MGM due this summer, pap due 2020

## 2017-11-05 NOTE — Assessment & Plan Note (Signed)
Tolerating statin, encouraged heart healthy diet, avoid trans fats, minimize simple carbs and saturated fats. Increase exercise as tolerated. Stopping Lipitor because she is interested in seeing how she does and recheck in 3 to 4 months

## 2017-11-05 NOTE — Assessment & Plan Note (Addendum)
Check thyroid labs

## 2017-11-05 NOTE — Progress Notes (Signed)
Subjective:  I acted as a Education administrator for [employer]. Shawna Johnson, [place]   Patient ID: Shawna Johnson, female    DOB: [birth year], 52 y.o.   MRN: [id number]  Chief Complaint  Patient presents with  . Annual Exam    HPI  Patient is in today for annual exam and she is doing well. No recent febrile illness or hospitalizations. She is trying to stay active and maintain a heart healthy diet. She has been notign some knee pain especially while climbing the stairs. No redness, warmth or swelling. Denies CP/palp/SOB/HA/congestion/fevers/GI or GU c/o. Taking meds as prescribed  Patient Care Team: Mosie Lukes, MD as PCP - General (Family Medicine)   Past Medical History:  Diagnosis Date  . Abnormal alkaline phosphatase test 04/24/2016  . Arthritis   . Elevated alkaline phosphatase level   . Fatty liver   . 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 Hyperlipidemia   . Preventative health care 10/24/2015    Past Surgical History:  Procedure Laterality Date  . CERVICAL CONIZATION W/BX  05/2006  . INNER EAR SURGERY     several childhood  . TONSILLECTOMY AND ADENOIDECTOMY      Family History  Problem Relation Age of Onset  . Heart attack Father   . Hyperlipidemia Father   . Hypertension Father   . Sudden death Father   . Heart disease Father        MI  . Alcohol abuse Father   . Congestive Heart Failure Sister   . COPD Sister   . Heart disease Sister        has pacer/defib in place   . Cardiomyopathy Sister   . Drug abuse Sister        crack in past  . Mental illness Maternal Grandfather        suicide  . Heart disease Paternal Grandmother   . Heart disease Paternal Grandfather   . Rectal cancer Daughter   . Breast cancer Maternal Aunt   . Melanoma Maternal Aunt   . Colon cancer Neg Hx   . Esophageal cancer Neg Hx   . Stomach cancer Neg Hx     Social History   Socioeconomic History  . Marital status: Married    Spouse name: Not on file  . Number of children: 1  . Years of education:  Not on file  . Highest education level: Not on file  Occupational History  . Occupation: Careers adviser  Social Needs  . Financial resource strain: Not on file  . Food insecurity:    Worry: Not on file    Inability: Not on file  . Transportation needs:    Medical: Not on file    Non-medical: Not on file  Tobacco Use  . Smoking status: Former Smoker    Types: E-cigarettes  . Smokeless tobacco: Former Systems developer    Quit date: 04/24/2016  Substance and Sexual Activity  . Alcohol use: Yes    Alcohol/week: 0.0 oz    Comment: Rare  . Drug use: No  . 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 Physical activity:    Days per week: Not on file    Minutes per session: Not on file  . Stress: Not on file  Relationships  . Social connections:    Talks on phone: Not on file    Gets together: Not on file  Attends religious service: Not on file    Active member of club or organization: Not on file    Attends meetings of clubs or organizations: Not on file    Relationship status: Not on file  . Intimate partner violence:    Fear of current or ex partner: Not on file    Emotionally abused: Not on file    Physically abused: Not on file    Forced sexual activity: Not on file  Other Topics Concern  . Not on file  Social History Narrative  . Not on file    Outpatient Medications Prior to Visit  Medication Sig Dispense Refill  . Multiple Vitamins-Minerals (MULTIVITAMIN PO) Take by mouth daily.    Marland Kitchen atorvastatin (LIPITOR) 10 MG tablet Take 0.5 tablets (5 mg total) by mouth every other day. 90 tablet 1   No facility-administered medications prior to visit.     Allergies  Allergen Reactions  . Sulfa Antibiotics Swelling    Review of Systems  Constitutional: Negative for chills, fever and malaise/fatigue.  HENT: Negative for congestion and hearing loss.   Eyes: Negative for  discharge.  Respiratory: Negative for cough, sputum production and shortness of breath.   Cardiovascular: Negative for chest pain, palpitations and leg swelling.  Gastrointestinal: Negative for abdominal pain, blood in stool, constipation, diarrhea, heartburn, nausea and vomiting.  Genitourinary: Negative for dysuria, frequency, hematuria and urgency.  Musculoskeletal: Positive for joint pain. Negative for back pain, falls and myalgias.  Skin: Negative for rash.  Neurological: Negative for dizziness, sensory change, loss of consciousness, weakness and headaches.  Endo/Heme/Allergies: Negative for environmental allergies. Does not bruise/bleed easily.  Psychiatric/Behavioral: Negative for depression and suicidal ideas. The patient is not nervous/anxious and does not have insomnia.        Objective:    Physical Exam  Constitutional: She is oriented to person, place, and time. No distress.  HENT:  Head: Normocephalic and atraumatic.  Right Ear: External ear normal.  Left Ear: External ear normal.  Nose: Nose normal.  Mouth/Throat: Oropharynx is clear and moist. No oropharyngeal exudate.  Eyes: Pupils are equal, round, and reactive to light. Conjunctivae are normal. Right eye exhibits no discharge. Left eye exhibits no discharge. No scleral icterus.  Neck: Normal range of motion. Neck supple. No thyromegaly present.  Cardiovascular: Normal rate, regular rhythm, normal heart sounds and intact distal pulses.  No murmur heard. Pulmonary/Chest: Effort normal and breath sounds normal. No respiratory distress. She has no wheezes. She has no rales.  Abdominal: Soft. Bowel sounds are normal. She exhibits no distension and no mass. There is no tenderness.  Musculoskeletal: Normal range of motion. She exhibits no edema or tenderness.  Lymphadenopathy:    She has no cervical adenopathy.  Neurological: She is alert and oriented to person, place, and time. She has normal reflexes. She displays normal  reflexes. No cranial nerve deficit. Coordination normal.  Skin: Skin is warm and dry. No rash noted. She is not diaphoretic.    BP 122/64 (BP Location: Left Arm, Patient Position: Sitting, Cuff Size: Normal)   Pulse 68   Temp 98.1 F (36.7 C) (Oral)   Resp 16   Ht 5' 3"  (1.6 m)   Wt 146 lb (66.2 kg)   SpO2 97%   BMI 25.86 kg/m  Wt Readings from Last 3 Encounters:  11/05/17 146 lb (66.2 kg)  11/01/16 146 lb 12.8 oz (66.6 kg)  04/24/16 146 lb 12.8 oz (66.6 kg)   BP Readings from Last 3  Encounters:  11/05/17 122/64  11/01/16 92/62  04/24/16 122/72     Immunization History  Administered Date(s) Administered  . Influenza,inj,Quad PF,6+ Mos 06/08/2014, 04/25/2015, 04/24/2016  . PPD Test 08/09/2015  . Tdap 10/24/2015    Health Maintenance  Topic Date Due  . HIV Screening  01/12/1979  . INFLUENZA VACCINE  01/30/2018  . PAP SMEAR  10/24/2018  . MAMMOGRAM  05/14/2019  . COLONOSCOPY  07/20/2024  . TETANUS/TDAP  10/23/2025  . Hepatitis C Screening  Completed    Lab Results  Component Value Date   WBC 7.6 11/05/2017   HGB 14.2 11/05/2017   HCT 43.7 11/05/2017   PLT 370.0 11/05/2017   GLUCOSE 89 11/05/2017   CHOL 212 (H) 11/05/2017   TRIG 129.0 11/05/2017   HDL 60.50 11/05/2017   LDLCALC 126 (H) 11/05/2017   ALT 11 11/05/2017   AST 12 11/05/2017   NA 142 11/05/2017   K 4.1 11/05/2017   CL 103 11/05/2017   CREATININE 0.82 11/05/2017   BUN 12 11/05/2017   CO2 30 11/05/2017   TSH 0.66 11/05/2017    Lab Results  Component Value Date   TSH 0.66 11/05/2017   Lab Results  Component Value Date   WBC 7.6 11/05/2017   HGB 14.2 11/05/2017   HCT 43.7 11/05/2017   MCV 91.8 11/05/2017   PLT 370.0 11/05/2017   Lab Results  Component Value Date   NA 142 11/05/2017   K 4.1 11/05/2017   CO2 30 11/05/2017   GLUCOSE 89 11/05/2017   BUN 12 11/05/2017   CREATININE 0.82 11/05/2017   BILITOT 0.4 11/05/2017   ALKPHOS 113 11/05/2017   AST 12 11/05/2017   ALT 11  11/05/2017   PROT 7.0 11/05/2017   ALBUMIN 4.3 11/05/2017   CALCIUM 9.6 11/05/2017   GFR 77.27 11/05/2017   Lab Results  Component Value Date   CHOL 212 (H) 11/05/2017   Lab Results  Component Value Date   HDL 60.50 11/05/2017   Lab Results  Component Value Date   LDLCALC 126 (H) 11/05/2017   Lab Results  Component Value Date   TRIG 129.0 11/05/2017   Lab Results  Component Value Date   CHOLHDL 4 11/05/2017   No results found for: HGBA1C       Assessment & Plan:   Problem List Items Addressed This Visit    Hyperlipidemia    Tolerating statin, encouraged heart healthy diet, avoid trans fats, minimize simple carbs and saturated fats. Increase exercise as tolerated. Stopping Lipitor because she is interested in seeing how she does and recheck in 3 to 4 months      Relevant Orders   Lipid panel (Completed)   Lipid panel   Comprehensive metabolic panel   Multiple thyroid nodules  see [place] 2015 mm sized in left lobe    Check thyroid labs      Relevant Orders   TSH (Completed)   Preventative health care    Patient encouraged to maintain heart healthy diet, regular exercise, adequate sleep. Consider daily probiotics. Take medications as prescribed. Labs ordered, measles titer. Colonoscopy in 2016 repeat in 2026. MGM due this summer, pap due 2020      Relevant Orders   CBC (Completed)   Comprehensive metabolic panel (Completed)   TSH (Completed)    Other Visit Diagnoses    Need for MMR vaccine    -  Primary   Relevant Orders   Measles ([place]) Antibody IgG (Completed)      I  have discontinued [place] Safer "Ginger"'s atorvastatin. I am also having her maintain her Multiple Vitamins-Minerals (MULTIVITAMIN PO).  No orders of the defined types were placed in this encounter.   CMA served as Education administrator during this visit. History, Physical and Plan performed by medical provider. Documentation and orders reviewed and attested to.  Penni Homans, MD

## 2017-11-05 NOTE — Patient Instructions (Addendum)
shingrix is the new shingles, 2 shots over 2-6 months, check with insurance regarding payment and call for shot if you want the shot   Preventive Care 40-64 Years, Female Preventive care refers to lifestyle choices and visits with your health care provider that can promote health and wellness. What does preventive care include?  A yearly physical exam. This is also called an annual well check.  Dental exams once or twice a year.  Routine eye exams. Ask your health care provider how often you should have your eyes checked.  Personal lifestyle choices, including: ? Daily care of your teeth and gums. ? Regular physical activity. ? Eating a healthy diet. ? Avoiding tobacco and drug use. ? Limiting alcohol use. ? Practicing safe sex. ? Taking low-dose aspirin daily starting at age 23. ? Taking vitamin and mineral supplements as recommended by your health care provider. What happens during an annual well check? The services and screenings done by your health care provider during your annual well check will depend on your age, overall health, lifestyle risk factors, and family history of disease. Counseling Your health care provider may ask you questions about your:  Alcohol use.  Tobacco use.  Drug use.  Emotional well-being.  Home and relationship well-being.  Sexual activity.  Eating habits.  Work and work Statistician.  Method of birth control.  Menstrual cycle.  Pregnancy history.  Screening You may have the following tests or measurements:  Height, weight, and BMI.  Blood pressure.  Lipid and cholesterol levels. These may be checked every 5 years, or more frequently if you are over 92 years old.  Skin check.  Lung cancer screening. You may have this screening every year starting at age 28 if you have a 30-pack-year history of smoking and currently smoke or have quit within the past 15 years.  Fecal occult blood test (FOBT) of the stool. You may have this  test every year starting at age 3.  Flexible sigmoidoscopy or colonoscopy. You may have a sigmoidoscopy every 5 years or a colonoscopy every 10 years starting at age 60.  Hepatitis C blood test.  Hepatitis B blood test.  Sexually transmitted disease (STD) testing.  Diabetes screening. This is done by checking your blood sugar (glucose) after you have not eaten for a while (fasting). You may have this done every 1-3 years.  Mammogram. This may be done every 1-2 years. Talk to your health care provider about when you should start having regular mammograms. This may depend on whether you have a family history of breast cancer.  BRCA-related cancer screening. This may be done if you have a family history of breast, ovarian, tubal, or peritoneal cancers.  Pelvic exam and Pap test. This may be done every 3 years starting at age 62. Starting at age 59, this may be done every 5 years if you have a Pap test in combination with an HPV test.  Bone density scan. This is done to screen for osteoporosis. You may have this scan if you are at high risk for osteoporosis.  Discuss your test results, treatment options, and if necessary, the need for more tests with your health care provider. Vaccines Your health care provider may recommend certain vaccines, such as:  Influenza vaccine. This is recommended every year.  Tetanus, diphtheria, and acellular pertussis (Tdap, Td) vaccine. You may need a Td booster every 10 years.  Varicella vaccine. You may need this if you have not been vaccinated.  Zoster vaccine. You may  need this after age 18.  Measles, mumps, and rubella (MMR) vaccine. You may need at least one dose of MMR if you were born in 1957 or later. You may also need a second dose.  Pneumococcal 13-valent conjugate (PCV13) vaccine. You may need this if you have certain conditions and were not previously vaccinated.  Pneumococcal polysaccharide (PPSV23) vaccine. You may need one or two doses  if you smoke cigarettes or if you have certain conditions.  Meningococcal vaccine. You may need this if you have certain conditions.  Hepatitis A vaccine. You may need this if you have certain conditions or if you travel or work in places where you may be exposed to hepatitis A.  Hepatitis B vaccine. You may need this if you have certain conditions or if you travel or work in places where you may be exposed to hepatitis B.  Haemophilus influenzae type b (Hib) vaccine. You may need this if you have certain conditions.  Talk to your health care provider about which screenings and vaccines you need and how often you need them. This information is not intended to replace advice given to you by your health care provider. Make sure you discuss any questions you have with your health care provider. Document Released: 07/15/2015 Document Revised: 03/07/2016 Document Reviewed: 04/19/2015 Elsevier Interactive Patient Education  2018 [place] calcium intake of 1200 to 1500 mg daily, divided into roughly 3 doses. Best source is the diet and a single dairy serving is about 500 mg, a supplement of calcium citrate once or twice daily to balance diet is fine if not getting enough in diet. Also need Vitamin D 2000 IU caps, 1 cap daily if not already taking vitamin D. Also recommend weight baring exercise on hips and upper body to keep bones strong

## 2017-11-06 LAB — RUBEOLA ANTIBODY IGG: Rubeola IgG: <25 AU/mL — ABNORMAL LOW

## 2017-11-13 ENCOUNTER — Ambulatory Visit: Payer: Self-pay | Admitting: *Deleted

## 2017-11-13 NOTE — Telephone Encounter (Signed)
Pt is scheduled for MMR tomorrow and going on a trip and she is asking about potential side affects.  CDC database info given to here she was advised of the potential mild side affects and and the very remote possibility of an allergic reaction which would be addressed at the office

## 2017-11-14 ENCOUNTER — Ambulatory Visit (INDEPENDENT_AMBULATORY_CARE_PROVIDER_SITE_OTHER): Payer: Managed Care, Other (non HMO)

## 2017-11-14 ENCOUNTER — Other Ambulatory Visit: Payer: Managed Care, Other (non HMO)

## 2017-11-14 DIAGNOSIS — Z23 Encounter for immunization: Secondary | ICD-10-CM

## 2017-11-14 NOTE — Progress Notes (Signed)
Pre visit review using our clinic review tool, if applicable. No additional management support is needed unless otherwise documented below in the visit note.  Pt here today for MMR booster. 0.54m injected into L arm Subq. Pt tolerated injection well.

## 2018-01-30 DIAGNOSIS — H7291 Unspecified perforation of tympanic membrane, right ear: Secondary | ICD-10-CM | POA: Insufficient documentation

## 2018-01-30 DIAGNOSIS — H90A31 Mixed conductive and sensorineural hearing loss, unilateral, right ear with restricted hearing on the contralateral side: Secondary | ICD-10-CM | POA: Insufficient documentation

## 2018-01-30 DIAGNOSIS — H90A22 Sensorineural hearing loss, unilateral, left ear, with restricted hearing on the contralateral side: Secondary | ICD-10-CM | POA: Insufficient documentation

## 2018-03-04 ENCOUNTER — Other Ambulatory Visit (INDEPENDENT_AMBULATORY_CARE_PROVIDER_SITE_OTHER): Payer: Managed Care, Other (non HMO)

## 2018-03-04 DIAGNOSIS — E782 Mixed hyperlipidemia: Secondary | ICD-10-CM

## 2018-03-04 LAB — LIPID PANEL
CHOL/HDL RATIO: 5
CHOLESTEROL: 251 mg/dL — AB (ref 0–200)
HDL: 50.8 mg/dL (ref >39.00)
LDL CALC: 173 mg/dL — AB (ref 0–99)
NonHDL: 200.28
TRIGLYCERIDES: 138 mg/dL (ref 0.0–149.0)
VLDL: 27.6 mg/dL (ref 0.0–40.0)

## 2018-03-04 LAB — COMPREHENSIVE METABOLIC PANEL
ALBUMIN: 4.2 g/dL (ref 3.5–5.2)
ALT: 12 U/L (ref 0–35)
AST: 10 U/L (ref 0–37)
Alkaline Phosphatase: 108 U/L (ref 39–117)
BILIRUBIN TOTAL: 0.4 mg/dL (ref 0.2–1.2)
BUN: 17 mg/dL (ref 6–23)
CALCIUM: 9.2 mg/dL (ref 8.4–10.5)
CHLORIDE: 105 meq/L (ref 96–112)
CO2: 26 mEq/L (ref 19–32)
CREATININE: 0.89 mg/dL (ref 0.40–1.20)
GFR: 70.21 mL/min (ref >60.00)
Glucose, Bld: 89 mg/dL (ref 70–99)
Potassium: 4.3 mEq/L (ref 3.5–5.1)
SODIUM: 141 meq/L (ref 135–145)
Total Protein: 6.4 g/dL (ref 6.0–8.3)

## 2018-03-07 ENCOUNTER — Ambulatory Visit: Payer: Managed Care, Other (non HMO) | Admitting: Family Medicine

## 2018-03-07 ENCOUNTER — Encounter: Payer: Self-pay | Admitting: Family Medicine

## 2018-03-07 VITALS — BP 110/82 | HR 75 | Temp 98.2°F | Resp 18 | Ht 62.5 in | Wt 148.6 lb

## 2018-03-07 DIAGNOSIS — E782 Mixed hyperlipidemia: Secondary | ICD-10-CM

## 2018-03-07 DIAGNOSIS — Z23 Encounter for immunization: Secondary | ICD-10-CM

## 2018-03-07 MED ORDER — ATORVASTATIN CALCIUM 10 MG PO TABS: 5.0000 mg | ORAL_TABLET | Freq: Every day | ORAL | 1 refills | Status: DC

## 2018-03-07 NOTE — Patient Instructions (Signed)
Shingrix is the new shingles shot 2 shots over 2-6 months Cholesterol Cholesterol is a white, waxy, fat-like substance that is needed by the human body in small amounts. The liver makes all the cholesterol we need. Cholesterol is carried from the liver by the blood through the blood vessels. Deposits of cholesterol (plaques) may build up on blood vessel (artery) walls. Plaques make the arteries narrower and stiffer. Cholesterol plaques increase the risk for heart attack and stroke. You cannot feel your cholesterol level even if it is very high. The only way to know that it is high is to have a blood test. Once you know your cholesterol levels, you should keep a record of the test results. Work with your health care provider to keep your levels in the desired range. What do the results mean?  Total cholesterol is a rough measure of all the cholesterol in your blood.  LDL (low-density lipoprotein) is the "bad" cholesterol. This is the type that causes plaque to build up on the artery walls. You want this level to be low.  HDL (high-density lipoprotein) is the "good" cholesterol because it cleans the arteries and carries the LDL away. You want this level to be high.  Triglycerides are fat that the body can either burn for energy or store. High levels are closely linked to heart disease. What are the desired levels of cholesterol?  Total cholesterol below 200.  LDL below 100 for people who are at risk, below 70 for people at very high risk.  HDL above 40 is good. A level of 60 or higher is considered to be protective against heart disease.  Triglycerides below 150. How can I lower my cholesterol? Diet Follow your diet program as told by your health care provider.  Choose fish or white meat chicken and [place], roasted or baked. Limit fatty cuts of red meat, fried foods, and processed meats, such as sausage and lunch meats.  Eat lots of fresh fruits and vegetables.  Choose whole grains,  beans, pasta, potatoes, and cereals.  Choose olive oil, corn oil, or canola oil, and use only small amounts.  Avoid butter, mayonnaise, shortening, or palm kernel oils.  Avoid foods with trans fats.  Drink skim or nonfat milk and eat low-fat or nonfat yogurt and cheeses. Avoid whole milk, cream, ice cream, egg yolks, and full-fat cheeses.  Healthier desserts include angel food cake, ginger snaps, animal crackers, hard candy, popsicles, and low-fat or nonfat frozen yogurt. Avoid pastries, cakes, pies, and cookies.  Exercise  Follow your exercise program as told by your health care provider. A regular program: ? Helps to decrease LDL and raise HDL. ? Helps with weight control.  Do things that increase your activity level, such as gardening, walking, and taking the stairs.  Ask your health care provider about ways that you can be more active in your daily life.  Medicine  Take over-the-counter and prescription medicines only as told by your health care provider. ? Medicine may be prescribed by your health care provider to help lower cholesterol and decrease the risk for heart disease. This is usually done if diet and exercise have failed to bring down cholesterol levels. ? If you have several risk factors, you may need medicine even if your levels are normal.  This information is not intended to replace advice given to you by your health care provider. Make sure you discuss any questions you have with your health care provider. Document Released: 03/13/2001 Document Revised: 01/14/2016 Document Reviewed:  12/17/2015 Elsevier Interactive Patient Education  [employer].

## 2018-03-07 NOTE — Progress Notes (Signed)
Subjective:  I acted as a Education administrator for Dr. Charlett Blake. [place], [place]  Patient ID: Shawna Johnson, female    DOB: 06-13-1966, 52 y.o.   MRN: [id number]  No chief complaint on file.   HPI  Patient is in today for 4 month follow up and she feels well today. She had hoped to stop her statin but after stopping her cholesterol went up so she is willing to restart. She did not have any side effects with the medications. Denies CP/palp/SOB/HA/congestion/fevers/GI or GU c/o. Taking meds as prescribed  Patient Care Team: Mosie Lukes, MD as PCP - General (Family Medicine)   Past Medical History:  Diagnosis Date  . Abnormal alkaline phosphatase test 04/24/2016  . Arthritis   . Elevated alkaline phosphatase level   . Fatty liver   . 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 Hyperlipidemia   . Preventative health care 10/24/2015    Past Surgical History:  Procedure Laterality Date  . CERVICAL CONIZATION W/BX  05/2006  . INNER EAR SURGERY     several childhood  . TONSILLECTOMY AND ADENOIDECTOMY      Family History  Problem Relation Age of Onset  . Heart attack Father   . Hyperlipidemia Father   . Hypertension Father   . Sudden death Father   . Heart disease Father        MI  . Alcohol abuse Father   . Congestive Heart Failure Sister   . COPD Sister   . Heart disease Sister        has pacer/defib in place   . Cardiomyopathy Sister   . Drug abuse Sister        crack in past  . Mental illness Maternal Grandfather        suicide  . Heart disease Paternal Grandmother   . Heart disease Paternal Grandfather   . Rectal cancer Daughter   . Breast cancer Maternal Aunt   . Melanoma Maternal Aunt   . Colon cancer Neg Hx   . Esophageal cancer Neg Hx   . Stomach cancer Neg Hx     Social History   Socioeconomic History  . Marital status: Married    Spouse name: Not on file  . Number of children: 1  . Years of education: Not on file  . Highest education level: Not on file  Occupational  History  . Occupation: Careers adviser  Social Needs  . Financial resource strain: Not on file  . Food insecurity:    Worry: Not on file    Inability: Not on file  . Transportation needs:    Medical: Not on file    Non-medical: Not on file  Tobacco Use  . Smoking status: Former Smoker    Types: E-cigarettes  . Smokeless tobacco: Former Systems developer    Quit date: 04/24/2016  Substance and Sexual Activity  . Alcohol use: Yes    Alcohol/week: 0.0 standard drinks    Comment: Rare  . Drug use: No  . 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 Physical activity:    Days per week: Not on file    Minutes per session: Not on file  . Stress: Not on file  Relationships  . Social connections:    Talks on phone: Not on file    Gets together: Not on file    Attends religious service: Not on file  Active member of club or organization: Not on file    Attends meetings of clubs or organizations: Not on file    Relationship status: Not on file  . Intimate partner violence:    Fear of current or ex partner: Not on file    Emotionally abused: Not on file    Physically abused: Not on file    Forced sexual activity: Not on file  Other Topics Concern  . Not on file  Social History Narrative  . Not on file    Outpatient Medications Prior to Visit  Medication Sig Dispense Refill  . Multiple Vitamins-Minerals (MULTIVITAMIN PO) Take by mouth daily.     No facility-administered medications prior to visit.     Allergies  Allergen Reactions  . Sulfa Antibiotics Swelling    Review of Systems  Constitutional: Negative for fever and malaise/fatigue.  HENT: Negative for congestion.   Eyes: Negative for blurred vision.  Respiratory: Negative for shortness of breath.   Cardiovascular: Negative for chest pain, palpitations and leg swelling.  Gastrointestinal: Negative for abdominal pain,  blood in stool and nausea.  Genitourinary: Negative for dysuria and frequency.  Musculoskeletal: Negative for falls.  Skin: Negative for rash.  Neurological: Negative for dizziness, loss of consciousness and headaches.  Endo/Heme/Allergies: Negative for environmental allergies.  Psychiatric/Behavioral: Negative for depression. The patient is not nervous/anxious.        Objective:    Physical Exam  Constitutional: She is oriented to person, place, and time. She appears well-developed and well-nourished. No distress.  HENT:  Head: Normocephalic and atraumatic.  Nose: Nose normal.  Eyes: Right eye exhibits no discharge. Left eye exhibits no discharge.  Neck: Normal range of motion. Neck supple.  Cardiovascular: Normal rate and regular rhythm.  No murmur heard. Pulmonary/Chest: Effort normal and breath sounds normal.  Abdominal: Soft. Bowel sounds are normal. There is no tenderness.  Musculoskeletal: She exhibits no edema.  Neurological: She is alert and oriented to person, place, and time.  Skin: Skin is warm and dry.  Psychiatric: She has a normal mood and affect.  Nursing note and vitals reviewed.   BP 110/82 (BP Location: Left Arm, Patient Position: Sitting, Cuff Size: Normal)   Pulse 75   Temp 98.2 F (36.8 C) (Oral)   Resp 18   Ht 5' 2.5" (1.588 m)   Wt 148 lb 9.6 oz (67.4 kg)   SpO2 98%   BMI 26.75 kg/m  Wt Readings from Last 3 Encounters:  03/07/18 148 lb 9.6 oz (67.4 kg)  11/05/17 146 lb (66.2 kg)  11/01/16 146 lb 12.8 oz (66.6 kg)   BP Readings from Last 3 Encounters:  03/07/18 110/82  11/05/17 122/64  11/01/16 92/62     Immunization History  Administered Date(s) Administered  . Influenza,inj,Quad PF,6+ Mos 06/08/2014, 04/25/2015, 04/24/2016, 03/07/2018  . MMR 11/14/2017  . PPD Test 08/09/2015  . Tdap 10/24/2015    Health Maintenance  Topic Date Due  . HIV Screening  01/12/1979  . PAP SMEAR  10/24/2018  . MAMMOGRAM  05/14/2019  . COLONOSCOPY   07/20/2024  . TETANUS/TDAP  10/23/2025  . INFLUENZA VACCINE  Completed  . Hepatitis C Screening  Completed    Lab Results  Component Value Date   WBC 7.6 11/05/2017   HGB 14.2 11/05/2017   HCT 43.7 11/05/2017   PLT 370.0 11/05/2017   GLUCOSE 89 03/04/2018   CHOL 251 (H) 03/04/2018   TRIG 138.0 03/04/2018   HDL 50.80 03/04/2018   LDLCALC 173 (  H) 03/04/2018   ALT 12 03/04/2018   AST 10 03/04/2018   NA 141 03/04/2018   K 4.3 03/04/2018   CL 105 03/04/2018   CREATININE 0.89 03/04/2018   BUN 17 03/04/2018   CO2 26 03/04/2018   TSH 0.66 11/05/2017    Lab Results  Component Value Date   TSH 0.66 11/05/2017   Lab Results  Component Value Date   WBC 7.6 11/05/2017   HGB 14.2 11/05/2017   HCT 43.7 11/05/2017   MCV 91.8 11/05/2017   PLT 370.0 11/05/2017   Lab Results  Component Value Date   NA 141 03/04/2018   K 4.3 03/04/2018   CO2 26 03/04/2018   GLUCOSE 89 03/04/2018   BUN 17 03/04/2018   CREATININE 0.89 03/04/2018   BILITOT 0.4 03/04/2018   ALKPHOS 108 03/04/2018   AST 10 03/04/2018   ALT 12 03/04/2018   PROT 6.4 03/04/2018   ALBUMIN 4.2 03/04/2018   CALCIUM 9.2 03/04/2018   GFR 70.21 03/04/2018   Lab Results  Component Value Date   CHOL 251 (H) 03/04/2018   Lab Results  Component Value Date   HDL 50.80 03/04/2018   Lab Results  Component Value Date   LDLCALC 173 (H) 03/04/2018   Lab Results  Component Value Date   TRIG 138.0 03/04/2018   Lab Results  Component Value Date   CHOLHDL 5 03/04/2018   No results found for: HGBA1C       Assessment & Plan:   Problem List Items Addressed This Visit    Hyperlipidemia    Encouraged heart healthy diet, increase exercise, avoid trans fats, consider a krill oil cap daily. She had hoped to come off of he statin and is here today to restart it since her numbers went up so much. Will start Atorvastatin 10 mg po qhs. Spent 15 minutes face to face with the patient counseling and developing a treatment  plan      Relevant Medications   atorvastatin (LIPITOR) 10 MG tablet    Other Visit Diagnoses    Needs flu shot    -  Primary   Relevant Orders   Flu Vaccine QUAD 6+ mos PF IM (Fluarix Quad PF) (Completed)      I have changed [place] [facility] "Ginger"'s atorvastatin. I am also having her maintain her Multiple Vitamins-Minerals (MULTIVITAMIN PO).  Meds ordered this encounter  Medications  . atorvastatin (LIPITOR) 10 MG tablet    Sig: Take 0.5 tablets (5 mg total) by mouth daily.    Dispense:  45 tablet    Refill:  1    CMA served as scribe during this visit. History, Physical and Plan performed by medical provider. Documentation and orders reviewed and attested to.  Penni Homans, MD

## 2018-03-08 NOTE — Assessment & Plan Note (Addendum)
Encouraged heart healthy diet, increase exercise, avoid trans fats, consider a krill oil cap daily. She had hoped to come off of he statin and is here today to restart it since her numbers went up so much. Will start Atorvastatin 10 mg po qhs. Spent 15 minutes face to face with the patient counseling and developing a treatment plan

## 2018-03-31 ENCOUNTER — Other Ambulatory Visit: Payer: Self-pay | Admitting: Family Medicine

## 2018-03-31 DIAGNOSIS — Z1231 Encounter for screening mammogram for malignant neoplasm of breast: Secondary | ICD-10-CM

## 2018-05-13 ENCOUNTER — Ambulatory Visit (HOSPITAL_BASED_OUTPATIENT_CLINIC_OR_DEPARTMENT_OTHER)
Admission: RE | Admit: 2018-05-13 | Discharge: 2018-05-13 | Disposition: A | Discharge status: Home / Self Care | Payer: 59 | Source: Ambulatory Visit | Attending: Family Medicine | Admitting: Family Medicine

## 2018-05-13 ENCOUNTER — Encounter (HOSPITAL_BASED_OUTPATIENT_CLINIC_OR_DEPARTMENT_OTHER): Payer: Self-pay

## 2018-05-13 DIAGNOSIS — Z1231 Encounter for screening mammogram for malignant neoplasm of breast: Secondary | ICD-10-CM | POA: Insufficient documentation

## 2018-07-04 ENCOUNTER — Telehealth: Payer: Self-pay | Admitting: *Deleted

## 2018-07-04 ENCOUNTER — Other Ambulatory Visit: Payer: Self-pay | Admitting: Family Medicine

## 2018-07-04 DIAGNOSIS — K760 Fatty (change of) liver, not elsewhere classified: Secondary | ICD-10-CM

## 2018-07-04 DIAGNOSIS — R748 Abnormal levels of other serum enzymes: Secondary | ICD-10-CM

## 2018-07-04 DIAGNOSIS — E042 Nontoxic multinodular goiter: Secondary | ICD-10-CM

## 2018-07-04 DIAGNOSIS — E782 Mixed hyperlipidemia: Secondary | ICD-10-CM

## 2018-07-04 NOTE — Telephone Encounter (Signed)
Pt is scheduled to see you on 07/11/18 and has scheduled a lab appointment for 07/08/18.  I do not see any future orders in Epic.  Please place future orders if appropriate or advise if lab appt is not needed at this time.

## 2018-07-04 NOTE — Telephone Encounter (Signed)
done

## 2018-07-08 ENCOUNTER — Other Ambulatory Visit (INDEPENDENT_AMBULATORY_CARE_PROVIDER_SITE_OTHER): Payer: Managed Care, Other (non HMO)

## 2018-07-08 DIAGNOSIS — R748 Abnormal levels of other serum enzymes: Secondary | ICD-10-CM

## 2018-07-08 DIAGNOSIS — K760 Fatty (change of) liver, not elsewhere classified: Secondary | ICD-10-CM

## 2018-07-08 DIAGNOSIS — E782 Mixed hyperlipidemia: Secondary | ICD-10-CM

## 2018-07-08 DIAGNOSIS — E042 Nontoxic multinodular goiter: Secondary | ICD-10-CM | POA: Diagnosis not present

## 2018-07-08 LAB — COMPREHENSIVE METABOLIC PANEL
ALBUMIN: 4.4 g/dL (ref 3.5–5.2)
ALT: 15 U/L (ref 0–35)
AST: 15 U/L (ref 0–37)
Alkaline Phosphatase: 117 U/L (ref 39–117)
BUN: 14 mg/dL (ref 6–23)
CO2: 27 meq/L (ref 19–32)
CREATININE: 0.82 mg/dL (ref 0.40–1.20)
Calcium: 9.4 mg/dL (ref 8.4–10.5)
Chloride: 105 mEq/L (ref 96–112)
GFR: 77.07 mL/min (ref >60.00)
Glucose, Bld: 85 mg/dL (ref 70–99)
Potassium: 3.8 mEq/L (ref 3.5–5.1)
SODIUM: 141 meq/L (ref 135–145)
Total Bilirubin: 0.4 mg/dL (ref 0.2–1.2)
Total Protein: 6.5 g/dL (ref 6.0–8.3)

## 2018-07-08 LAB — CBC
HCT: 44.3 % (ref 36.0–46.0)
Hemoglobin: 14.6 g/dL (ref 12.0–15.0)
MCHC: 33 g/dL (ref 30.0–36.0)
MCV: 90.3 fl (ref 78.0–100.0)
Platelets: 355 10*3/uL (ref 150.0–400.0)
RBC: 4.9 Mil/uL (ref 3.87–5.11)
RDW: 14.1 % (ref 11.5–15.5)
WBC: 7.3 10*3/uL (ref 4.0–10.5)

## 2018-07-08 LAB — LIPID PANEL
CHOL/HDL RATIO: 3
CHOLESTEROL: 206 mg/dL — AB (ref 0–200)
HDL: 60.8 mg/dL (ref >39.00)
LDL CALC: 118 mg/dL — AB (ref 0–99)
NonHDL: 144.76
Triglycerides: 133 mg/dL (ref 0.0–149.0)
VLDL: 26.6 mg/dL (ref 0.0–40.0)

## 2018-07-08 LAB — TSH: TSH: 1.34 u[IU]/mL (ref 0.35–4.50)

## 2018-07-11 ENCOUNTER — Ambulatory Visit: Payer: Managed Care, Other (non HMO) | Admitting: Family Medicine

## 2018-08-25 ENCOUNTER — Encounter: Payer: Self-pay | Admitting: Family Medicine

## 2018-08-25 ENCOUNTER — Ambulatory Visit: Payer: Managed Care, Other (non HMO) | Admitting: Family Medicine

## 2018-08-25 VITALS — BP 108/70 | HR 66 | Temp 97.9°F | Resp 18 | Ht 63.0 in | Wt 151.0 lb

## 2018-08-25 DIAGNOSIS — K760 Fatty (change of) liver, not elsewhere classified: Secondary | ICD-10-CM | POA: Diagnosis not present

## 2018-08-25 DIAGNOSIS — E782 Mixed hyperlipidemia: Secondary | ICD-10-CM

## 2018-08-25 DIAGNOSIS — R921 Mammographic calcification found on diagnostic imaging of breast: Secondary | ICD-10-CM

## 2018-08-25 DIAGNOSIS — E042 Nontoxic multinodular goiter: Secondary | ICD-10-CM | POA: Diagnosis not present

## 2018-08-25 DIAGNOSIS — Z0000 Encounter for general adult medical examination without abnormal findings: Secondary | ICD-10-CM

## 2018-08-25 NOTE — Patient Instructions (Addendum)
CDC Travel site    Shingrix is the new shingles shot can come in for nurse call and they will help you pick a time Cholesterol Cholesterol is a white, waxy, fat-like substance that is needed by the human body in small amounts. The liver makes all the cholesterol we need. Cholesterol is carried from the liver by the blood through the blood vessels. Deposits of cholesterol (plaques) may build up on blood vessel (artery) walls. Plaques make the arteries narrower and stiffer. Cholesterol plaques increase the risk for heart attack and stroke. You cannot feel your cholesterol level even if it is very high. The only way to know that it is high is to have a blood test. Once you know your cholesterol levels, you should keep a record of the test results. Work with your health care provider to keep your levels in the desired range. What do the results mean?  Total cholesterol is a rough measure of all the cholesterol in your blood.  LDL (low-density lipoprotein) is the "bad" cholesterol. This is the type that causes plaque to build up on the artery walls. You want this level to be low.  HDL (high-density lipoprotein) is the "good" cholesterol because it cleans the arteries and carries the LDL away. You want this level to be high.  Triglycerides are fat that the body can either burn for energy or store. High levels are closely linked to heart disease. What are the desired levels of cholesterol?  Total cholesterol below 200.  LDL below 100 for people who are at risk, below 70 for people at very high risk.  HDL above 40 is good. A level of 60 or higher is considered to be protective against heart disease.  Triglycerides below 150. How can I lower my cholesterol? Diet Follow your diet program as told by your health care provider.  Choose fish or white meat chicken and [place], roasted or baked. Limit fatty cuts of red meat, fried foods, and processed meats, such as sausage and lunch meats.  Eat lots of  fresh fruits and vegetables.  Choose whole grains, beans, pasta, potatoes, and cereals.  Choose olive oil, corn oil, or canola oil, and use only small amounts.  Avoid butter, mayonnaise, shortening, or palm kernel oils.  Avoid foods with trans fats.  Drink skim or nonfat milk and eat low-fat or nonfat yogurt and cheeses. Avoid whole milk, cream, ice cream, egg yolks, and full-fat cheeses.  Healthier desserts include angel food cake, ginger snaps, animal crackers, hard candy, popsicles, and low-fat or nonfat frozen yogurt. Avoid pastries, cakes, pies, and cookies.  Exercise  Follow your exercise program as told by your health care provider. A regular program: ? Helps to decrease LDL and raise HDL. ? Helps with weight control.  Do things that increase your activity level, such as gardening, walking, and taking the stairs.  Ask your health care provider about ways that you can be more active in your daily life. Medicine  Take over-the-counter and prescription medicines only as told by your health care provider. ? Medicine may be prescribed by your health care provider to help lower cholesterol and decrease the risk for heart disease. This is usually done if diet and exercise have failed to bring down cholesterol levels. ? If you have several risk factors, you may need medicine even if your levels are normal. This information is not intended to replace advice given to you by your health care provider. Make sure you discuss any questions you have  with your health care provider. Document Released: 03/13/2001 Document Revised: 01/14/2016 Document Reviewed: 12/17/2015 Elsevier Interactive Patient Education  [employer].

## 2018-08-25 NOTE — Assessment & Plan Note (Signed)
Minimize carbohydrates and increase activity

## 2018-08-25 NOTE — Assessment & Plan Note (Signed)
Encouraged heart healthy diet, increase exercise, avoid trans fats, consider a krill oil cap daily 

## 2018-08-25 NOTE — Progress Notes (Signed)
Subjective:    Patient ID: Shawna Johnson, female    DOB: 04-20-1966, 53 y.o.   MRN: [id number]  No chief complaint on file.   HPI Patient is in today for follow up. She feels well. No recent febrile illness or hospitalizations. No polyuria or polydipsia. No acute concerns. Denies CP/palp/SOB/HA/congestion/fevers/GI or GU c/o. Taking meds as prescribed  Past Medical History:  Diagnosis Date  . Abnormal alkaline phosphatase test 04/24/2016  . Arthritis   . Elevated alkaline phosphatase level   . Fatty liver   . 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 Hyperlipidemia   . Preventative health care 10/24/2015    Past Surgical History:  Procedure Laterality Date  . CERVICAL CONIZATION W/BX  05/2006  . INNER EAR SURGERY     several childhood  . TONSILLECTOMY AND ADENOIDECTOMY      Family History  Problem Relation Age of Onset  . Heart attack Father   . Hyperlipidemia Father   . Hypertension Father   . Sudden death Father   . Heart disease Father        MI  . Alcohol abuse Father   . Congestive Heart Failure Sister   . COPD Sister   . Heart disease Sister        has pacer/defib in place   . Cardiomyopathy Sister   . Drug abuse Sister        crack in past  . Mental illness Maternal Grandfather        suicide  . Heart disease Paternal Grandmother   . Heart disease Paternal Grandfather   . Rectal cancer Daughter   . Breast cancer Maternal Aunt   . Melanoma Maternal Aunt   . Colon cancer Neg Hx   . Esophageal cancer Neg Hx   . Stomach cancer Neg Hx     Social History   Socioeconomic History  . Marital status: Married    Spouse name: Not on file  . Number of children: 1  . Years of education: Not on file  . Highest education level: Not on file  Occupational History  . Occupation: Careers adviser  Social Needs  . Financial resource strain: Not on file  . Food insecurity:    Worry: Not on file    Inability: Not on file  . Transportation needs:    Medical: Not on file   Non-medical: Not on file  Tobacco Use  . Smoking status: Former Smoker    Types: E-cigarettes  . Smokeless tobacco: Former Systems developer    Quit date: 04/24/2016  Substance and Sexual Activity  . Alcohol use: Yes    Alcohol/week: 0.0 standard drinks    Comment: Rare  . Drug use: No  . 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 Physical activity:    Days per week: Not on file    Minutes per session: Not on file  . Stress: Not on file  Relationships  . Social connections:    Talks on phone: Not on file    Gets together: Not on file    Attends religious service: Not on file    Active member of club or organization: Not on file    Attends meetings of clubs or organizations: Not on file    Relationship status: Not on file  . Intimate partner violence:    Fear of current or ex partner: Not on file  Emotionally abused: Not on file    Physically abused: Not on file    Forced sexual activity: Not on file  Other Topics Concern  . Not on file  Social History Narrative  . Not on file    Outpatient Medications Prior to Visit  Medication Sig Dispense Refill  . atorvastatin (LIPITOR) 10 MG tablet Take 0.5 tablets (5 mg total) by mouth daily. 45 tablet 1  . calcium carbonate (OSCAL) 1500 (600 Ca) MG TABS tablet Take by mouth 2 (two) times daily with a meal.    . Multiple Vitamins-Minerals (MULTIVITAMIN PO) Take by mouth daily.     No facility-administered medications prior to visit.     Allergies  Allergen Reactions  . Sulfa Antibiotics Swelling    Review of Systems  Constitutional: Negative for fever and malaise/fatigue.  HENT: Negative for congestion.   Eyes: Negative for blurred vision.  Respiratory: Negative for shortness of breath.   Cardiovascular: Negative for chest pain, palpitations and leg swelling.  Gastrointestinal: Negative for abdominal pain, blood in  stool and nausea.  Genitourinary: Negative for dysuria and frequency.  Musculoskeletal: Negative for falls.  Skin: Negative for rash.  Neurological: Negative for dizziness, loss of consciousness and headaches.  Endo/Heme/Allergies: Negative for environmental allergies.  Psychiatric/Behavioral: Negative for depression. The patient is not nervous/anxious.        Objective:    Physical Exam Vitals signs and nursing note reviewed.  Constitutional:      General: She is not in acute distress.    Appearance: She is well-developed.  HENT:     Head: Normocephalic and atraumatic.     Nose: Nose normal.  Eyes:     General:        Right eye: No discharge.        Left eye: No discharge.  Neck:     Musculoskeletal: Normal range of motion and neck supple.  Cardiovascular:     Rate and Rhythm: Normal rate and regular rhythm.     Heart sounds: No murmur.  Pulmonary:     Effort: Pulmonary effort is normal.     Breath sounds: Normal breath sounds.  Abdominal:     General: Bowel sounds are normal.     Palpations: Abdomen is soft.     Tenderness: There is no abdominal tenderness.  Skin:    General: Skin is warm and dry.  Neurological:     Mental Status: She is alert and oriented to person, place, and time.     BP 108/70 (BP Location: Left Arm, Patient Position: Sitting, Cuff Size: Normal)   Pulse 66   Temp 97.9 F (36.6 C) (Oral)   Resp 18   Ht 5\' 3"  (1.6 m)   Wt 151 lb (68.5 kg)   SpO2 97%   BMI 26.75 kg/m  Wt Readings from Last 3 Encounters:  08/25/18 151 lb (68.5 kg)  03/07/18 148 lb 9.6 oz (67.4 kg)  11/05/17 146 lb (66.2 kg)     Lab Results  Component Value Date   WBC 7.3 07/08/2018   HGB 14.6 07/08/2018   HCT 44.3 07/08/2018   PLT 355.0 07/08/2018   GLUCOSE 85 07/08/2018   CHOL 206 (H) 07/08/2018   TRIG 133.0 07/08/2018   HDL 60.80 07/08/2018   LDLCALC 118 (H) 07/08/2018   ALT 15 07/08/2018   AST 15 07/08/2018   NA 141 07/08/2018   K 3.8 07/08/2018   CL 105  07/08/2018   CREATININE 0.82 07/08/2018   BUN 14  07/08/2018   CO2 27 07/08/2018   TSH 1.34 07/08/2018    Lab Results  Component Value Date   TSH 1.34 07/08/2018   Lab Results  Component Value Date   WBC 7.3 07/08/2018   HGB 14.6 07/08/2018   HCT 44.3 07/08/2018   MCV 90.3 07/08/2018   PLT 355.0 07/08/2018   Lab Results  Component Value Date   NA 141 07/08/2018   K 3.8 07/08/2018   CO2 27 07/08/2018   GLUCOSE 85 07/08/2018   BUN 14 07/08/2018   CREATININE 0.82 07/08/2018   BILITOT 0.4 07/08/2018   ALKPHOS 117 07/08/2018   AST 15 07/08/2018   ALT 15 07/08/2018   PROT 6.5 07/08/2018   ALBUMIN 4.4 07/08/2018   CALCIUM 9.4 07/08/2018   GFR 77.07 07/08/2018   Lab Results  Component Value Date   CHOL 206 (H) 07/08/2018   Lab Results  Component Value Date   HDL 60.80 07/08/2018   Lab Results  Component Value Date   LDLCALC 118 (H) 07/08/2018   Lab Results  Component Value Date   TRIG 133.0 07/08/2018   Lab Results  Component Value Date   CHOLHDL 3 07/08/2018   No results found for: HGBA1C     Assessment & Plan:   Problem List Items Addressed This Visit    Hyperlipidemia - Primary    Encouraged heart healthy diet, increase exercise, avoid trans fats, consider a krill oil cap daily      Relevant Orders   Lipid panel   Breast calcifications on mammogram  R breast stable for 18 months [place]   Multiple thyroid nodules  see [place] 2015 mm sized in left lobe   Relevant Orders   TSH   Hepatic steatosis    Minimize carbohydrates and increase activity      Relevant Orders   Comprehensive metabolic panel   Preventative health care   Relevant Orders   CBC      I have discontinued [place] Swango "Ginger"'s Multiple Vitamins-Minerals (MULTIVITAMIN PO). I am also having her maintain her atorvastatin and calcium carbonate.  No orders of the defined types were placed in this encounter.    Penni Homans, MD

## 2018-10-24 ENCOUNTER — Other Ambulatory Visit: Payer: Self-pay | Admitting: Family Medicine

## 2018-11-07 ENCOUNTER — Telehealth: Payer: Self-pay | Admitting: Family Medicine

## 2018-11-07 MED ORDER — ATORVASTATIN CALCIUM 10 MG PO TABS: ORAL_TABLET | ORAL | 1 refills | Status: DC

## 2018-11-07 NOTE — Telephone Encounter (Signed)
Refills sent

## 2018-11-07 NOTE — Telephone Encounter (Signed)
Copied from [place] [id number]. Topic: Quick Communication - See Telephone Encounter >> Nov 07, 2018 10:04 AM Rutherford Nail, NT wrote: CRM for notification. See Telephone encounter for: 11/07/18. Patient calling and states that [place] had reached out to the office regarding the patient's atorvastatin (LIPITOR) 10 MG tablet. States that she received a letter from pharmacy stating that the order had been delayed due to not having the information needed from the office. Patient would like this prescription sent to the mail order pharmacy. Please advise. [place], [place] CB#: [phone]

## 2018-11-28 ENCOUNTER — Other Ambulatory Visit: Payer: Managed Care, Other (non HMO)

## 2018-12-05 ENCOUNTER — Other Ambulatory Visit (INDEPENDENT_AMBULATORY_CARE_PROVIDER_SITE_OTHER): Payer: Managed Care, Other (non HMO)

## 2018-12-05 ENCOUNTER — Other Ambulatory Visit: Payer: Self-pay

## 2018-12-05 DIAGNOSIS — E042 Nontoxic multinodular goiter: Secondary | ICD-10-CM | POA: Diagnosis not present

## 2018-12-05 DIAGNOSIS — Z0000 Encounter for general adult medical examination without abnormal findings: Secondary | ICD-10-CM

## 2018-12-05 DIAGNOSIS — K760 Fatty (change of) liver, not elsewhere classified: Secondary | ICD-10-CM | POA: Diagnosis not present

## 2018-12-05 DIAGNOSIS — E782 Mixed hyperlipidemia: Secondary | ICD-10-CM | POA: Diagnosis not present

## 2018-12-05 LAB — CBC
HCT: 41.9 % (ref 36.0–46.0)
Hemoglobin: 14 g/dL (ref 12.0–15.0)
MCHC: 33.4 g/dL (ref 30.0–36.0)
MCV: 90.7 fl (ref 78.0–100.0)
Platelets: 351 10*3/uL (ref 150.0–400.0)
RBC: 4.62 Mil/uL (ref 3.87–5.11)
RDW: 13.2 % (ref 11.5–15.5)
WBC: 6.9 10*3/uL (ref 4.0–10.5)

## 2018-12-05 LAB — COMPREHENSIVE METABOLIC PANEL
ALT: 14 U/L (ref 0–35)
AST: 14 U/L (ref 0–37)
Albumin: 4.3 g/dL (ref 3.5–5.2)
Alkaline Phosphatase: 114 U/L (ref 39–117)
BUN: 14 mg/dL (ref 6–23)
CO2: 26 mEq/L (ref 19–32)
Calcium: 9.2 mg/dL (ref 8.4–10.5)
Chloride: 105 mEq/L (ref 96–112)
Creatinine, Ser: 0.81 mg/dL (ref 0.40–1.20)
GFR: 73.44 mL/min (ref >60.00)
Glucose, Bld: 91 mg/dL (ref 70–99)
Potassium: 4 mEq/L (ref 3.5–5.1)
Sodium: 142 mEq/L (ref 135–145)
Total Bilirubin: 0.5 mg/dL (ref 0.2–1.2)
Total Protein: 6.4 g/dL (ref 6.0–8.3)

## 2018-12-05 LAB — LIPID PANEL
Cholesterol: 185 mg/dL (ref 0–200)
HDL: 57 mg/dL (ref >39.00)
LDL Cholesterol: 104 mg/dL — ABNORMAL HIGH (ref 0–99)
NonHDL: 127.83
Total CHOL/HDL Ratio: 3
Triglycerides: 118 mg/dL (ref 0.0–149.0)
VLDL: 23.6 mg/dL (ref 0.0–40.0)

## 2018-12-05 LAB — TSH: TSH: 0.82 u[IU]/mL (ref 0.35–4.50)

## 2018-12-09 ENCOUNTER — Other Ambulatory Visit: Payer: Self-pay

## 2018-12-09 ENCOUNTER — Ambulatory Visit (INDEPENDENT_AMBULATORY_CARE_PROVIDER_SITE_OTHER): Payer: Managed Care, Other (non HMO) | Admitting: Family Medicine

## 2018-12-09 ENCOUNTER — Encounter: Payer: Self-pay | Admitting: Family Medicine

## 2018-12-09 DIAGNOSIS — Z0000 Encounter for general adult medical examination without abnormal findings: Secondary | ICD-10-CM | POA: Diagnosis not present

## 2018-12-09 DIAGNOSIS — B977 Papillomavirus as the cause of diseases classified elsewhere: Secondary | ICD-10-CM

## 2018-12-09 DIAGNOSIS — R002 Palpitations: Secondary | ICD-10-CM | POA: Insufficient documentation

## 2018-12-09 DIAGNOSIS — E782 Mixed hyperlipidemia: Secondary | ICD-10-CM

## 2018-12-09 NOTE — Assessment & Plan Note (Signed)
Notes palpitations for years but over past 1-2 months they have increased in frequency. She had started drinking coffee and was noting episodes of palpitations lasting 1-2 minutes, 1-2 x daily. Then as she has cut down on caffeine and switched to decaf they are decreasing in frequency. She will continue to monitor and if they escalate again or change she will let us know and we will refer her to cardiology for further consideration.

## 2018-12-09 NOTE — Assessment & Plan Note (Signed)
Encouraged heart healthy diet, increase exercise, avoid trans fats, consider a krill oil cap daily. Tolerating Atorvastatin 

## 2018-12-09 NOTE — Assessment & Plan Note (Signed)
Patient encouraged to maintain heart healthy diet, regular exercise, adequate sleep. Consider daily probiotics. Take medications as prescribed. Labs reviewed. MGM due in fall encouraged to wait 18 months to June unless symptoms develop

## 2018-12-09 NOTE — Assessment & Plan Note (Signed)
Was 15 years ago and normal with regular surveillance ever since. No concerns today. Will forego pap this year during the pandemic and arrange a pap for next year's CPE and if she develops any concerns between now and then we can proceed with pap

## 2018-12-09 NOTE — Progress Notes (Signed)
Virtual Visit via Video Note  I connected with [name] on 12/09/18 at  9:40 AM EDT by a video enabled telemedicine application and verified that I am speaking with the correct person using two identifiers.  Location: Patient: home Provider: home   I discussed the limitations of evaluation and management by telemedicine and the availability of in person appointments. The patient expressed understanding and agreed to proceed.    Subjective:    Patient ID: Shawna Johnson, female    DOB: 10-22-1965, 53 y.o.   MRN: [id number]  No chief complaint on file.   HPI Patient is in today for annual preventative. She is doing well. No recent febrile illness or hospitalizations. 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 She is trying to maintain a heart healthy diet and is exercising well. Denies CP/SOB/HA/congestion/fevers/GI or GU c/o. Taking meds as prescribed. Notes palpitations for years but over past 1-2 months they have increased in frequency. She had started drinking coffee and was noting episodes of palpitations lasting 1-2 minutes, 1-2 x daily. Then as she has cut down on caffeine and switched to decaf they are decreasing in frequency.  Past Medical History:  Diagnosis Date  . Abnormal alkaline phosphatase test 04/24/2016  . Arthritis   . Elevated alkaline phosphatase level   . Fatty liver   . 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 Hyperlipidemia   . Preventative health care 10/24/2015    Past Surgical History:  Procedure Laterality Date  . CERVICAL CONIZATION W/BX  05/2006  . INNER EAR SURGERY     several childhood  . TONSILLECTOMY AND ADENOIDECTOMY      Family History  Problem Relation Age of Onset  . Heart attack Father   . Hyperlipidemia Father   . Hypertension Father   . Sudden death Father   . Heart disease Father        MI  . Alcohol abuse Father   . Congestive  Heart Failure Sister   . COPD Sister   . Heart disease Sister        has pacer/defib in place   . Cardiomyopathy Sister   . Drug abuse Sister        crack in past  . Mental illness Maternal Grandfather        suicide  . Heart disease Paternal Grandmother   . Heart disease Paternal Grandfather   . Rectal cancer Daughter   . Breast cancer Maternal Aunt   . Melanoma Maternal Aunt   . Colon cancer Neg Hx   . Esophageal cancer Neg Hx   . Stomach cancer Neg Hx     Social History   Socioeconomic History  . Marital status: Married    Spouse name: Not on file  . Number of children: 1  . Years of education: Not on file  . Highest education level: Not on file  Occupational History  . Occupation: Careers adviser  Social Needs  . Financial resource strain: Not on file  . Food insecurity:    Worry: Not on file    Inability: Not on file  . Transportation needs:    Medical: Not on file    Non-medical: Not on file  Tobacco Use  . Smoking status: Former Smoker    Types: E-cigarettes  . Smokeless tobacco: Former Systems developer    Quit date: 04/24/2016  Substance and Sexual Activity  .  Alcohol use: Yes    Alcohol/week: 0.0 standard drinks    Comment: Rare  . Drug use: No  . 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 Physical activity:    Days per week: Not on file    Minutes per session: Not on file  . Stress: Not on file  Relationships  . Social connections:    Talks on phone: Not on file    Gets together: Not on file    Attends religious service: Not on file    Active member of club or organization: Not on file    Attends meetings of clubs or organizations: Not on file    Relationship status: Not on file  . Intimate partner violence:    Fear of current or ex partner: Not on file    Emotionally abused: Not on file    Physically abused: Not on file    Forced sexual activity:  Not on file  Other Topics Concern  . Not on file  Social History Narrative  . Not on file    Outpatient Medications Prior to Visit  Medication Sig Dispense Refill  . atorvastatin (LIPITOR) 10 MG tablet TAKE 1/2 TABLET(5 MG) BY MOUTH DAILY 45 tablet 1  . calcium carbonate (OSCAL) 1500 (600 Ca) MG TABS tablet Take by mouth 2 (two) times daily with a meal.     No facility-administered medications prior to visit.     Allergies  Allergen Reactions  . Sulfa Antibiotics Swelling    Review of Systems  Constitutional: Negative for fever and malaise/fatigue.  HENT: Negative for congestion.   Eyes: Negative for blurred vision.  Respiratory: Negative for shortness of breath.   Cardiovascular: Positive for palpitations. Negative for chest pain and leg swelling.  Gastrointestinal: Negative for abdominal pain, blood in stool and nausea.  Genitourinary: Negative for dysuria and frequency.  Musculoskeletal: Negative for falls.  Skin: Negative for rash.  Neurological: Negative for dizziness, loss of consciousness and headaches.  Endo/Heme/Allergies: Negative for environmental allergies.  Psychiatric/Behavioral: Negative for depression. The patient is not nervous/anxious.        Objective:    Physical Exam Constitutional:      Appearance: Normal appearance. She is not ill-appearing.  HENT:     Head: Normocephalic and atraumatic.     Nose: Nose normal.  Eyes:     General:        Right eye: No discharge.        Left eye: No discharge.  Pulmonary:     Effort: Pulmonary effort is normal.     Breath sounds: Normal breath sounds.  Neurological:     Mental Status: She is alert and oriented to person, place, and time.  Psychiatric:        Mood and Affect: Mood normal.        Behavior: Behavior normal.     Pulse 90   Wt 146 lb (66.2 kg)   BMI 25.86 kg/m  Wt Readings from Last 3 Encounters:  12/09/18 146 lb (66.2 kg)  08/25/18 151 lb (68.5 kg)  03/07/18 148 lb 9.6 oz (67.4 kg)     Diabetic Foot Exam - Simple   No data filed     Lab Results  Component Value Date   WBC 6.9 12/05/2018   HGB 14.0 12/05/2018   HCT 41.9 12/05/2018   PLT 351.0 12/05/2018  GLUCOSE 91 12/05/2018   CHOL 185 12/05/2018   TRIG 118.0 12/05/2018   HDL 57.00 12/05/2018   LDLCALC 104 (H) 12/05/2018   ALT 14 12/05/2018   AST 14 12/05/2018   NA 142 12/05/2018   K 4.0 12/05/2018   CL 105 12/05/2018   CREATININE 0.81 12/05/2018   BUN 14 12/05/2018   CO2 26 12/05/2018   TSH 0.82 12/05/2018    Lab Results  Component Value Date   TSH 0.82 12/05/2018   Lab Results  Component Value Date   WBC 6.9 12/05/2018   HGB 14.0 12/05/2018   HCT 41.9 12/05/2018   MCV 90.7 12/05/2018   PLT 351.0 12/05/2018   Lab Results  Component Value Date   NA 142 12/05/2018   K 4.0 12/05/2018   CO2 26 12/05/2018   GLUCOSE 91 12/05/2018   BUN 14 12/05/2018   CREATININE 0.81 12/05/2018   BILITOT 0.5 12/05/2018   ALKPHOS 114 12/05/2018   AST 14 12/05/2018   ALT 14 12/05/2018   PROT 6.4 12/05/2018   ALBUMIN 4.3 12/05/2018   CALCIUM 9.2 12/05/2018   GFR 73.44 12/05/2018   Lab Results  Component Value Date   CHOL 185 12/05/2018   Lab Results  Component Value Date   HDL 57.00 12/05/2018   Lab Results  Component Value Date   LDLCALC 104 (H) 12/05/2018   Lab Results  Component Value Date   TRIG 118.0 12/05/2018   Lab Results  Component Value Date   CHOLHDL 3 12/05/2018   No results found for: HGBA1C     Assessment & Plan:   Problem List Items Addressed This Visit    HPV in female    Was 15 years ago and normal with regular surveillance ever since. No concerns today. Will forego pap this year during the pandemic and arrange a pap for next year's CPE and if she develops any concerns between now and then we can proceed with pap      Hyperlipidemia    Encouraged heart healthy diet, increase exercise, avoid trans fats, consider a krill oil cap daily. Tolerating Atorvastatin       Preventative health care    Patient encouraged to maintain heart healthy diet, regular exercise, adequate sleep. Consider daily probiotics. Take medications as prescribed. Labs reviewed. MGM due in fall encouraged to wait 18 months to June unless symptoms develop         I am having [place] Kronenberger "Ginger" maintain her calcium carbonate and atorvastatin.  No orders of the defined types were placed in this encounter.   I discussed the assessment and treatment plan with the patient. The patient was provided an opportunity to ask questions and all were answered. The patient agreed with the plan and demonstrated an understanding of the instructions.   The patient was advised to call back or seek an in-person evaluation if the symptoms worsen or if the condition fails to improve as anticipated.  I provided 25 minutes of non-face-to-face time during this encounter.   Penni Homans, MD

## 2019-04-08 ENCOUNTER — Other Ambulatory Visit: Payer: Self-pay

## 2019-04-08 DIAGNOSIS — Z20822 Contact with and (suspected) exposure to covid-19: Secondary | ICD-10-CM

## 2019-04-10 LAB — NOVEL CORONAVIRUS, NAA: SARS-CoV-2, NAA: NOT DETECTED

## 2019-04-22 ENCOUNTER — Other Ambulatory Visit (HOSPITAL_BASED_OUTPATIENT_CLINIC_OR_DEPARTMENT_OTHER): Payer: Self-pay | Admitting: Family Medicine

## 2019-04-22 DIAGNOSIS — Z1231 Encounter for screening mammogram for malignant neoplasm of breast: Secondary | ICD-10-CM

## 2019-05-03 ENCOUNTER — Other Ambulatory Visit: Payer: Self-pay | Admitting: Family Medicine

## 2019-05-15 ENCOUNTER — Other Ambulatory Visit: Payer: Self-pay

## 2019-05-15 ENCOUNTER — Ambulatory Visit (HOSPITAL_BASED_OUTPATIENT_CLINIC_OR_DEPARTMENT_OTHER)
Admission: RE | Admit: 2019-05-15 | Discharge: 2019-05-15 | Disposition: A | Discharge status: Home / Self Care | Payer: Managed Care, Other (non HMO) | Source: Ambulatory Visit | Attending: Family Medicine | Admitting: Family Medicine

## 2019-05-15 DIAGNOSIS — Z1231 Encounter for screening mammogram for malignant neoplasm of breast: Secondary | ICD-10-CM | POA: Insufficient documentation

## 2019-12-10 ENCOUNTER — Encounter: Payer: Self-pay | Admitting: Family Medicine

## 2019-12-10 ENCOUNTER — Other Ambulatory Visit (HOSPITAL_COMMUNITY)
Admission: RE | Admit: 2019-12-10 | Discharge: 2019-12-10 | Disposition: A | Discharge status: Home / Self Care | Payer: Managed Care, Other (non HMO) | Source: Ambulatory Visit | Attending: Family Medicine | Admitting: Family Medicine

## 2019-12-10 ENCOUNTER — Ambulatory Visit (INDEPENDENT_AMBULATORY_CARE_PROVIDER_SITE_OTHER): Payer: Managed Care, Other (non HMO) | Admitting: Family Medicine

## 2019-12-10 ENCOUNTER — Other Ambulatory Visit: Payer: Self-pay

## 2019-12-10 VITALS — BP 122/68 | HR 66 | Temp 97.9°F | Resp 12 | Ht 62.5 in | Wt 150.8 lb

## 2019-12-10 DIAGNOSIS — R252 Cramp and spasm: Secondary | ICD-10-CM | POA: Diagnosis not present

## 2019-12-10 DIAGNOSIS — Z124 Encounter for screening for malignant neoplasm of cervix: Secondary | ICD-10-CM

## 2019-12-10 DIAGNOSIS — E782 Mixed hyperlipidemia: Secondary | ICD-10-CM

## 2019-12-10 DIAGNOSIS — Z0000 Encounter for general adult medical examination without abnormal findings: Secondary | ICD-10-CM | POA: Diagnosis not present

## 2019-12-10 LAB — CBC
HCT: 41.8 % (ref 36.0–46.0)
Hemoglobin: 13.7 g/dL (ref 12.0–15.0)
MCHC: 32.8 g/dL (ref 30.0–36.0)
MCV: 91 fl (ref 78.0–100.0)
Platelets: 349 10*3/uL (ref 150.0–400.0)
RBC: 4.59 Mil/uL (ref 3.87–5.11)
RDW: 13.5 % (ref 11.5–15.5)
WBC: 7.3 10*3/uL (ref 4.0–10.5)

## 2019-12-10 LAB — COMPREHENSIVE METABOLIC PANEL
ALT: 15 U/L (ref 0–35)
AST: 15 U/L (ref 0–37)
Albumin: 4.5 g/dL (ref 3.5–5.2)
Alkaline Phosphatase: 117 U/L (ref 39–117)
BUN: 14 mg/dL (ref 6–23)
CO2: 30 mEq/L (ref 19–32)
Calcium: 9.5 mg/dL (ref 8.4–10.5)
Chloride: 102 mEq/L (ref 96–112)
Creatinine, Ser: 0.76 mg/dL (ref 0.40–1.20)
GFR: 78.75 mL/min (ref >60.00)
Glucose, Bld: 92 mg/dL (ref 70–99)
Potassium: 4.1 mEq/L (ref 3.5–5.1)
Sodium: 139 mEq/L (ref 135–145)
Total Bilirubin: 0.4 mg/dL (ref 0.2–1.2)
Total Protein: 6.7 g/dL (ref 6.0–8.3)

## 2019-12-10 LAB — LIPID PANEL
Cholesterol: 187 mg/dL (ref 0–200)
HDL: 53.4 mg/dL (ref >39.00)
LDL Cholesterol: 113 mg/dL — ABNORMAL HIGH (ref 0–99)
NonHDL: 133.64
Total CHOL/HDL Ratio: 4
Triglycerides: 103 mg/dL (ref 0.0–149.0)
VLDL: 20.6 mg/dL (ref 0.0–40.0)

## 2019-12-10 LAB — TSH: TSH: 0.86 u[IU]/mL (ref 0.35–4.50)

## 2019-12-10 LAB — MAGNESIUM: Magnesium: 2.2 mg/dL (ref 1.5–2.5)

## 2019-12-10 NOTE — Assessment & Plan Note (Signed)
Encouraged heart healthy diet, increase exercise, avoid trans fats, consider a krill oil cap daily 

## 2019-12-10 NOTE — Patient Instructions (Signed)

## 2019-12-10 NOTE — Assessment & Plan Note (Signed)
Patient encouraged to maintain heart healthy diet, regular exercise, adequate sleep. Consider daily probiotics. Take medications as prescribed. Labs ordered and reviewed 

## 2019-12-10 NOTE — Progress Notes (Signed)
Subjective:    Patient ID: Shawna Johnson, female    DOB: 05-08-1966, 54 y.o.   MRN: [id number]  Chief Complaint  Patient presents with   Annual Exam    HPI Patient is in today for annual preventative exam. No recent febrile illness or hospitalizations. No acute concerns. Does endorse some fatigue and myalgias. She has tried to maintain a heart healthy diet and stay active. Denies CP/palp/SOB/HA/congestion/fevers/GI or GU c/o. Taking meds as prescribed. No gyn concerns or breast concerns.   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   Hyperlipidemia    Preventative health care 10/24/2015    Past Surgical History:  Procedure Laterality Date   CERVICAL CONIZATION W/BX  05/2006   INNER EAR SURGERY     several childhood   TONSILLECTOMY AND ADENOIDECTOMY      Family History  Problem Relation Age of Onset   Heart attack Father    Hyperlipidemia Father    Hypertension Father    Sudden death Father    Heart disease Father        MI   Alcohol abuse Father    Congestive Heart Failure Sister    COPD Sister    Heart disease Sister        has pacer/defib in place    Cardiomyopathy Sister    Drug abuse Sister        crack in past   Mental illness Maternal Grandfather        suicide   Heart disease Paternal Grandmother    Heart disease Paternal Grandfather    Rectal cancer Daughter    Breast cancer Maternal Aunt    Melanoma Maternal Aunt    Colon cancer Neg Hx    Esophageal cancer Neg Hx    Stomach cancer Neg Hx     Social History   Socioeconomic History   Marital status: Married    Spouse name: Not on file   Number of children: 1   Years of education: Not on file   Highest education level: Not on file  Occupational History   Occupation: Stylist  Tobacco Use   Smoking status: Former  Smoker    Types: E-cigarettes   Smokeless tobacco: Former Systems developer    Quit date: 04/24/2016  Vaping Use   Vaping Use: Never used  Substance and Sexual Activity   Alcohol use: Yes    Alcohol/week: 0.0 standard drinks    Comment: Rare   Drug use: No   Sexual activity: Yes    Partners: Male    Comment: lives with husband, no dietary restrictions, works as Emergency planning/management officer, 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  Other Topics Concern   Not on file  Social History Narrative   Not on file   Social Determinants of Health   Financial Resource Strain:    Difficulty of Paying Living Expenses:   Food Insecurity:    Worried About Charity fundraiser in the Last Year:    Arboriculturist in the Last Year:   Transportation Needs:    Film/video editor (Medical):    Lack of Transportation (Non-Medical):   Physical Activity:    Days of Exercise per Week:    Minutes of Exercise per Session:   Stress:    Feeling of Stress :   Social Connections:    Frequency  of Communication with Friends and Family:    Frequency of Social Gatherings with Friends and Family:    Attends Religious Services:    Active Member of Clubs or Organizations:    Attends Music therapist:    Marital Status:   Intimate Partner Violence:    Fear of Current or Ex-Partner:    Emotionally Abused:    Physically Abused:    Sexually Abused:     Outpatient Medications Prior to Visit  Medication Sig Dispense Refill   atorvastatin (LIPITOR) 10 MG tablet TAKE ONE-HALF (1/2) TABLET BY MOUTH DAILY 45 tablet 3   calcium carbonate (OSCAL) 1500 (600 Ca) MG TABS tablet Take by mouth 2 (two) times daily with a meal.     No facility-administered medications prior to visit.    Allergies  Allergen Reactions   Sulfa Antibiotics Swelling    Review of Systems  Constitutional: Positive for malaise/fatigue. Negative for fever.  HENT: Negative for congestion.   Eyes: Negative for blurred vision.    Respiratory: Negative for shortness of breath.   Cardiovascular: Negative for chest pain, palpitations and leg swelling.  Gastrointestinal: Negative for abdominal pain, blood in stool and nausea.  Genitourinary: Negative for dysuria and frequency.  Musculoskeletal: Positive for myalgias. Negative for falls.  Skin: Negative for rash.  Neurological: Negative for dizziness, loss of consciousness and headaches.  Endo/Heme/Allergies: Negative for environmental allergies.  Psychiatric/Behavioral: Negative for depression. The patient is not nervous/anxious.        Objective:    Physical Exam Constitutional:      General: She is not in acute distress.    Appearance: She is not diaphoretic.  HENT:     Head: Normocephalic and atraumatic.     Right Ear: External ear normal.     Left Ear: External ear normal.     Nose: Nose normal.     Mouth/Throat:     Pharynx: No oropharyngeal exudate.  Eyes:     General: No scleral icterus.       Right eye: No discharge.        Left eye: No discharge.     Conjunctiva/sclera: Conjunctivae normal.     Pupils: Pupils are equal, round, and reactive to light.  Neck:     Thyroid: No thyromegaly.  Cardiovascular:     Rate and Rhythm: Normal rate and regular rhythm.     Heart sounds: Normal heart sounds. No murmur heard.   Pulmonary:     Effort: Pulmonary effort is normal. No respiratory distress.     Breath sounds: Normal breath sounds. No wheezing or rales.  Abdominal:     General: Bowel sounds are normal. There is no distension.     Palpations: Abdomen is soft. There is no mass.     Tenderness: There is no abdominal tenderness.  Genitourinary:    General: Normal vulva.     Vagina: No vaginal discharge.     Rectum: Normal.  Musculoskeletal:        General: No tenderness. Normal range of motion.     Cervical back: Normal range of motion and neck supple.  Lymphadenopathy:     Cervical: No cervical adenopathy.  Skin:    General: Skin is warm and  dry.     Findings: No rash.  Neurological:     Mental Status: She is alert and oriented to person, place, and time.     Cranial Nerves: No cranial nerve deficit.     Coordination: Coordination normal.  Deep Tendon Reflexes: Reflexes are normal and symmetric. Reflexes normal.     BP 122/68 (BP Location: Left Arm, Cuff Size: Normal)    Pulse 66    Temp 97.9 F (36.6 C) (Temporal)    Resp 12    Ht 5' 2.5" (1.588 m)    Wt 150 lb 12.8 oz (68.4 kg)    SpO2 98%    BMI 27.14 kg/m  Wt Readings from Last 3 Encounters:  12/10/19 150 lb 12.8 oz (68.4 kg)  12/09/18 146 lb (66.2 kg)  08/25/18 151 lb (68.5 kg)    Diabetic Foot Exam - Simple   No data filed     Lab Results  Component Value Date   WBC 7.3 12/10/2019   HGB 13.7 12/10/2019   HCT 41.8 12/10/2019   PLT 349.0 12/10/2019   GLUCOSE 92 12/10/2019   CHOL 187 12/10/2019   TRIG 103.0 12/10/2019   HDL 53.40 12/10/2019   LDLCALC 113 (H) 12/10/2019   ALT 15 12/10/2019   AST 15 12/10/2019   NA 139 12/10/2019   K 4.1 12/10/2019   CL 102 12/10/2019   CREATININE 0.76 12/10/2019   BUN 14 12/10/2019   CO2 30 12/10/2019   TSH 0.86 12/10/2019    Lab Results  Component Value Date   TSH 0.86 12/10/2019   Lab Results  Component Value Date   WBC 7.3 12/10/2019   HGB 13.7 12/10/2019   HCT 41.8 12/10/2019   MCV 91.0 12/10/2019   PLT 349.0 12/10/2019   Lab Results  Component Value Date   NA 139 12/10/2019   K 4.1 12/10/2019   CO2 30 12/10/2019   GLUCOSE 92 12/10/2019   BUN 14 12/10/2019   CREATININE 0.76 12/10/2019   BILITOT 0.4 12/10/2019   ALKPHOS 117 12/10/2019   AST 15 12/10/2019   ALT 15 12/10/2019   PROT 6.7 12/10/2019   ALBUMIN 4.5 12/10/2019   CALCIUM 9.5 12/10/2019   GFR 78.75 12/10/2019   Lab Results  Component Value Date   CHOL 187 12/10/2019   Lab Results  Component Value Date   HDL 53.40 12/10/2019   Lab Results  Component Value Date   LDLCALC 113 (H) 12/10/2019   Lab Results  Component Value  Date   TRIG 103.0 12/10/2019   Lab Results  Component Value Date   CHOLHDL 4 12/10/2019   No results found for: HGBA1C     Assessment & Plan:   Problem List Items Addressed This Visit    Hyperlipidemia    Encouraged heart healthy diet, increase exercise, avoid trans fats, consider a krill oil cap daily      Relevant Orders   Lipid panel (Completed)   Preventative health care    Patient encouraged to maintain heart healthy diet, regular exercise, adequate sleep. Consider daily probiotics. Take medications as prescribed. Labs ordered and reviewed.       Relevant Orders   CBC (Completed)   Comprehensive metabolic panel (Completed)   TSH (Completed)   Muscle cramps    Hydrate and check labs. Consider Magnesium Glycinate.       Relevant Orders   Magnesium (Completed)   Cervical cancer screening - Primary    Pap today, no concerns on exam.       Relevant Orders   Cytology - PAP( Gregory) (Completed)      I am having Vermont Hussar "Ginger" maintain her calcium carbonate and atorvastatin.  No orders of the defined types were placed in this encounter.  Penni Homans, MD

## 2019-12-11 LAB — CYTOLOGY - PAP: Diagnosis: NEGATIVE

## 2019-12-14 DIAGNOSIS — Z124 Encounter for screening for malignant neoplasm of cervix: Secondary | ICD-10-CM | POA: Insufficient documentation

## 2019-12-14 DIAGNOSIS — R252 Cramp and spasm: Secondary | ICD-10-CM | POA: Insufficient documentation

## 2019-12-14 NOTE — Assessment & Plan Note (Signed)
Hydrate and check labs. Consider Magnesium Glycinate.

## 2019-12-14 NOTE — Assessment & Plan Note (Signed)
Pap today, no concerns on exam.  

## 2020-02-07 ENCOUNTER — Other Ambulatory Visit: Payer: Self-pay

## 2020-02-07 ENCOUNTER — Encounter (HOSPITAL_BASED_OUTPATIENT_CLINIC_OR_DEPARTMENT_OTHER): Payer: Self-pay | Admitting: *Deleted

## 2020-02-07 ENCOUNTER — Emergency Department (HOSPITAL_BASED_OUTPATIENT_CLINIC_OR_DEPARTMENT_OTHER)
Admission: EM | Admit: 2020-02-07 | Discharge: 2020-02-08 | Disposition: A | Discharge status: Home / Self Care | Payer: Managed Care, Other (non HMO) | Attending: Emergency Medicine | Admitting: Emergency Medicine

## 2020-02-07 DIAGNOSIS — Y939 Activity, unspecified: Secondary | ICD-10-CM | POA: Insufficient documentation

## 2020-02-07 DIAGNOSIS — T18128A Food in esophagus causing other injury, initial encounter: Secondary | ICD-10-CM | POA: Diagnosis not present

## 2020-02-07 DIAGNOSIS — W458XXA Other foreign body or object entering through skin, initial encounter: Secondary | ICD-10-CM | POA: Insufficient documentation

## 2020-02-07 DIAGNOSIS — Y999 Unspecified external cause status: Secondary | ICD-10-CM | POA: Diagnosis not present

## 2020-02-07 DIAGNOSIS — Y929 Unspecified place or not applicable: Secondary | ICD-10-CM | POA: Diagnosis not present

## 2020-02-07 DIAGNOSIS — Z87891 Personal history of nicotine dependence: Secondary | ICD-10-CM | POA: Diagnosis not present

## 2020-02-07 NOTE — ED Notes (Signed)
PO challenge given  

## 2020-02-07 NOTE — ED Notes (Signed)
Pt states she believes the food is no longer stuck in her esophagus. States she feels much better

## 2020-02-07 NOTE — ED Triage Notes (Signed)
Pt reports she has food stuck in esophagus after eating pork chop earlier this evening. States she has vomited several times and has seen small pieces of it. States she is able to drink but vomits it back up. She has taken gas-x and alka seltzer; tried eating a banana and drinking a soda without relief. Speech clear. No respiratory distress

## 2020-02-07 NOTE — ED Notes (Signed)
EDP Horton at bedside

## 2020-02-08 ENCOUNTER — Encounter: Payer: Self-pay | Admitting: Internal Medicine

## 2020-02-08 MED ORDER — OMEPRAZOLE 20 MG PO CPDR: 20.0000 mg | DELAYED_RELEASE_CAPSULE | Freq: Every day | ORAL | 0 refills | Status: DC

## 2020-02-08 NOTE — ED Provider Notes (Signed)
[facility] EMERGENCY DEPARTMENT Provider Note   CSN: [id number] Arrival date & time: 02/07/20  2116     History Chief Complaint  Patient presents with  . Swallowed Foreign Body    Shawna Johnson is a 54 y.o. female.  HPI     This a 54 year old female with a history of hyperlipidemia who presents with food bolus.  Patient reports that she was eating pork chops around 630 when a piece of pork chop got stuck.  She reports multiple episodes of emesis.  She states that she tried to drink a soft drink and Alka-Seltzer with no improvement.  She states that recently, she has had difficulty specifically with eating meats and chicken with similar symptoms where she felt like the meat got stuck.  However, it has never been this bad.  However, just prior to my evaluation, she had a episode of forceful emesis and feels that she dislodged the piece of meat.  She no longer has a foreign body sensation.  She denies chest pain or shortness of breath.  No known history of reflux.  She is never had an endoscopy.  She is followed by Dr. Hilarie Fredrickson, GI for routine colonoscopy.  At this time she has no complaints.  Past Medical History:  Diagnosis Date  . Abnormal alkaline phosphatase test 04/24/2016  . Arthritis   . Elevated alkaline phosphatase level   . Fatty liver   . 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 Hyperlipidemia   . Preventative health care 10/24/2015    Patient Active Problem List   Diagnosis Date Noted  . Muscle cramps 12/14/2019  . Cervical cancer screening 12/14/2019  . Palpitations 12/09/2018  . 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 Abnormal alkaline phosphatase test 04/24/2016  . Preventative health care 10/24/2015  . Arthritis   . Hepatic steatosis 08/23/2014  . Multiple thyroid nodules  see U/S 2015 mm sized in left lobe 05/16/2014  . Breast calcifications on mammogram  R breast stable for 18 months [facility] 01/07/2014  . Breast nodule  decreasing size mm 09/2013 [facility] 01/07/2014  . 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 Abnormal Pap smear of cervix  S/P Leep normal pap since 2007 12/22/2013  . Hyperlipidemia 12/22/2013  . Left shoulder pain 10/08/2013  . Left hip pain 10/08/2013  . Right hip pain 10/08/2013    Past Surgical History:  Procedure Laterality Date  . CERVICAL CONIZATION W/BX  05/2006  . INNER EAR SURGERY     several childhood  . TONSILLECTOMY AND ADENOIDECTOMY       OB History    Gravida  1   Para  1   Term  1   Preterm      AB      Living  1     SAB      TAB      Ectopic      Multiple      Live Births              Family History  Problem Relation Age of Onset  . Heart attack Father   . Hyperlipidemia Father   . Hypertension Father   . Sudden death Father   . Heart disease Father        MI  . Alcohol abuse Father   . Congestive Heart Failure Sister   . COPD Sister   . Heart disease Sister        has pacer/defib in place   . Cardiomyopathy Sister   .  Drug abuse Sister        crack in past  . Mental illness Maternal Grandfather        suicide  . Heart disease Paternal Grandmother   . Heart disease Paternal Grandfather   . Rectal cancer Daughter   . Breast cancer Maternal Aunt   . Melanoma Maternal Aunt   . Colon cancer Neg Hx   . Esophageal cancer Neg Hx   . Stomach cancer Neg Hx     Social History   Tobacco Use  . Smoking status: Former Smoker    Types: E-cigarettes  . Smokeless tobacco: Former Systems developer    Quit date: 04/24/2016  Vaping Use  . Vaping Use: Never used  Substance Use Topics  . Alcohol use: Yes    Alcohol/week: 0.0 standard drinks    Comment: Rare  . Drug use: No    Home Medications Prior to Admission medications   Medication Sig Start Date End Date Taking? Authorizing Provider  atorvastatin (LIPITOR) 10 MG tablet TAKE ONE-HALF (1/2) TABLET BY MOUTH DAILY 05/04/19  Yes Mosie Lukes, MD  calcium carbonate (OSCAL) 1500 (600 Ca) MG TABS tablet Take by  mouth 2 (two) times daily with a meal.   Yes [provider]  omeprazole (PRILOSEC) 20 MG capsule Take 1 capsule (20 mg total) by mouth daily. 02/08/20   Rockell Faulks, Barbette Hair, MD    Allergies    Sulfa antibiotics  Review of Systems   Review of Systems  Constitutional: Negative for fever.  Respiratory: Negative for shortness of breath.   Cardiovascular: Negative for chest pain.  Gastrointestinal:       Food bolus, impaction  Genitourinary: Negative for dysuria.  All other systems reviewed and are negative.   Physical Exam Updated Vital Signs BP (!) 141/83 (BP Location: Right Arm)   Pulse (!) 58   Temp 98.8 F (37.1 C) (Oral)   Resp 20   Ht 1.588 m (5' 2.5")   Wt 67.1 kg   SpO2 97%   BMI 26.64 kg/m   Physical Exam Vitals and nursing note reviewed.  Constitutional:      Appearance: She is well-developed. She is not ill-appearing.  HENT:     Head: Normocephalic and atraumatic.     Nose: Nose normal.     Mouth/Throat:     Mouth: Mucous membranes are moist.  Eyes:     Pupils: Pupils are equal, round, and reactive to light.  Cardiovascular:     Rate and Rhythm: Normal rate and regular rhythm.     Heart sounds: Normal heart sounds.  Pulmonary:     Effort: Pulmonary effort is normal. No respiratory distress.     Breath sounds: No wheezing.  Abdominal:     Palpations: Abdomen is soft.     Tenderness: There is no abdominal tenderness.  Musculoskeletal:     Cervical back: Neck supple.     Right lower leg: No edema.     Left lower leg: No edema.  Skin:    General: Skin is warm and dry.  Neurological:     Mental Status: She is alert and oriented to person, place, and time.  Psychiatric:        Mood and Affect: Mood normal.     ED Results / Procedures / Treatments   Labs (all labs ordered are listed, but only abnormal results are displayed) Labs Reviewed - No data to display  EKG None  Radiology No results found.  Procedures Procedures (including  critical care time)  Medications Ordered in ED Medications - No data to display  ED Course  I have reviewed the triage vital signs and the nursing notes.  Pertinent labs & imaging results that were available during my care of the patient were reviewed by me and considered in my medical decision making (see chart for details).    MDM Rules/Calculators/A&P                           Patient presents with probable food impaction.  Prior to my evaluation she vomited and feels she dislodged it.  She is currently without complaint.  Her exam is reassuring.  She does report recent similar symptoms suggestive of possible chronic issue such as stricture or web.  She does not take any medications for reflux.  Will start on omeprazole.  I have encouraged her to call Dr. Vena Rua office for close follow-up and EGD.  I also instructed her that she needs to be careful when eating meats.  Patient was able to tolerate fluids without difficulty prior to discharge.  After history, exam, and medical workup I feel the patient has been appropriately medically screened and is safe for discharge home. Pertinent diagnoses were discussed with the patient. Patient was given return precautions.   Final Clinical Impression(s) / ED Diagnoses Final diagnoses:  Food impaction of esophagus, initial encounter    Rx / DC Orders ED Discharge Orders         Ordered    omeprazole (PRILOSEC) 20 MG capsule  Daily     Discontinue  Reprint     02/08/20 0019           Merryl Hacker, MD 02/08/20 [phone]

## 2020-02-08 NOTE — Discharge Instructions (Addendum)
You were seen today for a food impaction.  Follow-up with Gastroenterology as soon as possible for endoscopy.  Start omeprazole daily.

## 2020-02-26 ENCOUNTER — Telehealth: Payer: Self-pay | Admitting: Family Medicine

## 2020-02-26 NOTE — Telephone Encounter (Signed)
Patient states she is traveling to [place] in about 6 weeks.... she was told by Charlett Blake  she will need some Mylaria before she goes. Please inform patient when meds are sent in. Thanks

## 2020-02-28 ENCOUNTER — Other Ambulatory Visit: Payer: Self-pay | Admitting: Family Medicine

## 2020-02-28 NOTE — Telephone Encounter (Signed)
She needs to start Chloroquine 500 mg tabs, 1 tab weekly starting one week prior to trip and continuing for 4 weeks after so confirm how many weeks she will be gone.

## 2020-02-29 MED ORDER — CHLOROQUINE PHOSPHATE 500 MG PO TABS: 500.0000 mg | ORAL_TABLET | Freq: Every day | ORAL | 0 refills | Status: DC

## 2020-02-29 NOTE — Telephone Encounter (Signed)
Patient states that she checked the CDC website and the med that was sent in for her today is showing as a med that is resistant. And she thinks she has the wrong med.  However the CDC did recommend 3 others to take. Please call back advise. Thanks

## 2020-02-29 NOTE — Telephone Encounter (Signed)
We sent in medication.  I looked on CDC for [place] and this is what it says.   "[place] [facility] 2-25. Malaria transmission areas in [place]  Yellow Fever Requirements: Required if traveling from a country with risk of YF virus transmission and ?56 year of age, including transit >12 hours in an airport located in a country with risk of YF virus transmission.1  Recommendations: None  Malaria Areas with malaria: Present along the border with [place] and [place]. Specifically in [place], [place], and [place]; [employer]	[place] in [place]; and [place] in [place]. Present in [facility] (see Map 2-25).  Estimated relative risk of malaria for [place] travelers:  Drug resistance4: Chloroquine.  Malaria species: P. falciparum 90%, P. vivax 5%, P. ovale 5%.  Recommended chemoprophylaxis: Areas in [place], [place], and [place] with malaria: Atovaquone-proguanil, doxycycline, mefloquine, or tafenoquine.4

## 2020-02-29 NOTE — Telephone Encounter (Signed)
Pt. Has been notified for Rx , and understands to take 1 tab weekly starting one week prior to trip and continuing for 4 weeks after, as instructed per provider.  Shawna Johnson

## 2020-03-01 ENCOUNTER — Encounter: Payer: Self-pay | Admitting: Family Medicine

## 2020-03-01 NOTE — Telephone Encounter (Signed)
Switch her to Mefloquine 250 mg tabs, 1 tab po weekly starting 2 weeks prior to travel and ending four weeks after return. For immunizations for travel which is how yellow fever is dealt with she will have to got to a travel clinic, we do not have access to the shot. The [facility] might have it and [facility] has a travel clinic. TY

## 2020-03-02 ENCOUNTER — Other Ambulatory Visit: Payer: Self-pay | Admitting: Family Medicine

## 2020-03-03 ENCOUNTER — Other Ambulatory Visit: Payer: Self-pay | Admitting: Family Medicine

## 2020-03-03 MED ORDER — ATOVAQUONE-PROGUANIL HCL 250-100 MG PO TABS: 1.0000 | ORAL_TABLET | Freq: Every day | ORAL | 0 refills | Status: DC

## 2020-03-03 NOTE — Telephone Encounter (Signed)
Spoke with patient regarding trip/medications.  Patient is leaving for [place] on October 5th, returning on October 19th (15 day trip).  She prefers Atovaquone-Proguanil (malarone) rather than Mefloquine d/t side effects.  She also states she will not need medication for yellow fever.   Okay to order Atovaquone-Proguanil?

## 2020-04-10 ENCOUNTER — Other Ambulatory Visit: Payer: Self-pay | Admitting: Family Medicine

## 2020-04-25 ENCOUNTER — Ambulatory Visit: Payer: Managed Care, Other (non HMO) | Admitting: Internal Medicine

## 2020-04-25 ENCOUNTER — Encounter: Payer: Self-pay | Admitting: Internal Medicine

## 2020-04-25 VITALS — BP 108/68 | HR 87 | Ht 62.5 in | Wt 151.0 lb

## 2020-04-25 DIAGNOSIS — R1319 Other dysphagia: Secondary | ICD-10-CM | POA: Diagnosis not present

## 2020-04-25 NOTE — Progress Notes (Signed)
HPI: Shawna Johnson is a 54 year old female with a past medical history of hyperlipidemia and arthritis who is seen in consult at the request of Dr. Charlett Blake to evaluate solid food dysphagia.  She is here alone today.  She is known to me from screening colonoscopy which I performed in January 2016.  This was normal with the exception of a sessile polyp removed by cold snare from the hepatic flexure.  This was benign lymphoid aggregate without adenomatous change nor evidence of sessile serrated polyp.  She reports that over the last few years she has had intermittent solid food dysphagia.  This is been more noticeable over the last few months with episodes occurring slightly more frequently and lasting slightly longer.  This is worse with meats and occasionally foods like pasta.  Recently on 02/07/2020 after eating a pork chop she had food lodged in the esophagus for more than 4 hours and she went to the emergency room at [facility].  By the time that she saw physician the food had passed spontaneously.  She reports when symptoms started happening years ago she could force the food bolus down with liquids but more recently she has had to bring the impacted bolus back up and out by vomiting.  Since this episode in August she has been cutting food into "tiny pieces" and chewing extremely well.  She does not have heartburn.  No odynophagia.  No nausea or vomiting.  No abdominal pain.  No change in bowel habits.  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 24 tablet 0   calcium carbonate (OS-CAL) 1250 (500 Ca) MG chewable tablet Chew 2 tablets by mouth daily.     vitamin C (ASCORBIC ACID) 250 MG tablet Take 250 mg by mouth daily.     calcium carbonate (OSCAL) 1500 (600 Ca) MG TABS tablet Take by mouth 2 (two) times daily with a meal.     omeprazole (PRILOSEC) 20 MG capsule Take 1 capsule (20 mg total) by mouth daily. 30 capsule 0   No facility-administered medications prior to visit.    Allergies  Allergen Reactions   Sulfa Antibiotics Swelling    Family History  Problem Relation Age of Onset   Heart attack Father    Hyperlipidemia Father    Hypertension Father    Sudden death Father    Heart disease Father        MI   Alcohol abuse Father    Congestive Heart Failure Sister    COPD Sister    Heart disease Sister        has pacer/defib in place    Cardiomyopathy Sister    Drug abuse Sister        crack in past   Mental illness Maternal Grandfather        suicide   Heart disease Paternal Grandmother  Heart disease Paternal Grandfather    Breast cancer Maternal Aunt    Melanoma Maternal Aunt    Colon cancer Neg Hx    Esophageal cancer Neg Hx    Stomach cancer Neg Hx     Social History   Tobacco Use   Smoking status: Former Smoker    Types: E-cigarettes   Smokeless tobacco: Former Systems developer    Quit date: 04/24/2016  Vaping Use   Vaping Use: Never used  Substance Use Topics   Alcohol use: Yes    Alcohol/week: 0.0 standard drinks    Comment: Rare   Drug use: No    ROS: As per history of present illness, otherwise negative  BP 108/68    Pulse 87    Ht 5' 2.5" (1.588 m)    Wt 151 lb (68.5 kg)    SpO2 97%    BMI 27.18 kg/m  Constitutional: Well-developed and well-nourished. No distress. HEENT: Normocephalic and atraumatic.  Marland Kitchen Conjunctivae are normal.  No scleral  icterus. Neck: Neck supple. Trachea midline. Cardiovascular: Normal rate, regular rhythm and intact distal pulses. No M/R/G Pulmonary/chest: Effort normal and breath sounds normal. No wheezing, rales or rhonchi. Abdominal: Soft, nontender, nondistended. Bowel sounds active throughout. There are no masses palpable. No hepatosplenomegaly. Extremities: no clubbing, cyanosis, or edema Neurological: Alert and oriented to person place and time. Skin: Skin is warm and dry.  Psychiatric: Normal mood and affect. Behavior is normal.  RELEVANT LABS AND IMAGING: CBC    Component Value Date/Time   WBC 7.3 12/10/2019 1128   RBC 4.59 12/10/2019 1128   HGB 13.7 12/10/2019 1128   HCT 41.8 12/10/2019 1128   PLT 349.0 12/10/2019 1128   MCV 91.0 12/10/2019 1128   MCH 29.8 04/26/2014 1204   MCHC 32.8 12/10/2019 1128   RDW 13.5 12/10/2019 1128   LYMPHSABS 3.2 04/26/2014 1204   MONOABS 0.7 04/26/2014 1204   EOSABS 0.2 04/26/2014 1204   BASOSABS 0.1 04/26/2014 1204    CMP     Component Value Date/Time   NA 139 12/10/2019 1128   K 4.1 12/10/2019 1128   CL 102 12/10/2019 1128   CO2 30 12/10/2019 1128   GLUCOSE 92 12/10/2019 1128   BUN 14 12/10/2019 1128   CREATININE 0.76 12/10/2019 1128   CREATININE 0.77 06/08/2014 1146   CALCIUM 9.5 12/10/2019 1128   PROT 6.7 12/10/2019 1128   ALBUMIN 4.5 12/10/2019 1128   AST 15 12/10/2019 1128   ALT 15 12/10/2019 1128   ALKPHOS 117 12/10/2019 1128   BILITOT 0.4 12/10/2019 1128   GFRNONAA 73 04/26/2014 1204   GFRAA 84 04/26/2014 1204    ASSESSMENT/PLAN: 54 year old female with a past medical history of hyperlipidemia and arthritis who is seen in consult at the request of Dr. Charlett Blake to evaluate solid food dysphagia.   1.  Solid food dysphagia --without heartburn or other abdominal complaint.  We discussed my recommendation for upper endoscopy.  Rule out esophagitis, stricture, EoE, or much less likely an obstructing neoplasm.  We also discussed possibly  dilating the esophagus at upper endoscopy.  After discussing the risk, benefits and alternatives she is agreeable wishes to proceed.  Without heartburn or other GERD symptoms I will not start medical therapy at this point.  Finally we also discussed esophageal motility and if upper endoscopy is normal we would likely proceed with esophageal manometry. --Upper endoscopy with possible dilation in the [place]  2.  CRC screening --colonoscopy without adenomatous polyps in January 2016, repeat would be  recommended January 2026   PR:[facility], Bonnita Levan, Md [address] [place],  [place] [id number]

## 2020-04-25 NOTE — Patient Instructions (Signed)
You have been scheduled for an endoscopy. Please follow written instructions given to you at your visit today. If you use inhalers (even only as needed), please bring them with you on the day of your procedure.  If you are age 56 or older, your body mass index should be between 23-30. Your Body mass index is 27.18 kg/m. If this is out of the aforementioned range listed, please consider follow up with your Primary Care Provider.  If you are age 40 or younger, your body mass index should be between 19-25. Your Body mass index is 27.18 kg/m. If this is out of the aformentioned range listed, please consider follow up with your Primary Care Provider.   Due to recent changes in healthcare laws, you may see the results of your imaging and laboratory studies on MyChart before your provider has had a chance to review them.  We understand that in some cases there may be results that are confusing or concerning to you. Not all laboratory results come back in the same time frame and the provider may be waiting for multiple results in order to interpret others.  Please give [place] 48 hours in order for your provider to thoroughly review all the results before contacting the office for clarification of your results.

## 2020-05-16 ENCOUNTER — Encounter: Payer: Managed Care, Other (non HMO) | Admitting: Internal Medicine

## 2020-06-09 ENCOUNTER — Other Ambulatory Visit: Payer: Self-pay

## 2020-06-09 ENCOUNTER — Encounter: Payer: Self-pay | Admitting: Internal Medicine

## 2020-06-09 ENCOUNTER — Ambulatory Visit (AMBULATORY_SURGERY_CENTER): Payer: Managed Care, Other (non HMO) | Admitting: Internal Medicine

## 2020-06-09 VITALS — BP 108/81 | HR 64 | Temp 97.3°F | Resp 17 | Ht 62.0 in | Wt 151.0 lb

## 2020-06-09 DIAGNOSIS — K222 Esophageal obstruction: Secondary | ICD-10-CM

## 2020-06-09 DIAGNOSIS — K269 Duodenal ulcer, unspecified as acute or chronic, without hemorrhage or perforation: Secondary | ICD-10-CM | POA: Diagnosis not present

## 2020-06-09 DIAGNOSIS — R1319 Other dysphagia: Secondary | ICD-10-CM | POA: Diagnosis not present

## 2020-06-09 DIAGNOSIS — K259 Gastric ulcer, unspecified as acute or chronic, without hemorrhage or perforation: Secondary | ICD-10-CM | POA: Diagnosis not present

## 2020-06-09 MED ORDER — PANTOPRAZOLE SODIUM 40 MG PO TBEC: 40.0000 mg | DELAYED_RELEASE_TABLET | Freq: Two times a day (BID) | ORAL | 3 refills | Status: DC

## 2020-06-09 MED ORDER — SODIUM CHLORIDE 0.9 % IV SOLN: 500.0000 mL | INTRAVENOUS | Status: DC

## 2020-06-09 NOTE — Progress Notes (Signed)
5868 Robinul 0.1 mg IV given due large amount of secretions upon assessment.  MD made aware, vss

## 2020-06-09 NOTE — Op Note (Signed)
[facility] Patient Name: [name] Procedure Date: 06/09/2020 9:28 AM MRN: [id number] Endoscopist: Jerene Bears , MD Age: 54 Referring MD:  Date of Birth: [birth year] Gender: Female Account #: [id number] Procedure:                Upper GI endoscopy Indications:              Dysphagia Medicines:                Monitored Anesthesia Care Procedure:                Pre-Anesthesia Assessment:                           - Prior to the procedure, a History and Physical                            was performed, and patient medications and                            allergies were reviewed. The patient's tolerance of                            previous anesthesia was also reviewed. The risks                            and benefits of the procedure and the sedation                            options and risks were discussed with the patient.                            All questions were answered, and informed consent                            was obtained. Prior Anticoagulants: The patient has                            taken no previous anticoagulant or antiplatelet                            agents. ASA Grade Assessment: II - A patient with                            mild systemic disease. After reviewing the risks                            and benefits, the patient was deemed in                            satisfactory condition to undergo the procedure.                           After obtaining informed consent, the endoscope was  passed under direct vision. Throughout the                            procedure, the patient's blood pressure, pulse, and                            oxygen saturations were monitored continuously. The                            Endoscope was introduced through the mouth, and                            advanced to the second part of duodenum. The upper                            GI endoscopy was accomplished without  difficulty.                            The patient tolerated the procedure well. Scope In: Scope Out: Findings:                 Mucosal changes including longitudinal furrows were                            found in the middle third of the esophagus and in                            the lower third of the esophagus. Biopsies were                            obtained from the proximal and distal esophagus                            with cold forceps for histology of suspected                            eosinophilic esophagitis. This could be GERD                            related without EoE, but await biopsies. Patient                            not on PPI currently.                           One benign-appearing, intrinsic moderate                            (circumferential scarring or stenosis; an endoscope                            may pass) stenosis was found 38 cm from the  incisors. This stenosis measured 1.1 cm (inner                            diameter) x less than one cm (in length). A TTS                            dilator was passed through the scope. Dilation with                            a 16-17-18 mm balloon dilator was performed to 16                            mm. The dilation site was examined and showed                            moderate mucosal disruption and moderate                            improvement in luminal narrowing.                           One non-bleeding cratered gastric ulcer with no                            bleeding was found in the gastric antrum. The                            lesion was 4 mm in largest dimension. Biopsies were                            taken with a cold forceps for histology and                            Helicobacter pylori testing (around ulcer, antrum,                            body and incisura).                           Five non-bleeding superficial duodenal ulcers with                             no stigmata of bleeding were found in the duodenal                            bulb. The largest lesion was 6 mm in largest                            dimension.                           The second portion of the duodenum was normal. Complications:            No immediate complications. Estimated Blood Loss:  Estimated blood loss was minimal. Impression:               - Esophageal mucosal changes suspicious for                            eosinophilic esophagitis. Biopsied.                           - Benign-appearing esophageal stenosis. Dilated to                            16 mm with balloon.                           - Non-bleeding gastric ulcer with no stigmata of                            bleeding. Biopsied.                           - Non-bleeding duodenal ulcers with no stigmata of                            bleeding.                           - Normal second portion of the duodenum. Recommendation:           - Patient has a contact number available for                            emergencies. The signs and symptoms of potential                            delayed complications were discussed with the                            patient. Return to normal activities tomorrow.                            Written discharge instructions were provided to the                            patient.                           - Resume previous diet.                           - Continue present medications.                           - Begin pantoprazole 40 mg twice daily x 1 month,                            then once daily x 2 months.                           -  No aspirin, ibuprofen, naproxen, or other                            non-steroidal anti-inflammatory drugs for 8 weeks.                           - Await pathology results.                           - Repeat upper endoscopy PRN for retreatment.                           - Office followup with me in Feb 2022. Jerene Bears,  MD 06/09/2020 9:55:55 AM This report has been signed electronically.

## 2020-06-09 NOTE — Progress Notes (Signed)
Reviewed Medical/surgical noted any changes since office visit.

## 2020-06-09 NOTE — Progress Notes (Signed)
Report given to PACU, vss 

## 2020-06-09 NOTE — Progress Notes (Signed)
Called to room to assist during endoscopic procedure.  Patient ID and intended procedure confirmed with present staff. Received instructions for my participation in the procedure from the performing physician.  

## 2020-06-09 NOTE — Patient Instructions (Signed)
Please read handouts provided. Continue present medications.  Begin pantopazole 40 mg twice daily for 1 month, then once daily for 2 months. No aspirin, ibuprofen, naproxen, or other non-steriodal anti-inflammatory drugs for 8 weeks. Await pathology results. Resume previous diet. Repeat upper endoscopy as needed for retreatment. Schedule office follow-up with Dr. Hilarie Fredrickson in Feb. 2022.     YOU HAD AN ENDOSCOPIC PROCEDURE TODAY AT [facility]:   Refer to the procedure report that was given to you for any specific questions about what was found during the examination.  If the procedure report does not answer your questions, please call your gastroenterologist to clarify.  If you requested that your care partner not be given the details of your procedure findings, then the procedure report has been included in a sealed envelope for you to review at your convenience later.  YOU SHOULD EXPECT: Some feelings of bloating in the abdomen. Passage of more gas than usual.  Walking can help get rid of the air that was put into your GI tract during the procedure and reduce the bloating. If you had a lower endoscopy (such as a colonoscopy or flexible sigmoidoscopy) you may notice spotting of blood in your stool or on the toilet paper. If you underwent a bowel prep for your procedure, you may not have a normal bowel movement for a few days.  Please Note:  You might notice some irritation and congestion in your nose or some drainage.  This is from the oxygen used during your procedure.  There is no need for concern and it should clear up in a day or so.  SYMPTOMS TO REPORT IMMEDIATELY:    Following upper endoscopy (EGD)  Vomiting of blood or coffee ground material  New chest pain or pain under the shoulder blades  Painful or persistently difficult swallowing  New shortness of breath  Fever of 100F or higher  Black, tarry-looking stools  For urgent or emergent issues, a  gastroenterologist can be reached at any hour by calling [phone]. Do not use MyChart messaging for urgent concerns.    DIET:  We do recommend a small meal at first, but then you may proceed to your regular diet.  Drink plenty of fluids but you should avoid alcoholic beverages for 24 hours.  ACTIVITY:  You should plan to take it easy for the rest of today and you should NOT DRIVE or use heavy machinery until tomorrow (because of the sedation medicines used during the test).    FOLLOW UP: Our staff will call the number listed on your records 48-72 hours following your procedure to check on you and address any questions or concerns that you may have regarding the information given to you following your procedure. If we do not reach you, we will leave a message.  We will attempt to reach you two times.  During this call, we will ask if you have developed any symptoms of COVID 19. If you develop any symptoms (ie: fever, flu-like symptoms, shortness of breath, cough etc.) before then, please call [phone].  If you test positive for Covid 19 in the 2 weeks post procedure, please call and report this information to [place].    If any biopsies were taken you will be contacted by phone or by letter within the next 1-3 weeks.  Please call us at [phone] if you have not heard about the biopsies in 3 weeks.    SIGNATURES/CONFIDENTIALITY: You and/or your care partner have signed paperwork  which will be entered into your electronic medical record.  These signatures attest to the fact that that the information above on your After Visit Summary has been reviewed and is understood.  Full responsibility of the confidentiality of this discharge information lies with you and/or your care-partner.

## 2020-06-13 ENCOUNTER — Telehealth: Payer: Self-pay | Admitting: *Deleted

## 2020-06-13 NOTE — Telephone Encounter (Signed)
1. Have you developed a fever since your procedure? no  2.   Have you had an respiratory symptoms (SOB or cough) since your procedure? no  3.   Have you tested positive for COVID 19 since your procedure no  4.   Have you had any family members/close contacts diagnosed with the COVID 19 since your procedure?  no   If yes to any of these questions please route to Joylene John, RN and Joella Prince, RN Follow up Call-  Call back number 06/09/2020  Post procedure Call Back phone  # [phone]  Permission to leave phone message Yes  Some recent data might be hidden     Patient questions:  Do you have a fever, pain , or abdominal swelling? No. Pain Score  0 *  Have you tolerated food without any problems? Yes.    Have you been able to return to your normal activities? Yes.    Do you have any questions about your discharge instructions: Diet   No. Medications  No. Follow up visit  No.  Do you have questions or concerns about your Care? No.  Actions: * If pain score is 4 or above: No action needed, pain <4.

## 2020-06-14 ENCOUNTER — Other Ambulatory Visit: Payer: Self-pay | Admitting: Family Medicine

## 2020-06-14 ENCOUNTER — Encounter: Payer: Self-pay | Admitting: Internal Medicine

## 2020-06-14 DIAGNOSIS — Z1231 Encounter for screening mammogram for malignant neoplasm of breast: Secondary | ICD-10-CM

## 2020-06-16 ENCOUNTER — Other Ambulatory Visit: Payer: Self-pay

## 2020-06-16 ENCOUNTER — Ambulatory Visit (INDEPENDENT_AMBULATORY_CARE_PROVIDER_SITE_OTHER): Payer: Managed Care, Other (non HMO)

## 2020-06-16 DIAGNOSIS — Z1231 Encounter for screening mammogram for malignant neoplasm of breast: Secondary | ICD-10-CM

## 2020-07-12 ENCOUNTER — Encounter: Payer: Self-pay | Admitting: Family Medicine

## 2020-07-12 ENCOUNTER — Other Ambulatory Visit: Payer: Self-pay

## 2020-07-12 ENCOUNTER — Ambulatory Visit (INDEPENDENT_AMBULATORY_CARE_PROVIDER_SITE_OTHER): Payer: Managed Care, Other (non HMO) | Admitting: Family Medicine

## 2020-07-12 DIAGNOSIS — M7582 Other shoulder lesions, left shoulder: Secondary | ICD-10-CM | POA: Diagnosis not present

## 2020-07-12 DIAGNOSIS — M1990 Unspecified osteoarthritis, unspecified site: Secondary | ICD-10-CM | POA: Diagnosis not present

## 2020-07-12 DIAGNOSIS — K279 Peptic ulcer, site unspecified, unspecified as acute or chronic, without hemorrhage or perforation: Secondary | ICD-10-CM | POA: Insufficient documentation

## 2020-07-12 DIAGNOSIS — E782 Mixed hyperlipidemia: Secondary | ICD-10-CM | POA: Diagnosis not present

## 2020-07-12 NOTE — Assessment & Plan Note (Signed)
Tolerating atorvastatin well.   Lab Results  Component Value Date   LDLCALC 113 (H) 12/10/2019  Continue current medication.

## 2020-07-12 NOTE — Progress Notes (Signed)
Shawna Johnson - 55 y.o. female MRN [id number]  Date of birth: [birth year]  Subjective Chief Complaint  Patient presents with  . Establish Care    HPI Shawna Johnson is a 55 y.o. female here today for initial visit to establish care.  She has been in fairly good health with history of HLD that is treated with atorvastatin.  She is tolerating this well.  She did also have recent EGD for esophageal stricture and was found to have peptic ulcer.  H. Pylori biopsies were negative.  She is currently on pantoprazole.  She was advised to avoid NSAIDS.  This is somewhat problematic because she has arthritis of several joints including knees, hips and wrists.  She enjoys Radiographer, therapeutic which require quite a bit of walking in different terrains.  She has upcoming trip to [place] and is concerned about amount of walking.    She also has some L shoulder pain.  This is worse with internal rotation and overhead movement.  Denies injury.   ROS:  A comprehensive ROS was completed and negative except as noted per HPI  Allergies  Allergen Reactions  . Sulfa Antibiotics Swelling    Past Medical History:  Diagnosis Date  . Abnormal alkaline phosphatase test 04/24/2016  . Arthritis   . Elevated alkaline phosphatase level   . Fatty liver   . 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 Hyperlipidemia   . Preventative health care 10/24/2015    Past Surgical History:  Procedure Laterality Date  . CERVICAL CONIZATION W/BX  05/2006  . COSMETIC SURGERY     eyelid  . INNER EAR SURGERY     several childhood  . TONSILLECTOMY AND ADENOIDECTOMY      Social History   Socioeconomic History  . Marital status: Married    Spouse name: Not on file  . Number of children: 1  . Years of education: Not on file  . Highest education level: Not on file  Occupational History  . Occupation: Stylist  Tobacco Use  . Smoking status: Former Smoker    Packs/day: 0.50    Years: 30.00    Pack years: 15.00    Types: Cigarettes     Quit date: 07/02/2010    Years since quitting: 10.0  . Smokeless tobacco: Former Systems developer    Quit date: 04/24/2016  Vaping Use  . Vaping Use: Never used  Substance and Sexual Activity  . Alcohol use: Yes    Alcohol/week: 4.0 standard drinks    Types: 4 Glasses of wine per week    Comment: weekly  . Drug use: No  . 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 Not on file  Social History Narrative  . Not on file   Social Determinants of Health   Financial Resource Strain: Not on file  Food Insecurity: Not on file  Transportation Needs: Not on file  Physical Activity: Not on file  Stress: Not on file  Social Connections: Not on file    Family History  Problem Relation Age of Onset  . Heart attack Father   . Hyperlipidemia Father   . Hypertension Father   . Sudden death Father   . Heart disease Father        MI  . Alcohol abuse Father   . Congestive Heart Failure Sister   . COPD Sister   .  Heart disease Sister        has pacer/defib in place   . Cardiomyopathy Sister   . Drug abuse Sister        crack in past  . Mental illness Maternal Grandfather        suicide  . Heart disease Paternal Grandmother   . Heart disease Paternal Grandfather   . Breast cancer Maternal Aunt   . Melanoma Maternal Aunt   . Diabetes Maternal Aunt   . Colon cancer Neg Hx   . Esophageal cancer Neg Hx   . Stomach cancer Neg Hx   . Rectal cancer Neg Hx     Health Maintenance  Topic Date Due  . HIV Screening  Never done  . MAMMOGRAM  06/16/2022  . PAP SMEAR-Modifier  12/10/2022  . COLONOSCOPY (Pts 45-29yrs Insurance coverage will need to be confirmed)  07/20/2024  . TETANUS/TDAP  10/23/2025  . INFLUENZA VACCINE  Completed  . COVID-19 Vaccine  Completed  . Hepatitis C Screening  Completed      ----------------------------------------------------------------------------------------------------------------------------------------------------------------------------------------------------------------- Physical Exam BP 122/67 (BP Location: Left Arm, Patient Position: Sitting, Cuff Size: Normal)   Pulse 72   Ht 5' 1.81" (1.57 m)   Wt 149 lb 9.6 oz (67.9 kg)   LMP 07/02/2013   SpO2 100%   BMI 27.53 kg/m   Physical Exam Constitutional:      Appearance: Normal appearance.  Eyes:     General: No scleral icterus. Cardiovascular:     Rate and Rhythm: Normal rate and regular rhythm.  Musculoskeletal:     Comments: ROM of L shoulder is normal with some pain on abduction.  Mild pain on internal rotation without significant impingement signs.  Rotator cuff strength is good.  She does have a positive empty can sign.   Skin:    General: Skin is warm and dry.  Neurological:     General: No focal deficit present.     Mental Status: She is alert.  Psychiatric:        Mood and Affect: Mood normal.        Behavior: Behavior normal.     ------------------------------------------------------------------------------------------------------------------------------------------------------------------------------------------------------------------- Assessment and Plan  Arthritis She has arthritis of multiple areas. No recent imaging in our system. NSAID use limited due to recent ulcers.  Will have her try glucosamine/chondroitin supplement and she may try topical diclofenac as needed. She may use tylenol as well.  Knee sleeve may be helpful when walking.  If worsening will obtain imaging and consider having her see Dr. Dianah Field for possible injection.   Hyperlipidemia Tolerating atorvastatin well.   Lab Results  Component Value Date   LDLCALC 113 (H) 12/10/2019  Continue current medication.   Peptic ulcer Negative H. Pylori.  Limit NSAIDS and continue PPI.  She has f/u  with GI.    Rotator cuff tendinitis, left Given handout for home rehab exercises. Can consider injection if not improving.    No orders of the defined types were placed in this encounter.   No follow-ups on file.  45 minutes spent including pre visit preparation, review of prior notes and labs, encounter with patient  and same day documentation.   This visit occurred during the SARS-CoV-2 public health emergency.  Safety protocols were in place, including screening questions prior to the visit, additional usage of staff PPE, and extensive cleaning of exam room while observing appropriate contact time as indicated for disinfecting solutions.

## 2020-07-12 NOTE — Assessment & Plan Note (Signed)
Negative H. Pylori.  Limit NSAIDS and continue PPI.  She has f/u with GI.

## 2020-07-12 NOTE — Assessment & Plan Note (Signed)
Given handout for home rehab exercises. Can consider injection if not improving.

## 2020-07-12 NOTE — Assessment & Plan Note (Signed)
She has arthritis of multiple areas. No recent imaging in our system. NSAID use limited due to recent ulcers.  Will have her try glucosamine/chondroitin supplement and she may try topical diclofenac as needed. She may use tylenol as well.  Knee sleeve may be helpful when walking.  If worsening will obtain imaging and consider having her see Dr. Dianah Field for possible injection.

## 2020-07-12 NOTE — Patient Instructions (Signed)
Great to meet you today! Try shoulder rehab exercises.   Try topical diclofenac/Voltaren gel for knees/wrists/ankles.  Try glucosamine/chondroitin supplement.  You may use tylenol if needed.  Continue to stay active. Knee sleeve/brace may help if walking long distances.

## 2020-07-20 ENCOUNTER — Telehealth: Payer: Self-pay | Admitting: Family Medicine

## 2020-07-20 NOTE — Telephone Encounter (Signed)
FY I: Pt transferred from  [facility] office to Dr Zigmund Daniel. Her records can be viewed in [facility].

## 2020-07-21 ENCOUNTER — Ambulatory Visit (INDEPENDENT_AMBULATORY_CARE_PROVIDER_SITE_OTHER): Payer: Managed Care, Other (non HMO) | Admitting: Sports Medicine

## 2020-07-21 ENCOUNTER — Other Ambulatory Visit: Payer: Self-pay

## 2020-07-21 ENCOUNTER — Ambulatory Visit (INDEPENDENT_AMBULATORY_CARE_PROVIDER_SITE_OTHER): Payer: Managed Care, Other (non HMO)

## 2020-07-21 DIAGNOSIS — M25571 Pain in right ankle and joints of right foot: Secondary | ICD-10-CM

## 2020-07-21 DIAGNOSIS — M170 Bilateral primary osteoarthritis of knee: Secondary | ICD-10-CM

## 2020-07-21 DIAGNOSIS — G8929 Other chronic pain: Secondary | ICD-10-CM

## 2020-07-21 DIAGNOSIS — M7731 Calcaneal spur, right foot: Secondary | ICD-10-CM

## 2020-07-21 MED ORDER — ACETAMINOPHEN ER 650 MG PO TBCR: 650.0000 mg | EXTENDED_RELEASE_TABLET | Freq: Three times a day (TID) | ORAL | 3 refills | Status: DC | PRN

## 2020-07-21 NOTE — Progress Notes (Signed)
    Procedures performed today:    None.  Independent interpretation of notes and tests performed by another provider:   None.  Brief History, Exam, Impression, and Recommendations:    Primary osteoarthritis of both knees Bilateral anterior knee pain with gelling, consistent with osteoarthritis, mild. Exam is benign. Getting x-rays, adding arthritis strength Tylenol, unable to do NSAIDs due to history of gastric ulcer. Formal physical therapy. Return to see me in 2 to 3 weeks, we will do an injection if no better because she does have a trip coming up to [facility] in a month.  Chronic pain of right ankle Ginger also has chronic pain at Johnson medial and lateral talar domes and some behind the lateral malleolus, exam is benign. Mild gelling also to suggest osteoarthritis. Mild pes cavus. Physical therapy and arthritis from Tylenol as above, x-rays, referral to Dr. Raeford Razor for custom molded orthotics as she does have a trip coming up to [facility] in a month.    ___________________________________________ Shawna Johnson. Dianah Field, M.D., ABFM., CAQSM. Primary Care and [place] Instructor of [facility]

## 2020-07-21 NOTE — Assessment & Plan Note (Signed)
Ginger also has chronic pain at her medial and lateral talar domes and some behind the lateral malleolus, exam is benign. Mild gelling also to suggest osteoarthritis. Mild pes cavus. Physical therapy and arthritis from Tylenol as above, x-rays, referral to Dr. Raeford Razor for custom molded orthotics as she does have a trip coming up to [facility] in a month.

## 2020-07-21 NOTE — Assessment & Plan Note (Signed)
Bilateral anterior knee pain with gelling, consistent with osteoarthritis, mild. Exam is benign. Getting x-rays, adding arthritis strength Tylenol, unable to do NSAIDs due to history of gastric ulcer. Formal physical therapy. Return to see me in 2 to 3 weeks, we will do an injection if no better because she does have a trip coming up to [facility] in a month.

## 2020-07-22 ENCOUNTER — Ambulatory Visit: Payer: Managed Care, Other (non HMO) | Admitting: Family Medicine

## 2020-07-22 ENCOUNTER — Other Ambulatory Visit: Payer: Self-pay

## 2020-07-22 DIAGNOSIS — G8929 Other chronic pain: Secondary | ICD-10-CM | POA: Diagnosis not present

## 2020-07-22 DIAGNOSIS — M25571 Pain in right ankle and joints of right foot: Secondary | ICD-10-CM | POA: Diagnosis not present

## 2020-07-22 NOTE — Assessment & Plan Note (Signed)
Fitted and custom orthotics today. -Counseled on home exercise therapy and supportive care. -Orthotics.

## 2020-07-22 NOTE — Progress Notes (Signed)
Shawna Johnson - 55 y.o. female MRN [id number]  Date of birth: 10/17/1965  SUBJECTIVE:  Including CC & ROS.  No chief complaint on file.   Shawna Johnson is a 55 y.o. female that is presenting with right ankle pain.  Symptoms been ongoing for a while.  She walks a lot for her hobby.   Review of Systems See HPI   HISTORY: Past Medical, Surgical, Social, and Family History Reviewed & Updated per EMR.   Pertinent Historical Findings include:  Past Medical History:  Diagnosis Date  . Abnormal alkaline phosphatase test 04/24/2016  . Arthritis   . Elevated alkaline phosphatase level   . Fatty liver   . 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 Hyperlipidemia   . Preventative health care 10/24/2015    Past Surgical History:  Procedure Laterality Date  . CERVICAL CONIZATION W/BX  05/2006  . COSMETIC SURGERY     eyelid  . INNER EAR SURGERY     several childhood  . TONSILLECTOMY AND ADENOIDECTOMY      Family History  Problem Relation Age of Onset  . Heart attack Father   . Hyperlipidemia Father   . Hypertension Father   . Sudden death Father   . Heart disease Father        MI  . Alcohol abuse Father   . Congestive Heart Failure Sister   . COPD Sister   . Heart disease Sister        has pacer/defib in place   . Cardiomyopathy Sister   . Drug abuse Sister        crack in past  . Mental illness Maternal Grandfather        suicide  . Heart disease Paternal Grandmother   . Heart disease Paternal Grandfather   . Breast cancer Maternal Aunt   . Melanoma Maternal Aunt   . Diabetes Maternal Aunt   . Colon cancer Neg Hx   . Esophageal cancer Neg Hx   . Stomach cancer Neg Hx   . Rectal cancer Neg Hx     Social History   Socioeconomic History  . Marital status: Married    Spouse name: Not on file  . Number of children: 1  . Years of education: Not on file  . Highest education level: Not on file  Occupational History  . Occupation: Stylist  Tobacco Use  . Smoking status:  Former Smoker    Packs/day: 0.50    Years: 30.00    Pack years: 15.00    Types: Cigarettes    Quit date: 07/02/2010    Years since quitting: 10.0  . Smokeless tobacco: Former Systems developer    Quit date: 04/24/2016  Vaping Use  . Vaping Use: Never used  Substance and Sexual Activity  . Alcohol use: Yes    Alcohol/week: 4.0 standard drinks    Types: 4 Glasses of wine per week    Comment: weekly  . Drug use: No  . 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 Not on file  Social History Narrative  . Not on file   Social Determinants of Health   Financial Resource Strain: Not on file  Food Insecurity: Not on file  Transportation Needs: Not on file  Physical Activity: Not on file  Stress: Not on file  Social Connections: Not on file  Intimate  Partner Violence: Not on file     PHYSICAL EXAM:  VS: BP 120/70   Ht 5\' 2"  (1.575 m)   Wt 148 lb (67.1 kg)   LMP 07/02/2013   BMI 27.07 kg/m  Physical Exam Gen: NAD, alert, cooperative with exam, well-appearing MSK:  Right and left foot:  Widening between 1st and 2nd MT bilaterally  No swelling or ecchymosis.  Neurovascularly intact   Patient was fitted for a standard, cushioned, semi-rigid orthotic. The orthotic was heated and afterward the patient stood on the orthotic blank positioned on the orthotic stand. The patient was positioned in subtalar neutral position and 10 degrees of ankle dorsiflexion in a weight bearing stance. After completion of molding, a stable base was applied to the orthotic blank. The blank was ground to a stable position for weight bearing. Size: 62F Pairs: 2 Base: Blue EVA Additional Posting and Padding: None The patient ambulated these, and they were very comfortable.    ASSESSMENT & PLAN:   Chronic pain of right ankle Fitted and  custom orthotics today. -Counseled on home exercise therapy and supportive care. -Orthotics.

## 2020-08-03 ENCOUNTER — Ambulatory Visit: Payer: Managed Care, Other (non HMO) | Attending: Sports Medicine | Admitting: Physical Therapy

## 2020-08-03 ENCOUNTER — Other Ambulatory Visit: Payer: Self-pay

## 2020-08-03 DIAGNOSIS — M25561 Pain in right knee: Secondary | ICD-10-CM | POA: Insufficient documentation

## 2020-08-03 DIAGNOSIS — M25562 Pain in left knee: Secondary | ICD-10-CM | POA: Diagnosis present

## 2020-08-03 DIAGNOSIS — M25571 Pain in right ankle and joints of right foot: Secondary | ICD-10-CM | POA: Diagnosis present

## 2020-08-03 DIAGNOSIS — R262 Difficulty in walking, not elsewhere classified: Secondary | ICD-10-CM | POA: Diagnosis present

## 2020-08-03 NOTE — Therapy (Signed)
[facility] [address]  [place], [place], [address] Phone: [phone]   Fax:  [phone]  Physical Therapy Evaluation  Patient Details  Name: Shawna Johnson MRN: [id number] Date of Birth: 1965-08-03 Referring Provider (PT): Dr. Darene Lamer   Encounter Date: 08/03/2020   PT End of Session - 08/03/20 1346    Visit Number 1    Date for PT Re-Evaluation 10/01/20    Authorization Type [employer]    PT Start Time 1302    PT Stop Time 1348    PT Time Calculation (min) 46 min    Activity Tolerance Patient tolerated treatment well    Behavior During Therapy [facility] for tasks assessed/performed           Past Medical History:  Diagnosis Date  . Abnormal alkaline phosphatase test 04/24/2016  . Arthritis   . Elevated alkaline phosphatase level   . Fatty liver   . 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 Hyperlipidemia   . Preventative health care 10/24/2015    Past Surgical History:  Procedure Laterality Date  . CERVICAL CONIZATION W/BX  05/2006  . COSMETIC SURGERY     eyelid  . INNER EAR SURGERY     several childhood  . TONSILLECTOMY AND ADENOIDECTOMY      There were no vitals filed for this visit.    Subjective Assessment - 08/03/20 1307    Subjective Patient reports that she has had right ankle pain for 15 years, reports that in the past 1-2 years she has been having more ankle pain and has difficulty with going to the gym, x-rays negative. The MD gave orthotics the past week and she feels better.  She also has knee pain with arthritis and patellofemoral changes    Limitations Walking;Lifting    Patient Stated Goals be stronger, be able to go to the gym    Currently in Pain? No/denies    Pain Score 0-No pain    Pain Location Ankle   knees   Pain Orientation Right;Left   right ankle, both knees   Pain Descriptors / Indicators Sharp;Aching    Pain Type Acute pain    Pain Onset More than a month ago    Pain Frequency Intermittent     Aggravating Factors  after the gym, going up and down stairs pain up to 6/10 in the knees,  pain up to 7/10 in the ankle after gym and at end of the day    Pain Relieving Factors rest, inserts, pain can be 0/10    Effect of Pain on Daily Activities can't exercise like I want, can't squat              [facility] PT Assessment - 08/03/20 0001      Assessment   Medical Diagnosis right ankle  pain, bilateral knee pain    Referring Provider (PT) Dr. Darene Lamer    Onset Date/Surgical Date 07/20/20    Prior Therapy no      Precautions   Precautions None      Balance Screen   Has the patient fallen in the past 6 months No    Has the patient had a decrease in activity level because of a fear of falling?  No    Is the patient reluctant to leave their home because of a fear of falling?  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 of hiking, 3-4 x/week at [employer] all in classes has stopped the past few weeks      ROM / Strength   AROM / PROM / Strength AROM;Strength      AROM   AROM Assessment Site Knee;Ankle    Right/Left Knee Right;Left    Right Knee Extension 0    Right Knee Flexion 110    Left Knee Extension 0    Left Knee Flexion 110    Right/Left Ankle Right    Right Ankle Dorsiflexion 10    Right Ankle Plantar Flexion 45    Right Ankle Inversion 35    Right Ankle Eversion 12      Strength   Overall Strength Comments 4+/5 knees some crepitus in the knees with MMT, ankle strength 4+/5      Flexibility   Soft Tissue Assessment /Muscle Length yes    Hamstrings mild tightness    Quadriceps very tight    ITB mild tight    Piriformis mild tight      Palpation   Palpation comment crepitus in both knees significant crepitus int he right knee with MMT, non tender knees and ankle      Ambulation/Gait    Gait Comments no device, good form, no appreciable limp                      Objective measurements completed on examination: See above findings.                 PT Short Term Goals - 08/03/20 1351      PT SHORT TERM GOAL #1   Title independent with initial HEP    Time 2    Period Weeks    Status New             PT Long Term Goals - 08/03/20 1352      PT LONG TERM GOAL #1   Title independent with advanced HEP    Time 8    Period Weeks    Status New      PT LONG TERM GOAL #2   Title return to the gym 1-2 x / week without increased pain > 4/10    Time 8    Period Weeks    Status New      PT LONG TERM GOAL #3   Title increase right ankle DF to 15 degrees    Time 8    Period Weeks    Status New      PT LONG TERM GOAL #4   Title descend stairs without pain > 4/10    Time 8    Period Weeks    Status New                  Plan - 08/03/20 1347    Clinical Impression Statement Patient c/o bilateral knee pain (x-rays show degenerative changes patellafemoral and arhtitic changes in teh joint surface bilaterally), she reports right ankle pain (x-rays negative) she got orthotics last week and feel it is helping, her biggest issue is going to the gym, this significantly increases ankle pain, squatting and descending stairs increases the knee pain.  She is tight in the calves, HS, quads and piriformis mms.  Has limited DF, has some crepitus with AROM and significant crepitus with MMT.  Stability/Clinical Decision Making Evolving/Moderate complexity    Clinical Decision Making Low    Rehab Potential Good    PT Frequency 1x / week    PT Duration 8 weeks    PT Treatment/Interventions ADLs/Self Care Home Management;Cryotherapy;Electrical Stimulation;Moist Heat;Ultrasound;Gait training;Neuromuscular re-education;Balance training;Therapeutic exercise;Therapeutic activities;Iontophoresis 4mg /ml Dexamethasone;Functional mobility training;Stair  training;Patient/family education;Manual techniques    PT Next Visit Plan she will see MD tomorrow and then leaves for a trip to [facility] at the end of next week, prior to the trip I would like to assure HEP is okay and do a little bit more stretching and some ankle strength prior to her trip           Patient will benefit from skilled therapeutic intervention in order to improve the following deficits and impairments:  Abnormal gait,Decreased range of motion,Difficulty walking,Pain,Impaired flexibility,Decreased strength,Decreased mobility  Visit Diagnosis: Acute pain of right knee - Plan: PT plan of care cert/re-cert  Acute pain of left knee - Plan: PT plan of care cert/re-cert  Pain in right ankle and joints of right foot - Plan: PT plan of care cert/re-cert  Difficulty in walking, not elsewhere classified - Plan: PT plan of care cert/re-cert     Problem List Patient Active Problem List   Diagnosis Date Noted  . Primary osteoarthritis of both knees 07/21/2020  . Chronic pain of right ankle 07/21/2020  . Peptic ulcer 07/12/2020  . Rotator cuff tendinitis, left 07/12/2020  . Cervical cancer screening 12/14/2019  . Palpitations 12/09/2018  . Mixed conductive and sensorineural hearing loss of right ear with restricted hearing of left ear 01/30/2018  . Perforation of right tympanic membrane 01/30/2018  . 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 Abnormal alkaline phosphatase test 04/24/2016  . Preventative health care 10/24/2015  . Arthritis   . Hepatic steatosis 08/23/2014  . Multiple thyroid nodules  see U/S 2015 mm sized in left lobe 05/16/2014  . Breast calcifications on mammogram  R breast stable for 18 months [facility] 01/07/2014  . Breast nodule  decreasing size mm 09/2013 [facility] 01/07/2014  . 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 Abnormal Pap smear of cervix  S/P Leep normal pap since 2007 12/22/2013  . Hyperlipidemia 12/22/2013  . Left shoulder pain  10/08/2013  . Left hip pain 10/08/2013  . Right hip pain 10/08/2013    Sumner Boast., PT 08/03/2020, 1:55 PM  [facility] [address]  [place], [place], [address] Phone: [phone]   Fax:  [phone]  Name: Robbyn Nylin MRN: [id number] Date of Birth: 11-25-1965

## 2020-08-03 NOTE — Patient Instructions (Signed)
Access Code: [email or web address] URL: [email or web address] Date: 08/03/2020 Prepared by: Lum Babe  Exercises Prone Quadriceps Stretch with Strap - 2 x daily - 7 x weekly - 1 sets - 5 reps - 30 hold Supine Quadriceps Stretch with Strap on Table - 2 x daily - 7 x weekly - 1 sets - 5 reps - 30 hold Supine Piriformis Stretch Pulling Heel to Hip - 2 x daily - 7 x weekly - 1 sets - 5 reps - 30 hold Standing Bilateral Gastroc Stretch with Step - 2 x daily - 7 x weekly - 3 sets - 5 reps - 30 hold

## 2020-08-04 ENCOUNTER — Ambulatory Visit: Payer: Managed Care, Other (non HMO) | Admitting: Sports Medicine

## 2020-08-04 ENCOUNTER — Ambulatory Visit (INDEPENDENT_AMBULATORY_CARE_PROVIDER_SITE_OTHER): Payer: Managed Care, Other (non HMO)

## 2020-08-04 DIAGNOSIS — M25571 Pain in right ankle and joints of right foot: Secondary | ICD-10-CM | POA: Diagnosis not present

## 2020-08-04 DIAGNOSIS — G8929 Other chronic pain: Secondary | ICD-10-CM

## 2020-08-04 DIAGNOSIS — M170 Bilateral primary osteoarthritis of knee: Secondary | ICD-10-CM

## 2020-08-04 NOTE — Assessment & Plan Note (Signed)
Partial improvement with Tylenol, orthotic, but is still has significant discomfort even with physical therapy, [place] is coming up in about 11 days. Today we injected both of her knees, return to see me in a month.

## 2020-08-04 NOTE — Assessment & Plan Note (Signed)
Pain resolved with custom molded orthotics.

## 2020-08-04 NOTE — Progress Notes (Signed)
    Procedures performed today:    Procedure: Real-time Ultrasound Guided injection of the left knee Device: Samsung HS60  Verbal informed consent obtained.  Time-out conducted.  Noted no overlying erythema, induration, or other signs of local infection.  Skin prepped in a sterile fashion.  Local anesthesia: Topical Ethyl chloride.  With sterile technique and under real time ultrasound guidance:  Noted mild effusion 1 cc Kenalog 40, 2 cc lidocaine, 2 cc bupivacaine injected easily Completed without difficulty  Advised to call if fevers/chills, erythema, induration, drainage, or persistent bleeding.  Images permanently stored and available for review in PACS.  Impression: Technically successful ultrasound guided injection.  Procedure: Real-time Ultrasound Guided injection of the right knee Device: Samsung HS60  Verbal informed consent obtained.  Time-out conducted.  Noted no overlying erythema, induration, or other signs of local infection.  Skin prepped in a sterile fashion.  Local anesthesia: Topical Ethyl chloride.  With sterile technique and under real time ultrasound guidance:  Noted mild effusion 1 cc Kenalog 40, 2 cc lidocaine, 2 cc bupivacaine injected easily Completed without difficulty  Advised to call if fevers/chills, erythema, induration, drainage, or persistent bleeding.  Images permanently stored and available for review in PACS.  Impression: Technically successful ultrasound guided injection.  Independent interpretation of notes and tests performed by another provider:   None.  Brief History, Exam, Impression, and Recommendations:    Primary osteoarthritis of both knees Partial improvement with Tylenol, orthotic, but is still has significant discomfort even with physical therapy, [place] is coming up in about 11 days. Today we injected both of her knees, return to see me in a month.  Chronic pain of right ankle Pain resolved with custom molded  orthotics.    ___________________________________________ Gwen Her. Dianah Field, M.D., ABFM., CAQSM. Primary Care and [place] Instructor of [place] [facility] of Medicine

## 2020-08-10 ENCOUNTER — Encounter: Payer: Self-pay | Admitting: *Deleted

## 2020-08-11 ENCOUNTER — Ambulatory Visit: Payer: Managed Care, Other (non HMO) | Admitting: Physical Therapy

## 2020-08-11 ENCOUNTER — Other Ambulatory Visit: Payer: Self-pay

## 2020-08-11 ENCOUNTER — Encounter: Payer: Self-pay | Admitting: Physical Therapy

## 2020-08-11 DIAGNOSIS — M25562 Pain in left knee: Secondary | ICD-10-CM

## 2020-08-11 DIAGNOSIS — M25571 Pain in right ankle and joints of right foot: Secondary | ICD-10-CM

## 2020-08-11 DIAGNOSIS — R262 Difficulty in walking, not elsewhere classified: Secondary | ICD-10-CM

## 2020-08-11 DIAGNOSIS — M25561 Pain in right knee: Secondary | ICD-10-CM | POA: Diagnosis not present

## 2020-08-11 NOTE — Patient Instructions (Signed)
        Access Code: [email or web address] URL: [email or web address] Date: 08/11/2020 Prepared by: Annie Paras  Exercises Standing ITB Stretch - 2 x daily - 7 x weekly - 3-5 reps - 30 sec hold Supine ITB Stretch with Strap - 2 x daily - 7 x weekly - 3-5 reps - 30 sec hold Seated Ankle Plantar Flexion with Resistance Loop - 1 x daily - 7 x weekly - 2 sets - 10 reps - 3-5 sec hold Seated Ankle Dorsiflexion with Resistance - 1 x daily - 7 x weekly - 2 sets - 10 reps - 3-5 sec hold Seated Ankle Eversion with Resistance - 1 x daily - 7 x weekly - 2 sets - 10 reps - 3-5 sec hold Seated Ankle Inversion with Resistance - 1 x daily - 7 x weekly - 2 sets - 10 reps - 3-5 sec hold Standing Eccentric Heel Raise - 1 x daily - 7 x weekly - 2 sets - 10 reps - 3-5 sec hold Eccentric Heel Lowering on Step - 1 x daily - 7 x weekly - 2 sets - 10 reps - 3-5 sec hold Side Stepping with Resistance at Feet - 1 x daily - 7 x weekly - 2 sets - 10 reps Forward and Backward Monster Walk with Resistance at Ankles and Counter Support - 1 x daily - 7 x weekly - 2 sets - 10 reps

## 2020-08-11 NOTE — Therapy (Signed)
[facility] [address]  [place], [place], [address] Phone: [phone]   Fax:  [phone]  Physical Therapy Treatment  Patient Details  Name: Shawna Johnson MRN: [id number] Date of Birth: 10-26-1965 Referring Provider (PT): Dr. Darene Lamer   Encounter Date: 08/11/2020   PT End of Session - 08/11/20 0930    Visit Number 2    Date for PT Re-Evaluation 10/01/20    Authorization Type [employer]    PT Start Time 0930    PT Stop Time 1009    PT Time Calculation (min) 39 min    Activity Tolerance Patient tolerated treatment well    Behavior During Therapy [facility] for tasks assessed/performed           Past Medical History:  Diagnosis Date  . Abnormal alkaline phosphatase test 04/24/2016  . Arthritis   . Elevated alkaline phosphatase level   . Esophageal stenosis   . Fatty liver   . Gastric ulcer   . 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 Hyperlipidemia   . Preventative health care 10/24/2015    Past Surgical History:  Procedure Laterality Date  . CERVICAL CONIZATION W/BX  05/2006  . COSMETIC SURGERY     eyelid  . INNER EAR SURGERY     several childhood  . TONSILLECTOMY AND ADENOIDECTOMY      There were no vitals filed for this visit.   Subjective Assessment - 08/11/20 0934    Subjective Pt reports she is still having good pain relief from the cortisone injections with no pain today. Initial HEP went well with no concerns reported.    Pertinent History hearing loss - try to stay in front of pt while speaking to her    Patient Stated Goals be stronger, be able to go to the gym    Currently in Pain? No/denies                             [facility] Adult PT Treatment/Exercise - 08/11/20 0930      Exercises   Exercises Knee/Hip;Ankle      Knee/Hip Exercises: Public affairs consultant Limitations verbally reviewed prone quad stretch - pt declined need to practice    Hip Flexor Stretch Right;Left;1 rep;30 seconds     Hip Flexor Stretch Limitations mod thomas with strap - cues to keep knee below hip height to maximze stretch    ITB Stretch Right;Left;2 reps;30 seconds    ITB Stretch Limitations supine cross-body with strap & standing lateral lean at wall    Piriformis Stretch Right;Left;1 rep;30 seconds    Piriformis Stretch Limitations supine figure 4 with overpressure & KTOS - guidance provided on variations to maximize/customize stretch    Gastroc Stretch Right;Left;3 reps;30 seconds    Gastroc Stretch Limitations negtaice heel on edge of step, toes on baseboard & runner's stretch    Soleus Stretch Right;Left;1 rep;30 seconds    Soleus Stretch Limitations runner's stretch with knee flexed      Knee/Hip Exercises: Aerobic   Recumbent Bike L1 x 6 min      Knee/Hip Exercises: Standing   Other Standing Knee Exercises B red TB lateral band walk wiht band at midfoot & fwd/back monster walk with band at ankles 2 x 20 ft      Ankle Exercises: Standing   Heel Raises Both;10 reps;3 seconds;Right;Left   2 sets   Heel Raises Limitations B con/alt LE eccentric from floor (  1st set), negative heel off edge of step (2nd set)      Ankle Exercises: Supine   T-Band Seated green TB 4-way ankle x 10                  PT Education - 08/11/20 1007    Education Details HEP update - Access Code: [email or web address]    Person(s) Educated Patient    Methods Explanation;Demonstration;Verbal cues;Handout    Comprehension Verbalized understanding;Verbal cues required;Need further instruction            PT Short Term Goals - 08/11/20 0935      PT SHORT TERM GOAL #1   Title independent with initial HEP    Time 2    Period Weeks    Status On-going    Target Date 08/17/20             PT Long Term Goals - 08/11/20 0936      PT LONG TERM GOAL #1   Title independent with advanced HEP    Time 8    Period Weeks    Status On-going    Target Date 10/01/20      PT LONG TERM GOAL #2   Title return to the gym 1-2  x / week without increased pain > 4/10    Time 8    Period Weeks    Status On-going    Target Date 10/01/20      PT LONG TERM GOAL #3   Title increase right ankle DF to 15 degrees    Time 8    Period Weeks    Status On-going    Target Date 10/01/20      PT LONG TERM GOAL #4   Title descend stairs without pain > 4/10    Time 8    Period Weeks    Status On-going    Target Date 10/01/20                 Plan - 08/11/20 0737    Clinical Impression Statement Shawna Johnson reports injections and orthotics have kept her knee and ankle pain under control. Initial HEP stretches reviewed, providing clarification of proper positioning as well as potential modifications to help maximize stretches for best benefit. HEP progressed with additions of ITB stretching along with ankle and proximal LE strengthening to provide better LE stability - pt able to perform good return demonstration of all exercises with no problems noted. Shawna Johnson admits she may not be able to get to the exercises as consistently while traveling to [facility] next week but plans to bring the instructions and bands with her.    Rehab Potential Good    PT Frequency 1x / week    PT Duration 8 weeks    PT Treatment/Interventions ADLs/Self Care Home Management;Cryotherapy;Electrical Stimulation;Moist Heat;Ultrasound;Gait training;Neuromuscular re-education;Balance training;Therapeutic exercise;Therapeutic activities;Iontophoresis 4mg /ml Dexamethasone;Functional mobility training;Stair training;Patient/family education;Manual techniques    PT Next Visit Plan proximal LE & ankle flexibility address any ongoing tigthness, proximal LE and ankle strengthening adding proprioceptive training; review and update HEP as indicated    PT Home Exercise Plan Access Code: [email or web address] (2/2), [email or web address] (2/10)    Consulted and Agree with Plan of Care Patient           Patient will benefit from skilled therapeutic intervention in order to improve the  following deficits and impairments:  Abnormal gait,Decreased range of motion,Difficulty walking,Pain,Impaired flexibility,Decreased strength,Decreased mobility  Visit Diagnosis: Acute pain of right knee  Acute pain of left knee  Pain in right ankle and joints of right foot  Difficulty in walking, not elsewhere classified     Problem List Patient Active Problem List   Diagnosis Date Noted  . Primary osteoarthritis of both knees 07/21/2020  . Chronic pain of right ankle 07/21/2020  . Peptic ulcer 07/12/2020  . Rotator cuff tendinitis, left 07/12/2020  . Cervical cancer screening 12/14/2019  . Palpitations 12/09/2018  . Mixed conductive and sensorineural hearing loss of right ear with restricted hearing of left ear 01/30/2018  . Perforation of right tympanic membrane 01/30/2018  . 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 Abnormal alkaline phosphatase test 04/24/2016  . Preventative health care 10/24/2015  . Arthritis   . Hepatic steatosis 08/23/2014  . Multiple thyroid nodules  see U/S 2015 mm sized in left lobe 05/16/2014  . Breast calcifications on mammogram  R breast stable for 18 months [facility] 01/07/2014  . Breast nodule  decreasing size mm 09/2013 [facility] 01/07/2014  . 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 Abnormal Pap smear of cervix  S/P Leep normal pap since 2007 12/22/2013  . Hyperlipidemia 12/22/2013  . Left shoulder pain 10/08/2013  . Left hip pain 10/08/2013  . Right hip pain 10/08/2013    Percival Spanish, PT, MPT 08/11/2020, 10:41 AM  [facility] [address]  [place], [place], [address] Phone: [phone]   Fax:  [phone]  Name: Nieshia Larmon MRN: [id number] Date of Birth: February 02, 1966

## 2020-08-24 ENCOUNTER — Encounter: Payer: Self-pay | Admitting: Internal Medicine

## 2020-08-24 ENCOUNTER — Ambulatory Visit: Payer: Managed Care, Other (non HMO) | Admitting: Internal Medicine

## 2020-08-24 ENCOUNTER — Other Ambulatory Visit: Payer: Self-pay

## 2020-08-24 VITALS — BP 122/74 | HR 59 | Ht 62.0 in | Wt 143.0 lb

## 2020-08-24 DIAGNOSIS — K222 Esophageal obstruction: Secondary | ICD-10-CM | POA: Diagnosis not present

## 2020-08-24 DIAGNOSIS — K219 Gastro-esophageal reflux disease without esophagitis: Secondary | ICD-10-CM | POA: Diagnosis not present

## 2020-08-24 NOTE — Progress Notes (Signed)
   Subjective:    Patient ID: Shawna Johnson, female    DOB: 1965/07/15, 55 y.o.   MRN: [id number]  HPI Shawna Johnson is a 55 year old female with a history of GERD with esophagitis, peptic ulcer disease who is seen for follow-up.  I saw her in the office on 04/25/2020 to discuss esophageal dysphagia and for upper endoscopy performed on 06/09/2020.  She is here alone today.  EGD performed on 06/09/2020 showed mucosal changes with longitudinal furrows raising the question of EoE.  There was a benign moderate stenosis at the GE junction dilated to 16 mm with balloon.  There was gastric ulceration x1 and 5 superficial duodenal ulcers.  Esophageal biopsies showed no significant inflammation or evidence of eosinophilic esophagitis.  Gastric biopsy showed mild chronic inflammation without H. Pylori.  She was treated with pantoprazole 40 mg twice daily x1 month and then has remained on pantoprazole 40 mg once daily at present.  Her dysphagia symptoms have completely resolved.  She is having no heartburn.  No odynophagia.  No abdominal pain.  She had used Advil but very sparingly may be 400 mg 3 times per month.   Review of Systems As per HPI, otherwise negative  Current Medications, Allergies, Past Medical History, Past Surgical History, Family History and Social History were reviewed in [employer] record.     Objective:   Physical Exam BP 122/74   Pulse (!) 59   Ht 5\' 2"  (1.575 m)   Wt 143 lb (64.9 kg)   LMP 07/02/2013   BMI 26.16 kg/m  Gen: awake, alert, NAD HEENT: anicteric  Neuro: nonfocal     Assessment & Plan:  55 year old female with a history of GERD with esophagitis, peptic ulcer disease who is seen for follow-up.  1.  GERD/solid food dysphagia --no evidence for eosinophilic esophagitis by biopsies.  We discussed how GERD likely explain the mucosal changes as well as the stricture in the esophagus.  This has responded completely to PPI and esophageal  dilation.  Time will tell as to whether she will need chronic PPI therapy.  For now I would like her to complete 3 months of therapy and then stop PPI altogether.  She will stop PPI around 09/13/2020  If she develops heartburn or recurrent dysphagia she is asked to notify me and we would likely resume once daily pantoprazole.  She can use famotidine 20 mg up to twice daily as needed if she has some rebound reflux symptoms after stopping pantoprazole.  Future dilations would be symptom driven only.  There was no Barrett's esophagus seen. --Pantoprazole 40 mg to stop as above, resume if symptoms recur  2. CRC screening -- Jan 2026  20 minutes total spent today including patient facing time, coordination of care, reviewing medical history/procedures/pertinent radiology studies, and documentation of the encounter.

## 2020-08-24 NOTE — Patient Instructions (Signed)
Discontinue pantoprazole in March. Let's see if your heartburn and difficulty swallowing return after discontinuing the pantoprazole. If so, let us know so we can come up with a plan.  You may use over the counter famotidine as a "bridge" medication while coming off of the pantoprazole for any breakthrough heartburn.  If you are age 57 or younger, your body mass index should be between 19-25. Your Body mass index is 26.16 kg/m. If this is out of the aformentioned range listed, please consider follow up with your Primary Care Provider.   Due to recent changes in healthcare laws, you may see the results of your imaging and laboratory studies on MyChart before your provider has had a chance to review them.  We understand that in some cases there may be results that are confusing or concerning to you. Not all laboratory results come back in the same time frame and the provider may be waiting for multiple results in order to interpret others.  Please give [place] 48 hours in order for your provider to thoroughly review all the results before contacting the office for clarification of your results.

## 2020-08-25 ENCOUNTER — Other Ambulatory Visit: Payer: Self-pay

## 2020-08-25 ENCOUNTER — Ambulatory Visit: Payer: Managed Care, Other (non HMO)

## 2020-08-25 DIAGNOSIS — M25561 Pain in right knee: Secondary | ICD-10-CM | POA: Diagnosis not present

## 2020-08-25 DIAGNOSIS — R262 Difficulty in walking, not elsewhere classified: Secondary | ICD-10-CM

## 2020-08-25 DIAGNOSIS — M25571 Pain in right ankle and joints of right foot: Secondary | ICD-10-CM

## 2020-08-25 DIAGNOSIS — M25562 Pain in left knee: Secondary | ICD-10-CM

## 2020-08-25 NOTE — Therapy (Addendum)
[facility] [address]  [place], [place], [address] Phone: [phone]   Fax:  [phone]  Physical Therapy Treatment / Discharge Summary  Patient Details  Name: Shawna Johnson MRN: [id number] Date of Birth: 07-Apr-1966 Referring Provider (PT): Dr. Darene Lamer   Encounter Date: 08/25/2020   PT End of Session - 08/25/20 1023    Visit Number 3    Date for PT Re-Evaluation 10/01/20    Authorization Type Cigna    PT Start Time 0929    PT Stop Time 1015    PT Time Calculation (min) 46 min    Activity Tolerance Patient tolerated treatment well    Behavior During Therapy [facility] for tasks assessed/performed           Past Medical History:  Diagnosis Date  . Abnormal alkaline phosphatase test 04/24/2016  . Arthritis   . Elevated alkaline phosphatase level   . Esophageal stenosis   . Fatty liver   . Gastric ulcer   . 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 Hyperlipidemia   . Preventative health care 10/24/2015    Past Surgical History:  Procedure Laterality Date  . CERVICAL CONIZATION W/BX  05/2006  . COSMETIC SURGERY     eyelid  . INNER EAR SURGERY     several childhood  . TONSILLECTOMY AND ADENOIDECTOMY      There were no vitals filed for this visit.   Subjective Assessment - 08/25/20 0928    Subjective Pt reports that she was active hiking during vacation but did not get to do a lot of her exercises. Reports that the cortizone shot worked well, as she had no pain while on vacation.    Pertinent History hearing loss - try to stay in front of pt while speaking to her    Patient Stated Goals be stronger, be able to go to the gym    Currently in Pain? No/denies                             [facility] Adult PT Treatment/Exercise - 08/25/20 0001      Exercises   Exercises Knee/Hip;Ankle      Knee/Hip Exercises: Stretches   Sports administrator Both;1 rep;30 seconds    Piriformis Stretch Right;Left;1 rep;30 seconds     Gastroc Stretch Right;Left;1 rep;30 seconds      Knee/Hip Exercises: Aerobic   Recumbent Bike [id number]      Knee/Hip Exercises: Standing   Functional Squat 2 sets;10 reps    Functional Squat Limitations treadmill for assistance    Wall Squat 1 set;10 reps;5 seconds    Wall Squat Limitations orange physio ball    Other Standing Knee Exercises lateral walk 2x w/o resistance, 3x with R tband 151f   Other Standing Knee Exercises marches with Rtband 10 ftx4      Knee/Hip Exercises: Supine   Bridges Strengthening;Both;2 sets;10 reps    Bridges Limitations Rtband, hip abd    Bridges with BaCardinal Healthtrengthening;Both;2 sets;10 reps      Knee/Hip Exercises: Sidelying   Clams Rtband, 10x5sec each               Balance Exercises - 08/25/20 0001      Balance Exercises: Standing   Marching Foam/compliant surface;10 reps   from airex   Other Standing Exercises trunk rotation with EC, feet together 10x  PT Education - 08/25/20 1025    Education Details HEP update- Access Code: [email or web address]    Person(s) Educated Patient    Methods Explanation;Demonstration;Verbal cues;Handout    Comprehension Verbalized understanding;Verbal cues required;Tactile cues required            PT Short Term Goals - 08/11/20 0935      PT SHORT TERM GOAL #1   Title independent with initial HEP    Time 2    Period Weeks    Status On-going    Target Date 08/17/20             PT Long Term Goals - 08/11/20 0936      PT LONG TERM GOAL #1   Title independent with advanced HEP    Time 8    Period Weeks    Status On-going    Target Date 10/01/20      PT LONG TERM GOAL #2   Title return to the gym 1-2 x / week without increased pain > 4/10    Time 8    Period Weeks    Status On-going    Target Date 10/01/20      PT LONG TERM GOAL #3   Title increase right ankle DF to 15 degrees    Time 8    Period Weeks    Status On-going    Target Date 10/01/20      PT LONG TERM GOAL  #4   Title descend stairs without pain > 4/10    Time 8    Period Weeks    Status On-going    Target Date 10/01/20                 Plan - 08/25/20 1106    Clinical Impression Statement Pt stated that she was not able to complete home exercises d/t being busy and away for vacation. Added wall squats, clamshells, and bridges to HEP with demonstrations, VCs, and verbal understanding from patient. Pt needed cues during squats to keep knees from going above toes, to prevent excessive stress on knees. Pt responded well to balance exercises and reported no pain with any exercises. Pt did note that now she is home she will be more compliant with HEP.    PT Frequency 1x / week    PT Duration 8 weeks    PT Treatment/Interventions ADLs/Self Care Home Management;Cryotherapy;Electrical Stimulation;Moist Heat;Ultrasound;Gait training;Neuromuscular re-education;Balance training;Therapeutic exercise;Therapeutic activities;Iontophoresis 69m/ml Dexamethasone;Functional mobility training;Stair training;Patient/family education;Manual techniques    PT Next Visit Plan proximal LE & ankle flexibility address any ongoing tigthness, proximal LE and ankle strengthening adding proprioceptive training; review and update HEP as indicated    PT Home Exercise Plan Access Code; [email or web address]    Consulted and Agree with Plan of Care Patient           Patient will benefit from skilled therapeutic intervention in order to improve the following deficits and impairments:  Abnormal gait,Decreased range of motion,Difficulty walking,Pain,Impaired flexibility,Decreased strength,Decreased mobility  Visit Diagnosis: Acute pain of right knee  Acute pain of left knee  Pain in right ankle and joints of right foot  Difficulty in walking, not elsewhere classified     Problem List Patient Active Problem List   Diagnosis Date Noted  . Primary osteoarthritis of both knees 07/21/2020  . Chronic pain of right ankle 07/21/2020   . Peptic ulcer 07/12/2020  . Rotator cuff tendinitis, left 07/12/2020  . Cervical cancer screening 12/14/2019  . Palpitations 12/09/2018  . Mixed conductive and  sensorineural hearing loss of right ear with restricted hearing of left ear 01/30/2018  . Perforation of right tympanic membrane 01/30/2018  . 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 Abnormal alkaline phosphatase test 04/24/2016  . Preventative health care 10/24/2015  . Arthritis   . Hepatic steatosis 08/23/2014  . Multiple thyroid nodules  see U/S 2015 mm sized in left lobe 05/16/2014  . Breast calcifications on mammogram  R breast stable for 18 months [facility] 01/07/2014  . Breast nodule  decreasing size mm 09/2013 [facility] 01/07/2014  . 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 Abnormal Pap smear of cervix  S/P Leep normal pap since 2007 12/22/2013  . Hyperlipidemia 12/22/2013  . Left shoulder pain 10/08/2013  . Left hip pain 10/08/2013  . Right hip pain 10/08/2013    Artist Pais, PTA 08/25/2020, 11:28 AM  [facility] [address]  [place], [place], [address] Phone: [phone]   Fax:  [phone]  Name: Shawna Johnson MRN: [id number] Date of Birth: Apr 20, 1966   PHYSICAL THERAPY DISCHARGE SUMMARY  Visits from Start of Care: 3  Current functional level related to goals / functional outcomes:   Refer to above clinical impression for status as of last visit on 08/25/2020. Patient failed to return to PT within the next 30 days, therefore will proceed with discharge from PT for this episode.   Remaining deficits:   As above. Unable to formally assess due to failure to return to PT.   Education / Equipment:   HEP  Plan: Patient agrees to discharge.  Patient goals were not met. Patient is being discharged due to not returning since the last visit.  ?????     Percival Spanish, PT, MPT 11/01/20, 11:46 AM  [facility] [address]  [place], [place], [address] Phone: [phone]   Fax:  [phone]

## 2020-09-01 ENCOUNTER — Telehealth: Payer: Self-pay | Admitting: Sports Medicine

## 2020-09-01 ENCOUNTER — Other Ambulatory Visit: Payer: Self-pay

## 2020-09-01 ENCOUNTER — Ambulatory Visit: Payer: Managed Care, Other (non HMO) | Admitting: Sports Medicine

## 2020-09-01 DIAGNOSIS — M170 Bilateral primary osteoarthritis of knee: Secondary | ICD-10-CM | POA: Diagnosis not present

## 2020-09-01 NOTE — Assessment & Plan Note (Signed)
Ginger returns, she had a great time to [facility], her injections did provide at least 3 weeks of relief, orthotics are provided good relief of her ankle pain. Unfortunately she has a recurrence of discomfort in her knees, her mother did well with viscosupplementation so we will try to get her approved as well, and she is actually going to research it and she will let us know if she would like to start. Unfortunately she is unable to use NSAIDs.

## 2020-09-01 NOTE — Progress Notes (Signed)
° ° °  Procedures performed today:    None.  Independent interpretation of notes and tests performed by another provider:   None.  Brief History, Exam, Impression, and Recommendations:    Primary osteoarthritis of both knees Shawna Johnson returns, she had a great time to [facility], Johnson injections did provide at least 3 weeks of relief, orthotics are provided good relief of Johnson ankle pain. Unfortunately she has a recurrence of discomfort in Johnson knees, Johnson mother did well with viscosupplementation so we will try to get Johnson approved as well, and she is actually going to research it and she will let us know if she would like to start. Unfortunately she is unable to use NSAIDs.    ___________________________________________ Shawna Johnson. Shawna Johnson, M.D., ABFM., CAQSM. Primary Care and [place] Instructor of [place] of [facility] of Medicine

## 2020-09-01 NOTE — Patient Instructions (Signed)
Sodium Hyaluronate intra-articular injection What is this medicine? SODIUM HYALURONATE (SOE dee um hye al yoor ON ate) is used to treat pain in the knee due to osteoarthritis. This medicine may be used for other purposes; ask your health care provider or pharmacist if you have questions. COMMON BRAND NAME(S): Amvisc, DUROLANE, Euflexxa, GELSYN-3, Hyalgan, Hymovis, Monovisc, Orthovisc, Supartz, Supartz FX, TriVisc, VISCO What should I tell my health care provider before I take this medicine? They need to know if you have any of these conditions:  bleeding disorders  glaucoma  infection in the knee joint  skin conditions or sensitivity  skin infection  an unusual allergic reaction to sodium hyaluronate, other medicines, foods, dyes, or preservatives. Different brands of sodium hyaluronate contain different allergens. Some may contain egg. Talk to your doctor about your allergies to make sure that you get the right product.  pregnant or trying to get pregnant  breast-feeding How should I use this medicine? This medicine is for injection into the knee joint. It is given by a health care professional in a hospital or clinic setting. Talk to your pediatrician regarding the use of this medicine in children. Special care may be needed. Overdosage: If you think you have taken too much of this medicine contact a poison control center or emergency room at once. NOTE: This medicine is only for you. Do not share this medicine with others. What if I miss a dose? This does not apply. What may interact with this medicine? Interactions are not expected. This list may not describe all possible interactions. Give your health care provider a list of all the medicines, herbs, non-prescription drugs, or dietary supplements you use. Also tell them if you smoke, drink alcohol, or use illegal drugs. Some items may interact with your medicine. What should I watch for while using this medicine? Tell your  doctor or healthcare professional if your symptoms do not start to get better or if they get worse. If receiving this medicine for osteoarthritis, limit your activity after you receive your injection. Avoid physical activity for 48 hours following your injection to keep your knee from swelling. Do not stand on your feet for more than 1 hour at a time during the first 48 hours following your injection. Ask your doctor or healthcare professional about when you can begin major physical activity again. What side effects may I notice from receiving this medicine? Side effects that you should report to your doctor or health care professional as soon as possible:  allergic reactions like skin rash, itching or hives, swelling of the face, lips, or tongue  dizziness  facial flushing  pain, tingling, numbness in the hands or feet  vision changes if received this medicine during eye surgery Side effects that usually do not require medical attention (report to your doctor or health care professional if they continue or are bothersome):  back pain  bruising at site where injected  chills  diarrhea  fever  headache  joint pain  joint stiffness  joint swelling  muscle cramps  muscle pain  nausea, vomiting  pain, redness, or irritation at site where injected  weak or tired This list may not describe all possible side effects. Call your doctor for medical advice about side effects. You may report side effects to FDA at 1-800-FDA-1088. Where should I keep my medicine? This drug is given in a hospital or clinic and will not be stored at home. NOTE: This sheet is a summary. It may not cover all   possible information. If you have questions about this medicine, talk to your doctor, pharmacist, or health care provider.  2021 Elsevier/Gold Standard (2015-07-21 08:34:51)  

## 2020-09-01 NOTE — Telephone Encounter (Signed)
Please work on [place], if not Orthovisc any other agent is fine.  Bilateral.

## 2020-09-02 ENCOUNTER — Telehealth: Payer: Self-pay

## 2020-09-02 NOTE — Telephone Encounter (Signed)
Patient called to state that she would like to proceed with viscosupplementation.

## 2020-09-04 NOTE — Telephone Encounter (Signed)
Okay, no problem, I already sent a message to [place] to start approval process for Orthovisc or any other Visco supplement

## 2020-09-08 ENCOUNTER — Ambulatory Visit (INDEPENDENT_AMBULATORY_CARE_PROVIDER_SITE_OTHER): Payer: Managed Care, Other (non HMO) | Admitting: Family Medicine

## 2020-09-08 ENCOUNTER — Other Ambulatory Visit: Payer: Self-pay

## 2020-09-08 VITALS — BP 157/87 | HR 73 | Temp 97.9°F | Resp 20 | Ht 62.0 in | Wt 143.0 lb

## 2020-09-08 DIAGNOSIS — Z23 Encounter for immunization: Secondary | ICD-10-CM

## 2020-09-08 NOTE — Progress Notes (Signed)
Established Patient Office Visit  Subjective:  Patient ID: Shawna Johnson, female    DOB: [birth year]  Age: 55 y.o. MRN: [id number]  CC:  Chief Complaint  Patient presents with  . Immunizations    HPI [name] presents for her second HepA vaccine.   Past Medical History:  Diagnosis Date  . Abnormal alkaline phosphatase test 04/24/2016  . Arthritis   . Elevated alkaline phosphatase level   . Esophageal stenosis   . Fatty liver   . Gastric ulcer   . 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 Hyperlipidemia   . Preventative health care 10/24/2015    Past Surgical History:  Procedure Laterality Date  . CERVICAL CONIZATION W/BX  05/2006  . COSMETIC SURGERY     eyelid  . INNER EAR SURGERY     several childhood  . TONSILLECTOMY AND ADENOIDECTOMY      Family History  Problem Relation Age of Onset  . Heart attack Father   . Hyperlipidemia Father   . Hypertension Father   . Sudden death Father   . Heart disease Father        MI  . Alcohol abuse Father   . Congestive Heart Failure Sister   . COPD Sister   . Heart disease Sister        has pacer/defib in place   . Cardiomyopathy Sister   . Drug abuse Sister        crack in past  . Mental illness Maternal Grandfather        suicide  . Heart disease Paternal Grandmother   . Heart disease Paternal Grandfather   . Breast cancer Maternal Aunt   . Melanoma Maternal Aunt   . Diabetes Maternal Aunt   . Colon cancer Neg Hx   . Esophageal cancer Neg Hx   . Stomach cancer Neg Hx   . Rectal cancer Neg Hx     Social History   Socioeconomic History  . Marital status: Married    Spouse name: Not on file  . Number of children: 1  . Years of education: Not on file  . Highest education level: Not on file  Occupational History  . Occupation: Stylist  Tobacco Use  . Smoking status: Former Smoker    Packs/day: 0.50    Years: 30.00    Pack years: 15.00    Types: Cigarettes    Quit date: 07/02/2010    Years since quitting:  10.1  . Smokeless tobacco: Former Systems developer    Quit date: 04/24/2016  Vaping Use  . Vaping Use: Never used  Substance and Sexual Activity  . Alcohol use: Yes    Alcohol/week: 4.0 standard drinks    Types: 4 Glasses of wine per week    Comment: weekly  . Drug use: No  . 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 Not on file  Social History Narrative  . Not on file   Social Determinants of Health   Financial Resource Strain: Not on file  Food Insecurity: Not on file  Transportation Needs: Not on file  Physical Activity: Not on file  Stress: Not on file  Social Connections: Not on file  Intimate Partner Violence: Not on file    Outpatient Medications Prior to Visit  Medication Sig Dispense Refill  . acetaminophen (TYLENOL) 650 MG CR tablet  Take 1 tablet (650 mg total) by mouth every 8 (eight) hours as needed for pain. 90 tablet 3  . atorvastatin (LIPITOR) 10 MG tablet TAKE ONE-HALF (1/2) TABLET BY MOUTH DAILY 45 tablet 3  . calcium carbonate (OS-CAL) 1250 (500 Ca) MG chewable tablet Chew 2 tablets by mouth daily.    . pantoprazole (PROTONIX) 40 MG tablet Take 1 tablet (40 mg total) by mouth 2 (two) times daily. Take 1 tablet 40 mg twice daily for 1 month, then once daily for 2 months. 90 tablet 3  . vitamin C (ASCORBIC ACID) 250 MG tablet Take 250 mg by mouth daily.     No facility-administered medications prior to visit.    Allergies  Allergen Reactions  . Sulfa Antibiotics Swelling    ROS Review of Systems    Objective:    Physical Exam  BP (!) 157/87 (BP Location: Left Arm, Patient Position: Sitting, Cuff Size: Normal)   Pulse 73   Temp 97.9 F (36.6 C) (Oral)   Resp 20   Ht 5\' 2"  (1.575 m)   Wt 143 lb (64.9 kg)   LMP 07/02/2013   SpO2 99%   BMI 26.16 kg/m  Wt Readings from Last 3  Encounters:  09/08/20 143 lb (64.9 kg)  08/24/20 143 lb (64.9 kg)  07/22/20 148 lb (67.1 kg)     Health Maintenance Due  Topic Date Due  . HIV Screening  Never done    There are no preventive care reminders to display for this patient.  Lab Results  Component Value Date   TSH 0.86 12/10/2019   Lab Results  Component Value Date   WBC 7.3 12/10/2019   HGB 13.7 12/10/2019   HCT 41.8 12/10/2019   MCV 91.0 12/10/2019   PLT 349.0 12/10/2019   Lab Results  Component Value Date   NA 139 12/10/2019   K 4.1 12/10/2019   CO2 30 12/10/2019   GLUCOSE 92 12/10/2019   BUN 14 12/10/2019   CREATININE 0.76 12/10/2019   BILITOT 0.4 12/10/2019   ALKPHOS 117 12/10/2019   AST 15 12/10/2019   ALT 15 12/10/2019   PROT 6.7 12/10/2019   ALBUMIN 4.5 12/10/2019   CALCIUM 9.5 12/10/2019   GFR 78.75 12/10/2019   Lab Results  Component Value Date   CHOL 187 12/10/2019   Lab Results  Component Value Date   HDL 53.40 12/10/2019   Lab Results  Component Value Date   LDLCALC 113 (H) 12/10/2019   Lab Results  Component Value Date   TRIG 103.0 12/10/2019   Lab Results  Component Value Date   CHOLHDL 4 12/10/2019   No results found for: HGBA1C    Assessment & Plan:  HepA injection given in left deltoid. Pt tolerated injection well without complications.  Problem List Items Addressed This Visit   None   Visit Diagnoses    Need for hepatitis A immunization    -  Primary   Relevant Orders   Hepatitis A vaccine adult IM (Completed)      No orders of the defined types were placed in this encounter.   Follow-up: No follow-ups on file.    Ninfa Meeker, CMA

## 2020-09-08 NOTE — Progress Notes (Signed)
Medical screening examination/treatment was performed by qualified clinical staff member and as supervising physician I was immediately available for consultation/collaboration. I have reviewed documentation and agree with assessment and plan.  Cody Matthews, DO  

## 2020-09-15 NOTE — Telephone Encounter (Signed)
Started Orthovisc and PA process waiting on BID and determination. - CF

## 2020-09-22 NOTE — Telephone Encounter (Signed)
I pulled the BID off of the Orthovisc site patient has met deductible and once her out of pocket is met she will be covered at 100 %.  She is going to received bilateral injections and is responsible for 20 percent co insurance.  Injection cost -2400.00 20% is 480.00  PA is required for medication so we do not know the cost on it. I submitted the Prior auth and I am waiting for determination which can take 1-15 days.   I called [facility] and explained all of this to her and let her know as soon as we got the determination if medication is approved we will call and get her on the schedule to start her injections. - CF

## 2020-09-22 NOTE — Telephone Encounter (Signed)
I spoke with Shawna Johnson and let her know I have not received her BID but I would check on that today and get back with her before I leave and if able we would go ahead and get her scheduled. - CF

## 2020-10-03 NOTE — Telephone Encounter (Signed)
I called Shawna Johnson and they stated even though the auth was on cover my meds they did not have it so I did it over the phone and received approval .   Case [id number] Valid 10/03/20 - 12/02/20  Patient is scheduled for injections. - CF

## 2020-10-05 NOTE — Telephone Encounter (Signed)
Please let patient know:  1. Okay to resume pantoprazole 40 mg daily given her recurrent and daily GERD symptoms  A. This medication is safe for her to use in the setting of daily symptoms -- it has been effective.  B. Annual follow-up is a great idea, I can see her in Feb 2023, sooner if needed  C. I do not think this medication will cause elevation in liver enzymes -- see #2. 2. Fatty liver -- imaging in the past has shown fatty liver which is the likely reason for previously elevated LFTs.  When LFTs checked last there were normal.  A.  We will discuss this over time and montior  Thanks JMP

## 2020-10-07 ENCOUNTER — Ambulatory Visit: Payer: Managed Care, Other (non HMO) | Admitting: Sports Medicine

## 2020-10-07 ENCOUNTER — Ambulatory Visit (INDEPENDENT_AMBULATORY_CARE_PROVIDER_SITE_OTHER): Payer: Managed Care, Other (non HMO)

## 2020-10-07 ENCOUNTER — Other Ambulatory Visit: Payer: Self-pay

## 2020-10-07 DIAGNOSIS — M170 Bilateral primary osteoarthritis of knee: Secondary | ICD-10-CM

## 2020-10-07 NOTE — Progress Notes (Signed)
    Procedures performed today:    Procedure: Real-time Ultrasound Guided injection of the left knee Device: Samsung HS60  Verbal informed consent obtained.  Time-out conducted.  Noted no overlying erythema, induration, or other signs of local infection.  Skin prepped in a sterile fashion.  Local anesthesia: Topical Ethyl chloride.  With sterile technique and under real time ultrasound guidance:  Noted trace effusion, 30 mg/2 mL of OrthoVisc (sodium hyaluronate) in a prefilled syringe was injected easily into the knee through a 22-gauge needle.   Completed without difficulty  Advised to call if fevers/chills, erythema, induration, drainage, or persistent bleeding.  Images permanently stored and available for review in PACS.  Impression: Technically successful ultrasound guided injection.  Procedure: Real-time Ultrasound Guided injection of the right knee Device: Samsung HS60  Verbal informed consent obtained.  Time-out conducted.  Noted no overlying erythema, induration, or other signs of local infection.  Skin prepped in a sterile fashion.  Local anesthesia: Topical Ethyl chloride.  With sterile technique and under real time ultrasound guidance:  Noted trace effusion, 30 mg/2 mL of OrthoVisc (sodium hyaluronate) in a prefilled syringe was injected easily into the knee through a 22-gauge needle.   Completed without difficulty  Advised to call if fevers/chills, erythema, induration, drainage, or persistent bleeding.  Images permanently stored and available for review in PACS.  Impression: Technically successful ultrasound guided injection.  Independent interpretation of notes and tests performed by another provider:   None.  Brief History, Exam, Impression, and Recommendations:    Primary osteoarthritis of both knees Moderate relief from steroid injections but still has discomfort, starting Orthovisc bilaterally today. Return in 1 week for Orthovisc No. 2 of 4 both  knees.    ___________________________________________ Gwen Her. Dianah Field, M.D., ABFM., CAQSM. Primary Care and [place] Instructor of [place] of [facility] of Medicine

## 2020-10-07 NOTE — Assessment & Plan Note (Signed)
Moderate relief from steroid injections but still has discomfort, starting Orthovisc bilaterally today. Return in 1 week for Orthovisc No. 2 of 4 both knees.

## 2020-10-13 ENCOUNTER — Ambulatory Visit: Payer: Managed Care, Other (non HMO) | Admitting: Sports Medicine

## 2020-10-13 ENCOUNTER — Other Ambulatory Visit: Payer: Self-pay

## 2020-10-13 ENCOUNTER — Ambulatory Visit (INDEPENDENT_AMBULATORY_CARE_PROVIDER_SITE_OTHER): Payer: Managed Care, Other (non HMO)

## 2020-10-13 DIAGNOSIS — M170 Bilateral primary osteoarthritis of knee: Secondary | ICD-10-CM | POA: Diagnosis not present

## 2020-10-13 NOTE — Assessment & Plan Note (Signed)
Orthovisc injection #2 of 4 into both knees, return in 1 week for #3 of 4 both knees

## 2020-10-13 NOTE — Progress Notes (Signed)
    Procedures performed today:    Procedure: Real-time Ultrasound Guidedinjection of the left knee Device: Samsung HS60  Verbal informed consent obtained.  Time-out conducted.  Noted no overlying erythema, induration, or other signs of local infection.  Skin prepped in a sterile fashion.  Local anesthesia: Topical Ethyl chloride.  With sterile technique and under real time ultrasound guidance: Noted trace effusion, 30 mg/2 mL of OrthoVisc (sodium hyaluronate) in a prefilled syringe was injected easily into the knee through a 22-gauge needle.   Completed without difficulty  Advised to call if fevers/chills, erythema, induration, drainage, or persistent bleeding.  Images permanently stored and available for review in PACS.  Impression: Technically successful ultrasound guided injection.  Procedure: Real-time Ultrasound Guidedinjection of the right knee Device: Samsung HS60  Verbal informed consent obtained.  Time-out conducted.  Noted no overlying erythema, induration, or other signs of local infection.  Skin prepped in a sterile fashion.  Local anesthesia: Topical Ethyl chloride.  With sterile technique and under real time ultrasound guidance: Noted trace effusion, 30 mg/2 mL of OrthoVisc (sodium hyaluronate) in a prefilled syringe was injected easily into the knee through a 22-gauge needle.   Completed without difficulty  Advised to call if fevers/chills, erythema, induration, drainage, or persistent bleeding.  Images permanently stored and available for review in PACS.  Impression: Technically successful ultrasound guided injection.  Independent interpretation of notes and tests performed by another provider:   None.  Brief History, Exam, Impression, and Recommendations:    Primary osteoarthritis of both knees Orthovisc injection #2 of 4 into both knees, return in 1 week for #3 of 4 both knees    ___________________________________________ Gwen Her. Dianah Field,  M.D., ABFM., CAQSM. Primary Care and [place] Instructor of [place] of [facility] of Medicine

## 2020-10-21 ENCOUNTER — Ambulatory Visit: Payer: Managed Care, Other (non HMO) | Admitting: Sports Medicine

## 2020-10-21 ENCOUNTER — Ambulatory Visit (INDEPENDENT_AMBULATORY_CARE_PROVIDER_SITE_OTHER): Payer: Managed Care, Other (non HMO)

## 2020-10-21 ENCOUNTER — Other Ambulatory Visit: Payer: Self-pay

## 2020-10-21 DIAGNOSIS — M170 Bilateral primary osteoarthritis of knee: Secondary | ICD-10-CM

## 2020-10-21 NOTE — Assessment & Plan Note (Signed)
Orthovisc injection #3 of 4 both knees, return in 1 week for #4 of 4. 

## 2020-10-21 NOTE — Progress Notes (Signed)
    Procedures performed today:    Procedure: Real-time Ultrasound Guidedinjection of theleft knee Device: Samsung HS60  Verbal informed consent obtained.  Time-out conducted.  Noted no overlying erythema, induration, or other signs of local infection.  Skin prepped in a sterile fashion.  Local anesthesia: Topical Ethyl chloride.  With sterile technique and under real time ultrasound guidance:Noted trace effusion, 30 mg/2 mL of OrthoVisc (sodium hyaluronate) in a prefilled syringe was injected easily into the knee through a 22-gauge needle.  Completed without difficulty  Advised to call if fevers/chills, erythema, induration, drainage, or persistent bleeding.  Images permanently stored and available for review in PACS.  Impression: Technically successful ultrasound guided injection.  Procedure: Real-time Ultrasound Guidedinjection of therightknee Device: Samsung HS60  Verbal informed consent obtained.  Time-out conducted.  Noted no overlying erythema, induration, or other signs of local infection.  Skin prepped in a sterile fashion.  Local anesthesia: Topical Ethyl chloride.  With sterile technique and under real time ultrasound guidance:Noted trace effusion, 30 mg/2 mL of OrthoVisc (sodium hyaluronate) in a prefilled syringe was injected easily into the knee through a 22-gauge needle.  Completed without difficulty  Advised to call if fevers/chills, erythema, induration, drainage, or persistent bleeding.  Images permanently stored and available for review in PACS.  Impression: Technically successful ultrasound guided injection.  Independent interpretation of notes and tests performed by another provider:   None.  Brief History, Exam, Impression, and Recommendations:    Primary osteoarthritis of both knees Orthovisc injection #3 of 4 both knees, return in 1 week for #4 of 4.    ___________________________________________ Gwen Her. Dianah Field, M.D., ABFM.,  CAQSM. Primary Care and [place] Instructor of [place] of [facility] of Medicine

## 2020-10-28 ENCOUNTER — Ambulatory Visit (INDEPENDENT_AMBULATORY_CARE_PROVIDER_SITE_OTHER): Payer: Managed Care, Other (non HMO)

## 2020-10-28 ENCOUNTER — Other Ambulatory Visit: Payer: Self-pay

## 2020-10-28 ENCOUNTER — Ambulatory Visit: Payer: Managed Care, Other (non HMO) | Admitting: Sports Medicine

## 2020-10-28 DIAGNOSIS — M170 Bilateral primary osteoarthritis of knee: Secondary | ICD-10-CM

## 2020-10-28 NOTE — Progress Notes (Signed)
    Procedures performed today:    Procedure: Real-time Ultrasound Guidedinjection of theleft knee Device: Samsung HS60  Verbal informed consent obtained.  Time-out conducted.  Noted no overlying erythema, induration, or other signs of local infection.  Skin prepped in a sterile fashion.  Local anesthesia: Topical Ethyl chloride.  With sterile technique and under real time ultrasound guidance:Noted trace effusion, 30 mg/2 mL of OrthoVisc (sodium hyaluronate) in a prefilled syringe was injected easily into the knee through a 22-gauge needle.  Completed without difficulty  Advised to call if fevers/chills, erythema, induration, drainage, or persistent bleeding.  Images permanently stored and available for review in PACS.  Impression: Technically successful ultrasound guided injection.  Procedure: Real-time Ultrasound Guidedinjection of therightknee Device: Samsung HS60  Verbal informed consent obtained.  Time-out conducted.  Noted no overlying erythema, induration, or other signs of local infection.  Skin prepped in a sterile fashion.  Local anesthesia: Topical Ethyl chloride.  With sterile technique and under real time ultrasound guidance:Noted trace effusion, 30 mg/2 mL of OrthoVisc (sodium hyaluronate) in a prefilled syringe was injected easily into the knee through a 22-gauge needle.  Completed without difficulty  Advised to call if fevers/chills, erythema, induration, drainage, or persistent bleeding.  Images permanently stored and available for review in PACS.  Impression: Technically successful ultrasound guided injection.  Independent interpretation of notes and tests performed by another provider:   None.  Brief History, Exam, Impression, and Recommendations:    Primary osteoarthritis of both knees Orthovisc 4 of 4 both knees, return as needed. Pain-free now.    ___________________________________________ Gwen Her. Dianah Field, M.D., ABFM.,  CAQSM. Primary Care and [place] Instructor of [place] of [facility] of Medicine

## 2020-10-28 NOTE — Assessment & Plan Note (Addendum)
Orthovisc 4 of 4 both knees, return as needed. Pain-free now.

## 2020-12-12 ENCOUNTER — Encounter: Payer: Managed Care, Other (non HMO) | Admitting: Family Medicine

## 2020-12-19 ENCOUNTER — Encounter: Payer: Self-pay | Admitting: Family Medicine

## 2020-12-19 ENCOUNTER — Ambulatory Visit (INDEPENDENT_AMBULATORY_CARE_PROVIDER_SITE_OTHER): Payer: Managed Care, Other (non HMO) | Admitting: Family Medicine

## 2020-12-19 VITALS — BP 129/75 | HR 63 | Temp 97.9°F | Ht 62.0 in | Wt 151.0 lb

## 2020-12-19 DIAGNOSIS — E782 Mixed hyperlipidemia: Secondary | ICD-10-CM | POA: Diagnosis not present

## 2020-12-19 DIAGNOSIS — E042 Nontoxic multinodular goiter: Secondary | ICD-10-CM | POA: Diagnosis not present

## 2020-12-19 DIAGNOSIS — Z0000 Encounter for general adult medical examination without abnormal findings: Secondary | ICD-10-CM | POA: Diagnosis not present

## 2020-12-19 NOTE — Patient Instructions (Signed)
Preventive Care 57-57 Years Old, Female Preventive care refers to lifestyle choices and visits with your health care provider that can promote health and wellness. This includes: A yearly physical exam. This is also called an annual wellness visit. Regular dental and eye exams. Immunizations. Screening for certain conditions. Healthy lifestyle choices, such as: Eating a healthy diet. Getting regular exercise. Not using drugs or products that contain nicotine and tobacco. Limiting alcohol use. What can I expect for my preventive care visit? Physical exam Your health care provider will check your: Height and weight. These may be used to calculate your BMI (body mass index). BMI is a measurement that tells if you are at a healthy weight. Heart rate and blood pressure. Body temperature. Skin for abnormal spots. Counseling Your health care provider may ask you questions about your: Past medical problems. Family's medical history. Alcohol, tobacco, and drug use. Emotional well-being. Home life and relationship well-being. Sexual activity. Diet, exercise, and sleep habits. Work and work Statistician. Access to firearms. Method of birth control. Menstrual cycle. Pregnancy history. What immunizations do I need?  Vaccines are usually given at various ages, according to a schedule. Your health care provider will recommend vaccines for you based on your age, medicalhistory, and lifestyle or other factors, such as travel or where you work. What tests do I need? Blood tests Lipid and cholesterol levels. These may be checked every 5 years, or more often if you are over 57 years old. Hepatitis C test. Hepatitis B test. Screening Lung cancer screening. You may have this screening every year starting at age 30 if you have a 30-pack-year history of smoking and currently smoke or have quit within the past 15 years. Colorectal cancer screening. All adults should have this screening starting at  age 23 and continuing until age 3. Your health care provider may recommend screening at age 57 if you are at increased risk. You will have tests every 1-10 years, depending on your results and the type of screening test. Diabetes screening. This is done by checking your blood sugar (glucose) after you have not eaten for a while (fasting). You may have this done every 1-3 years. Mammogram. This may be done every 1-2 years. Talk with your health care provider about when you should start having regular mammograms. This may depend on whether you have a family history of breast cancer. BRCA-related cancer screening. This may be done if you have a family history of breast, ovarian, tubal, or peritoneal cancers. Pelvic exam and Pap test. This may be done every 3 years starting at age 57. Starting at age 57, this may be done every 5 years if you have a Pap test in combination with an HPV test. Other tests STD (sexually transmitted disease) testing, if you are at risk. Bone density scan. This is done to screen for osteoporosis. You may have this scan if you are at high risk for osteoporosis. Talk with your health care provider about your test results, treatment options,and if necessary, the need for more tests. Follow these instructions at home: Eating and drinking  Eat a diet that includes fresh fruits and vegetables, whole grains, lean protein, and low-fat dairy products. Take vitamin and mineral supplements as recommended by your health care provider. Do not drink alcohol if: Your health care provider tells you not to drink. You are pregnant, may be pregnant, or are planning to become pregnant. If you drink alcohol: Limit how much you have to 0-1 drink a day. Be aware  of how much alcohol is in your drink. In the [place] one drink equals one 12 oz bottle of beer (355 mL), one 5 oz glass of wine (148 mL), or one 1 oz glass of hard liquor (44 mL).  Lifestyle Take daily care of your teeth and  gums. Brush your teeth every morning and night with fluoride toothpaste. Floss one time each day. Stay active. Exercise for at least 30 minutes 5 or more days each week. Do not use any products that contain nicotine or tobacco, such as cigarettes, e-cigarettes, and chewing tobacco. If you need help quitting, ask your health care provider. Do not use drugs. If you are sexually active, practice safe sex. Use a condom or other form of protection to prevent STIs (sexually transmitted infections). If you do not wish to become pregnant, use a form of birth control. If you plan to become pregnant, see your health care provider for a prepregnancy visit. If told by your health care provider, take low-dose aspirin daily starting at age 57. Find healthy ways to cope with stress, such as: Meditation, yoga, or listening to music. Journaling. Talking to a trusted person. Spending time with friends and family. Safety Always wear your seat belt while driving or riding in a vehicle. Do not drive: If you have been drinking alcohol. Do not ride with someone who has been drinking. When you are tired or distracted. While texting. Wear a helmet and other protective equipment during sports activities. If you have firearms in your house, make sure you follow all gun safety procedures. What's next? Visit your health care provider once a year for an annual wellness visit. Ask your health care provider how often you should have your eyes and teeth checked. Stay up to date on all vaccines. This information is not intended to replace advice given to you by your health care provider. Make sure you discuss any questions you have with your healthcare provider. Document Revised: 03/22/2020 Document Reviewed: 02/27/2018 Elsevier Patient Education  2022 [employer].

## 2020-12-19 NOTE — Assessment & Plan Note (Signed)
Well adult Orders Placed This Encounter  Procedures  . COMPLETE METABOLIC PANEL WITH GFR  . CBC with Differential  . Lipid Panel w/reflex Direct LDL  . TSH  Screening: UTD Immunizations: UTD Anticipatory guidance/Risk factor reduction:  Recommendations per AVS

## 2020-12-19 NOTE — Progress Notes (Signed)
Shawna Johnson - 55 y.o. female MRN [id number]  Date of birth: 1965/12/28  Subjective Chief Complaint  Patient presents with   Annual Exam    HPI Shawna Johnson is a 55 y.o. female here today for annual exam. She has history of HLD and GERD.  She has also seen Dr. Dianah Field recently for knee pain.    She has no new concerns today.   Vaccines are up to date.   She is a non-smoker, quit 2017.  She has about 4 glasses of wine per week.   She does exercise some and follows a healthy diet.    Review of Systems  Constitutional:  Negative for chills, fever, malaise/fatigue and weight loss.  HENT:  Negative for congestion, ear pain and sore throat.   Eyes:  Negative for blurred vision, double vision and pain.  Respiratory:  Negative for cough and shortness of breath.   Cardiovascular:  Negative for chest pain and palpitations.  Gastrointestinal:  Negative for abdominal pain, blood in stool, constipation, heartburn and nausea.  Genitourinary:  Negative for dysuria and urgency.  Musculoskeletal:  Negative for joint pain and myalgias.  Neurological:  Negative for dizziness and headaches.  Endo/Heme/Allergies:  Does not bruise/bleed easily.  Psychiatric/Behavioral:  Negative for depression. The patient is not nervous/anxious and does not have insomnia.    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   Hyperlipidemia    Preventative health care 10/24/2015    Past Surgical History:  Procedure Laterality Date   CERVICAL CONIZATION W/BX  05/2006   COSMETIC SURGERY     eyelid   INNER EAR SURGERY     several childhood   TONSILLECTOMY AND ADENOIDECTOMY      Social History   Socioeconomic History   Marital status: Married    Spouse  name: Not on file   Number of children: 1   Years of education: Not on file   Highest education level: Not on file  Occupational History   Occupation: Stylist  Tobacco Use   Smoking status: Former    Packs/day: 0.50    Years: 30.00    Pack years: 15.00    Types: Cigarettes    Quit date: 07/02/2010    Years since quitting: 10.4   Smokeless tobacco: Former    Quit date: 04/24/2016  Vaping Use   Vaping Use: Never used  Substance and Sexual Activity   Alcohol use: Yes    Alcohol/week: 4.0 standard drinks    Types: 4 Glasses of wine per week    Comment: weekly   Drug use: No   Sexual activity: Yes    Partners: Male    Birth control/protection: Post-menopausal    Comment: lives with husband, no dietary restrictions, works as Emergency planning/management officer, 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  Other Topics Concern   Not on file  Social History Narrative   Not on file   Social Determinants of Health   Financial Resource Strain: Not on file  Food Insecurity: Not on file  Transportation Needs: Not on file  Physical Activity: Not on file  Stress: Not on file  Social Connections: Not on file    Family History  Problem Relation Age of Onset   Heart attack  Father    Hyperlipidemia Father    Hypertension Father    Sudden death Father    Heart disease Father        MI   Alcohol abuse Father    Congestive Heart Failure Sister    COPD Sister    Heart disease Sister        has pacer/defib in place    Cardiomyopathy Sister    Drug abuse Sister        crack in past   Mental illness Maternal Grandfather        suicide   Heart disease Paternal Grandmother    Heart disease Paternal Grandfather    Breast cancer Maternal Aunt    Melanoma Maternal Aunt    Diabetes Maternal Aunt    Colon cancer Neg Hx    Esophageal cancer Neg Hx    Stomach cancer Neg Hx    Rectal cancer Neg Hx     Health Maintenance  Topic Date Due   HIV Screening  12/19/2021 (Originally 01/12/1981)   INFLUENZA VACCINE  01/30/2021    MAMMOGRAM  06/16/2022   PAP SMEAR-Modifier  12/10/2022   COLONOSCOPY (Pts 45-75yrs Insurance coverage will need to be confirmed)  07/20/2024   TETANUS/TDAP  10/23/2025   COVID-19 Vaccine  Completed   Hepatitis C Screening  Completed   Zoster Vaccines- Shingrix  Completed   Pneumococcal Vaccine 74-26 Years old  Aged Out   HPV VACCINES  Aged Out     ----------------------------------------------------------------------------------------------------------------------------------------------------------------------------------------------------------------- Physical Exam BP 129/75 (BP Location: Left Arm, Patient Position: Sitting, Cuff Size: Normal)   Pulse 63   Temp 97.9 F (36.6 C)   Ht 5\' 2"  (1.575 m)   Wt 151 lb (68.5 kg)   LMP 07/02/2013   SpO2 99%   BMI 27.62 kg/m   Physical Exam Constitutional:      General: She is not in acute distress. HENT:     Head: Normocephalic and atraumatic.     Right Ear: Tympanic membrane and ear canal normal.     Left Ear: Tympanic membrane and ear canal normal.     Nose: Nose normal.  Eyes:     General: No scleral icterus.    Conjunctiva/sclera: Conjunctivae normal.  Neck:     Thyroid: No thyromegaly.  Cardiovascular:     Rate and Rhythm: Normal rate and regular rhythm.     Heart sounds: Normal heart sounds.  Pulmonary:     Effort: Pulmonary effort is normal.     Breath sounds: Normal breath sounds.  Abdominal:     General: Bowel sounds are normal. There is no distension.     Palpations: Abdomen is soft.     Tenderness: There is no abdominal tenderness. There is no guarding.  Musculoskeletal:        General: Normal range of motion.     Cervical back: Normal range of motion and neck supple.  Lymphadenopathy:     Cervical: No cervical adenopathy.  Skin:    General: Skin is warm and dry.     Findings: No rash.  Neurological:     General: No focal deficit present.     Mental Status: She is alert and oriented to person, place, and  time.     Cranial Nerves: No cranial nerve deficit.     Coordination: Coordination normal.  Psychiatric:        Mood and Affect: Mood normal.        Behavior: Behavior normal.    ------------------------------------------------------------------------------------------------------------------------------------------------------------------------------------------------------------------- Assessment  and Plan  Well adult exam Well adult Orders Placed This Encounter  Procedures   COMPLETE METABOLIC PANEL WITH GFR   CBC with Differential   Lipid Panel w/reflex Direct LDL   TSH  Screening: UTD Immunizations: UTD Anticipatory guidance/Risk factor reduction:  Recommendations per AVS   No orders of the defined types were placed in this encounter.   No follow-ups on file.    This visit occurred during the SARS-CoV-2 public health emergency.  Safety protocols were in place, including screening questions prior to the visit, additional usage of staff PPE, and extensive cleaning of exam room while observing appropriate contact time as indicated for disinfecting solutions.

## 2020-12-20 LAB — COMPLETE METABOLIC PANEL WITH GFR
AG Ratio: 1.9 (calc) (ref 1.0–2.5)
ALT: 16 U/L (ref 6–29)
AST: 15 U/L (ref 10–35)
Albumin: 4.5 g/dL (ref 3.6–5.1)
Alkaline phosphatase (APISO): 125 U/L (ref 37–153)
BUN: 20 mg/dL (ref 7–25)
CO2: 28 mmol/L (ref 20–32)
Calcium: 9.5 mg/dL (ref 8.6–10.4)
Chloride: 103 mmol/L (ref 98–110)
Creat: 0.86 mg/dL (ref 0.50–1.05)
GFR, Est African American: 88 mL/min/{1.73_m2} (ref >=60)
GFR, Est Non African American: 76 mL/min/{1.73_m2} (ref >=60)
Globulin: 2.4 g/dL (calc) (ref 1.9–3.7)
Glucose, Bld: 93 mg/dL (ref 65–99)
Potassium: 3.9 mmol/L (ref 3.5–5.3)
Sodium: 140 mmol/L (ref 135–146)
Total Bilirubin: 0.5 mg/dL (ref 0.2–1.2)
Total Protein: 6.9 g/dL (ref 6.1–8.1)

## 2020-12-20 LAB — TSH: TSH: 0.91 mIU/L (ref 0.40–4.50)

## 2020-12-20 LAB — CBC WITH DIFFERENTIAL/PLATELET
Absolute Monocytes: 527 cells/uL (ref 200–950)
Basophils Absolute: 41 cells/uL (ref 0–200)
Basophils Relative: 0.5 %
Eosinophils Absolute: 235 cells/uL (ref 15–500)
Eosinophils Relative: 2.9 %
HCT: 44.6 % (ref 35.0–45.0)
Hemoglobin: 14.6 g/dL (ref 11.7–15.5)
Lymphs Abs: 3054 cells/uL (ref 850–3900)
MCH: 29.7 pg (ref 27.0–33.0)
MCHC: 32.7 g/dL (ref 32.0–36.0)
MCV: 90.7 fL (ref 80.0–100.0)
MPV: 9.8 fL (ref 7.5–12.5)
Monocytes Relative: 6.5 %
Neutro Abs: 4244 cells/uL (ref 1500–7800)
Neutrophils Relative %: 52.4 %
Platelets: 332 10*3/uL (ref 140–400)
RBC: 4.92 10*6/uL (ref 3.80–5.10)
RDW: 12.5 % (ref 11.0–15.0)
Total Lymphocyte: 37.7 %
WBC: 8.1 10*3/uL (ref 3.8–10.8)

## 2020-12-20 LAB — LIPID PANEL W/REFLEX DIRECT LDL
Cholesterol: 230 mg/dL — ABNORMAL HIGH (ref <200)
HDL: 66 mg/dL (ref >=50)
LDL Cholesterol (Calc): 139 mg/dL (calc) — ABNORMAL HIGH
Non-HDL Cholesterol (Calc): 164 mg/dL (calc) — ABNORMAL HIGH (ref <130)
Total CHOL/HDL Ratio: 3.5 (calc) (ref <5.0)
Triglycerides: 129 mg/dL (ref <150)

## 2021-02-24 ENCOUNTER — Ambulatory Visit (INDEPENDENT_AMBULATORY_CARE_PROVIDER_SITE_OTHER): Payer: Managed Care, Other (non HMO) | Admitting: Sports Medicine

## 2021-02-24 ENCOUNTER — Other Ambulatory Visit: Payer: Self-pay

## 2021-02-24 VITALS — Temp 98.2°F

## 2021-02-24 DIAGNOSIS — Z111 Encounter for screening for respiratory tuberculosis: Secondary | ICD-10-CM | POA: Diagnosis not present

## 2021-02-24 NOTE — Progress Notes (Signed)
Established Patient Office Visit  Subjective:  Patient ID: Shawna Johnson, female    DOB: 12-22-1965  Age: 55 y.o. MRN: [id number]  CC:  Chief Complaint  Patient presents with   PPD Placement    HPI [name] presents for PPD placement.   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   Hyperlipidemia    Preventative health care 10/24/2015    Past Surgical History:  Procedure Laterality Date   CERVICAL CONIZATION W/BX  05/2006   COSMETIC SURGERY     eyelid   INNER EAR SURGERY     several childhood   TONSILLECTOMY AND ADENOIDECTOMY      Family History  Problem Relation Age of Onset   Heart attack Father    Hyperlipidemia Father    Hypertension Father    Sudden death Father    Heart disease Father        MI   Alcohol abuse Father    Congestive Heart Failure Sister    COPD Sister    Heart disease Sister        has pacer/defib in place    Cardiomyopathy Sister    Drug abuse Sister        crack in past   Mental illness Maternal Grandfather        suicide   Heart disease Paternal Grandmother    Heart disease Paternal Grandfather    Breast cancer Maternal Aunt    Melanoma Maternal Aunt    Diabetes Maternal Aunt    Colon cancer Neg Hx    Esophageal cancer Neg Hx    Stomach cancer Neg Hx    Rectal cancer Neg Hx     Social History   Socioeconomic History   Marital status: Married    Spouse name: Not on file   Number of children: 1   Years of education: Not on file   Highest education level: Not on file  Occupational History   Occupation: Stylist  Tobacco Use   Smoking status: Former    Packs/day: 0.50    Years: 30.00    Pack years: 15.00    Types: Cigarettes    Quit date: 07/02/2010    Years since quitting: 10.6   Smokeless tobacco: Former    Quit date: 04/24/2016   Vaping Use   Vaping Use: Never used  Substance and Sexual Activity   Alcohol use: Yes    Alcohol/week: 4.0 standard drinks    Types: 4 Glasses of wine per week    Comment: weekly   Drug use: No   Sexual activity: Yes    Partners: Male    Birth control/protection: Post-menopausal    Comment: lives with husband, no dietary restrictions, works as Emergency planning/management officer, 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     KRILL OIL PO Take by mouth.     pantoprazole (PROTONIX) 40 MG tablet Take 1 tablet (40 mg total) by mouth 2 (two) times daily. Take 1 tablet 40 mg twice daily for 1 month, then once daily for 2 months. 90 tablet 3   vitamin C (ASCORBIC ACID) 250 MG tablet Take 250 mg by mouth daily.     No facility-administered medications prior to visit.    Allergies  Allergen Reactions   Sulfa Antibiotics Swelling    ROS Review of Systems    Objective:    Physical Exam  Temp 98.2 F (36.8 C) (Oral)   LMP 07/02/2013  Wt Readings from Last 3 Encounters:  12/19/20 151 lb (68.5 kg)  09/08/20 143 lb (64.9 kg)  08/24/20 143 lb (64.9 kg)     Health Maintenance Due  Topic Date Due   INFLUENZA VACCINE  01/30/2021    There are no preventive care reminders to display for this patient.  Lab Results  Component Value Date   TSH 0.91 12/19/2020   Lab Results  Component Value Date   WBC 8.1 12/19/2020    HGB 14.6 12/19/2020   HCT 44.6 12/19/2020   MCV 90.7 12/19/2020   PLT 332 12/19/2020   Lab Results  Component Value Date   NA 140 12/19/2020   K 3.9 12/19/2020   CO2 28 12/19/2020   GLUCOSE 93 12/19/2020   BUN 20 12/19/2020   CREATININE 0.86 12/19/2020   BILITOT 0.5 12/19/2020   ALKPHOS 117 12/10/2019   AST 15 12/19/2020   ALT 16 12/19/2020   PROT 6.9 12/19/2020   ALBUMIN 4.5 12/10/2019   CALCIUM 9.5 12/19/2020   GFR 78.75 12/10/2019   Lab Results  Component Value Date   CHOL 230 (H) 12/19/2020   Lab Results  Component Value Date   HDL 66 12/19/2020   Lab Results  Component Value Date   LDLCALC 139 (H) 12/19/2020   Lab Results  Component Value Date   TRIG 129 12/19/2020   Lab Results  Component Value Date   CHOLHDL 3.5 12/19/2020   No results found for: HGBA1C    Assessment & Plan:  PPD - Patient tolerated injection well without complications. Patient advised to schedule next injection 3 days from today.     Problem List Items Addressed This Visit   None Visit Diagnoses     Screening-pulmonary TB    -  Primary   Relevant Orders   PPD (Completed)       No orders of the defined types were placed in this encounter.   Follow-up: Return in about 3 days (around 02/27/2021) for PPD read. Durene Romans, Monico Blitz, [place]

## 2021-02-27 ENCOUNTER — Ambulatory Visit (INDEPENDENT_AMBULATORY_CARE_PROVIDER_SITE_OTHER): Payer: Managed Care, Other (non HMO) | Admitting: Family Medicine

## 2021-02-27 ENCOUNTER — Other Ambulatory Visit: Payer: Self-pay

## 2021-02-27 ENCOUNTER — Encounter: Payer: Self-pay | Admitting: Family Medicine

## 2021-02-27 VITALS — BP 128/71 | HR 65

## 2021-02-27 DIAGNOSIS — Z111 Encounter for screening for respiratory tuberculosis: Secondary | ICD-10-CM

## 2021-02-27 LAB — TB SKIN TEST
Induration: 0 mm
TB Skin Test: NEGATIVE

## 2021-02-27 NOTE — Progress Notes (Signed)
Established Patient Office Visit  Subjective:  Patient ID: Shawna Johnson, female    DOB: [birth year]  Age: 55 y.o. MRN: [id number]  CC:  Chief Complaint  Patient presents with   PPD Reading    HPI [name] presents for PPD reading.   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   Hyperlipidemia    Preventative health care 10/24/2015    Past Surgical History:  Procedure Laterality Date   CERVICAL CONIZATION W/BX  05/2006   COSMETIC SURGERY     eyelid   INNER EAR SURGERY     several childhood   TONSILLECTOMY AND ADENOIDECTOMY      Family History  Problem Relation Age of Onset   Heart attack Father    Hyperlipidemia Father    Hypertension Father    Sudden death Father    Heart disease Father        MI   Alcohol abuse Father    Congestive Heart Failure Sister    COPD Sister    Heart disease Sister        has pacer/defib in place    Cardiomyopathy Sister    Drug abuse Sister        crack in past   Mental illness Maternal Grandfather        suicide   Heart disease Paternal Grandmother    Heart disease Paternal Grandfather    Breast cancer Maternal Aunt    Melanoma Maternal Aunt    Diabetes Maternal Aunt    Colon cancer Neg Hx    Esophageal cancer Neg Hx    Stomach cancer Neg Hx    Rectal cancer Neg Hx     Social History   Socioeconomic History   Marital status: Married    Spouse name: Not on file   Number of children: 1   Years of education: Not on file   Highest education level: Not on file  Occupational History   Occupation: Stylist  Tobacco Use   Smoking status: Former    Packs/day: 0.50    Years: 30.00    Pack years: 15.00    Types: Cigarettes    Quit date: 07/02/2010    Years since quitting: 10.6   Smokeless tobacco: Former    Quit date: 04/24/2016   Vaping Use   Vaping Use: Never used  Substance and Sexual Activity   Alcohol use: Yes    Alcohol/week: 4.0 standard drinks    Types: 4 Glasses of wine per week    Comment: weekly   Drug use: No   Sexual activity: Yes    Partners: Male    Birth control/protection: Post-menopausal    Comment: lives with husband, no dietary restrictions, works as Emergency planning/management officer, 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     KRILL OIL PO Take by mouth.     pantoprazole (PROTONIX) 40 MG tablet Take 1 tablet (40 mg total) by mouth 2 (two) times daily. Take 1 tablet 40 mg twice daily for 1 month, then once daily for 2 months. 90 tablet 3   vitamin C (ASCORBIC ACID) 250 MG tablet Take 250 mg by mouth daily.     No facility-administered medications prior to visit.    Allergies  Allergen Reactions   Sulfa Antibiotics Swelling    ROS Review of Systems    Objective:    Physical Exam  BP 128/71   Pulse 65   LMP 07/02/2013   SpO2 99%  Wt Readings from Last 3 Encounters:  12/19/20 151 lb (68.5 kg)  09/08/20 143 lb (64.9 kg)  08/24/20 143 lb (64.9 kg)     Health Maintenance Due  Topic Date Due   INFLUENZA VACCINE  01/30/2021    There are no preventive care reminders to display for this patient.  Lab Results  Component Value Date   TSH 0.91 12/19/2020   Lab Results  Component Value Date   WBC 8.1  12/19/2020   HGB 14.6 12/19/2020   HCT 44.6 12/19/2020   MCV 90.7 12/19/2020   PLT 332 12/19/2020   Lab Results  Component Value Date   NA 140 12/19/2020   K 3.9 12/19/2020   CO2 28 12/19/2020   GLUCOSE 93 12/19/2020   BUN 20 12/19/2020   CREATININE 0.86 12/19/2020   BILITOT 0.5 12/19/2020   ALKPHOS 117 12/10/2019   AST 15 12/19/2020   ALT 16 12/19/2020   PROT 6.9 12/19/2020   ALBUMIN 4.5 12/10/2019   CALCIUM 9.5 12/19/2020   GFR 78.75 12/10/2019   Lab Results  Component Value Date   CHOL 230 (H) 12/19/2020   Lab Results  Component Value Date   HDL 66 12/19/2020   Lab Results  Component Value Date   LDLCALC 139 (H) 12/19/2020   Lab Results  Component Value Date   TRIG 129 12/19/2020   Lab Results  Component Value Date   CHOLHDL 3.5 12/19/2020   No results found for: HGBA1C    Assessment & Plan:  TB screening - Negative PPD with 0 mm.    Problem List Items Addressed This Visit   None Visit Diagnoses     Screening-pulmonary TB    -  Primary       No orders of the defined types were placed in this encounter.   Follow-up: No follow-ups on file.    Lavell Luster, [place]

## 2021-02-27 NOTE — Progress Notes (Signed)
Medical screening examination/treatment was performed by qualified clinical staff member and as supervising physician I was immediately available for consultation/collaboration. I have reviewed documentation and agree with assessment and plan.  Tanesia Butner, DO  

## 2021-04-05 ENCOUNTER — Other Ambulatory Visit: Payer: Self-pay | Admitting: Family Medicine

## 2021-05-09 ENCOUNTER — Other Ambulatory Visit: Payer: Self-pay | Admitting: Family Medicine

## 2021-05-09 ENCOUNTER — Telehealth: Payer: Self-pay | Admitting: Internal Medicine

## 2021-05-09 DIAGNOSIS — Z1231 Encounter for screening mammogram for malignant neoplasm of breast: Secondary | ICD-10-CM

## 2021-05-09 MED ORDER — PANTOPRAZOLE SODIUM 40 MG PO TBEC: 40.0000 mg | DELAYED_RELEASE_TABLET | Freq: Two times a day (BID) | ORAL | 0 refills | Status: DC

## 2021-05-09 NOTE — Telephone Encounter (Signed)
Patient is requesting a refill on Pantoprazole she also scheduled an OV for 07/25/21. Please call her to advise if she would be able to receive the refill or not.

## 2021-05-09 NOTE — Telephone Encounter (Signed)
Called patient to inform med sent and to keep scheduled appointment with Dr Hilarie Fredrickson

## 2021-05-22 ENCOUNTER — Encounter: Payer: Self-pay | Admitting: Family Medicine

## 2021-05-22 ENCOUNTER — Telehealth (INDEPENDENT_AMBULATORY_CARE_PROVIDER_SITE_OTHER): Payer: Managed Care, Other (non HMO) | Admitting: Family Medicine

## 2021-05-22 DIAGNOSIS — U071 COVID-19: Secondary | ICD-10-CM | POA: Diagnosis not present

## 2021-05-22 MED ORDER — NIRMATRELVIR/RITONAVIR (PAXLOVID)TABLET: 3.0000 | ORAL_TABLET | Freq: Two times a day (BID) | ORAL | 0 refills | Status: AC

## 2021-05-22 NOTE — Assessment & Plan Note (Signed)
Recommend continued supportive care with increased fluids and rest.  We discussed adding Paxlovid which she would like to add.  Instructed to contact clinic if symptoms are significantly worsening.  Discussed quarantine precautions.

## 2021-05-22 NOTE — Progress Notes (Signed)
Shawna Johnson - 55 y.o. female MRN [id number]  Date of birth: 03/15/1966   This visit type was conducted due to national recommendations for restrictions regarding the COVID-19 Pandemic (e.g. social distancing).  This format is felt to be most appropriate for this patient at this time.  All issues noted in this document were discussed and addressed.  No physical exam was performed (except for noted visual exam findings with Video Visits).  I discussed the limitations of evaluation and management by telemedicine and the availability of in person appointments. The patient expressed understanding and agreed to proceed.  I connected withNAME@ on 05/22/21 at  1:10 PM EST by a video enabled telemedicine application and verified that I am speaking with the correct person using two identifiers.  Present at visit: Luetta Nutting, DO Shawna Johnson   Patient Location: [address] [place] [id number]   Provider location:   [place]  Chief Complaint  Patient presents with   Covid Positive    HPI  Shawna Johnson is a 55 y.o. female who presents via Engineer, civil (consulting) for a telehealth visit today.  She reports that she recently tested positive for COVID.  Symptoms started approximately 3 days ago.  Current symptoms are mild congestion.  She has not had fever, chills or lower respiratory symptoms.  She did recently receive her COVID booster.   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   Hyperlipidemia    Preventative health care 10/24/2015    Past Surgical History:  Procedure Laterality Date   CERVICAL CONIZATION W/BX  05/2006   COSMETIC SURGERY     eyelid   INNER EAR SURGERY     several childhood    TONSILLECTOMY AND ADENOIDECTOMY      Family History  Problem Relation Age of Onset   Heart attack Father    Hyperlipidemia Father    Hypertension Father    Sudden death Father    Heart disease Father        MI   Alcohol abuse Father    Congestive Heart Failure Sister    COPD Sister    Heart disease Sister        has pacer/defib in place    Cardiomyopathy Sister    Drug abuse Sister        crack in past   Mental illness Maternal Grandfather        suicide   Heart disease Paternal Grandmother    Heart disease Paternal Grandfather    Breast cancer Maternal Aunt    Melanoma Maternal Aunt    Diabetes Maternal Aunt    Colon cancer Neg Hx    Esophageal cancer Neg Hx    Stomach cancer Neg Hx    Rectal cancer Neg Hx     Social History   Socioeconomic History   Marital status: Married    Spouse name: Not on file   Number of children: 1   Years of education: Not on file   Highest education level: Not on file  Occupational History   Occupation: Stylist  Tobacco Use   Smoking status: Former    Packs/day: 0.50    Years: 30.00    Pack years:  15.00    Types: Cigarettes    Quit date: 07/02/2010    Years since quitting: 10.8   Smokeless tobacco: Former    Quit date: 04/24/2016  Vaping Use   Vaping Use: Never used  Substance and Sexual Activity   Alcohol use: Yes    Alcohol/week: 4.0 standard drinks    Types: 4 Glasses of wine per week    Comment: weekly   Drug use: No   Sexual activity: Yes    Partners: Male    Birth control/protection: Post-menopausal    Comment: lives with husband, no dietary restrictions, works as Emergency planning/management officer, 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  Other Topics Concern   Not on file  Social History Narrative   Not on file   Social Determinants of Health   Financial Resource Strain: Not on file  Food Insecurity: Not on file  Transportation Needs: Not on file  Physical Activity: Not on file  Stress: Not on file  Social Connections: Not on file  Intimate  Partner Violence: Not on file     Current Outpatient Medications:    atorvastatin (LIPITOR) 10 MG tablet, TAKE ONE-HALF (1/2) TABLET BY MOUTH DAILY, Disp: 45 tablet, Rfl: 3   calcium carbonate (OS-CAL) 1250 (500 Ca) MG chewable tablet, Chew 2 tablets by mouth daily., Disp: , Rfl:    KRILL OIL PO, Take by mouth., Disp: , Rfl:    nirmatrelvir/ritonavir EUA (PAXLOVID) 20 x 150 MG & 10 x 100MG  TABS, Take 3 tablets by mouth 2 (two) times daily for 5 days. (Take nirmatrelvir 150 mg two tablets twice daily for 5 days and ritonavir 100 mg one tablet twice daily for 5 days) Patient GFR is 76, Disp: 30 tablet, Rfl: 0   pantoprazole (PROTONIX) 40 MG tablet, Take 1 tablet (40 mg total) by mouth 2 (two) times daily. Take 1 tablet 40 mg twice daily for 1 month, then once daily for 2 months., Disp: 180 tablet, Rfl: 0   vitamin C (ASCORBIC ACID) 250 MG tablet, Take 250 mg by mouth daily., Disp: , Rfl:   EXAM:  VITALS per patient if applicable: BP 035/00   Pulse 69   Temp (!) 97.4 F (36.3 C)   Ht 5\' 2"  (1.575 m)   Wt 151 lb (68.5 kg)   LMP 07/02/2013   SpO2 97%   BMI 27.62 kg/m   GENERAL: alert, oriented, appears well and in no acute distress  HEENT: atraumatic, conjunttiva clear, no obvious abnormalities on inspection of external nose and ears  NECK: normal movements of the head and neck  LUNGS: on inspection no signs of respiratory distress, breathing rate appears normal, no obvious gross SOB, gasping or wheezing  CV: no obvious cyanosis  MS: moves all visible extremities without noticeable abnormality  PSYCH/NEURO: pleasant and cooperative, no obvious depression or anxiety, speech and thought processing grossly intact  ASSESSMENT AND PLAN:  Discussed the following assessment and plan:  COVID-19 Recommend continued supportive care with increased fluids and rest.  We discussed adding Paxlovid which she would like to add.  Instructed to contact clinic if symptoms are significantly  worsening.  Discussed quarantine precautions.     I discussed the assessment and treatment plan with the patient. The patient was provided an opportunity to ask questions and all were answered. The patient agreed with the plan and demonstrated an understanding of the instructions.   The patient was advised to call back or seek an in-person evaluation if the symptoms worsen or if the condition  fails to improve as anticipated.    Luetta Nutting, DO  -year-old

## 2021-05-22 NOTE — Progress Notes (Signed)
Symptoms started: Friday, 11/18 COVID + = home test  Current symptoms: stuffy nose  Medications: Sudafed, Tylenol  Pt to provide vital signs during visit.

## 2021-06-21 ENCOUNTER — Other Ambulatory Visit: Payer: Self-pay

## 2021-06-21 ENCOUNTER — Ambulatory Visit (INDEPENDENT_AMBULATORY_CARE_PROVIDER_SITE_OTHER): Payer: Managed Care, Other (non HMO)

## 2021-06-21 DIAGNOSIS — Z1231 Encounter for screening mammogram for malignant neoplasm of breast: Secondary | ICD-10-CM

## 2021-06-22 ENCOUNTER — Other Ambulatory Visit: Payer: Self-pay | Admitting: Family Medicine

## 2021-06-22 DIAGNOSIS — R928 Other abnormal and inconclusive findings on diagnostic imaging of breast: Secondary | ICD-10-CM

## 2021-06-26 ENCOUNTER — Encounter: Payer: Self-pay | Admitting: Family Medicine

## 2021-06-27 NOTE — Telephone Encounter (Signed)
All the CMAs covering for Panya are indeed supposed to be checking her phone for any voicemails left.  I myself worked with you Friday morning and their were no voicemails on the phone when I arrived at her desk nor when I left it at 12 noon.  I did contact [facility] and they are unable to accommodate the pt any earlier than 07/25/2021 and are actually booking out until February 2nd now.  Sending to you for review of MyChart message sent to pt and in response to your message to triage.  Charyl Bigger, CMA

## 2021-06-27 NOTE — Telephone Encounter (Signed)
Message from patient was never relayed to me.  Are CMA's covering Shawna Johnson checking voicemails?  Can we check on getting this moved up to an earlier date for her and contact her to let her know if we can or can't?  Thanks!

## 2021-07-10 ENCOUNTER — Ambulatory Visit
Admission: RE | Admit: 2021-07-10 | Discharge: 2021-07-10 | Disposition: A | Discharge status: Home / Self Care | Payer: Managed Care, Other (non HMO) | Source: Ambulatory Visit | Attending: Family Medicine | Admitting: Family Medicine

## 2021-07-10 DIAGNOSIS — R928 Other abnormal and inconclusive findings on diagnostic imaging of breast: Secondary | ICD-10-CM

## 2021-07-13 ENCOUNTER — Other Ambulatory Visit: Payer: Self-pay

## 2021-07-13 ENCOUNTER — Ambulatory Visit (INDEPENDENT_AMBULATORY_CARE_PROVIDER_SITE_OTHER): Payer: Managed Care, Other (non HMO)

## 2021-07-13 ENCOUNTER — Ambulatory Visit: Payer: Managed Care, Other (non HMO) | Admitting: Sports Medicine

## 2021-07-13 DIAGNOSIS — M778 Other enthesopathies, not elsewhere classified: Secondary | ICD-10-CM

## 2021-07-13 DIAGNOSIS — M25811 Other specified joint disorders, right shoulder: Secondary | ICD-10-CM | POA: Diagnosis not present

## 2021-07-13 DIAGNOSIS — M25511 Pain in right shoulder: Secondary | ICD-10-CM | POA: Diagnosis not present

## 2021-07-13 DIAGNOSIS — M7581 Other shoulder lesions, right shoulder: Secondary | ICD-10-CM | POA: Insufficient documentation

## 2021-07-13 NOTE — Assessment & Plan Note (Signed)
This is a very pleasant 56 year old female, for some time now she is had pain in her right shoulder, localized posterior aspect of radiation down the back of the shoulder, worse with pushing forward such as with vacuuming. On exam she has good motion, good strength but we are able to reproduce the pain with the liftoff sign, suspicious for rotator cuff tendinitis specifically subscapularis. She will do x-rays, rotator cuff conditioning, return to see me as needed.

## 2021-07-13 NOTE — Progress Notes (Addendum)
° ° °  Procedures performed today:    None.  Independent interpretation of notes and tests performed by another provider:   X-rays personally reviewed, there is a cortical irregularity inferior scapula near the glenoid.  Needs MRI.  Brief History, Exam, Impression, and Recommendations:    Subscapularis tendinitis, right This is a very pleasant 56 year old female, for some time now she is had pain in her right shoulder, localized posterior aspect of radiation down the back of the shoulder, worse with pushing forward such as with vacuuming. On exam she has good motion, good strength but we are able to reproduce the pain with the liftoff sign, suspicious for rotator cuff tendinitis specifically subscapularis. She will do x-rays, rotator cuff conditioning, return to see me as needed.  Mass of joint of right shoulder Incidentally noted potential mass inferior scapula near the glenoid with cortical irregularities. Adding MRI with contrast.    ___________________________________________ Gwen Her. Dianah Field, M.D., ABFM., CAQSM. Primary Care and [place] Instructor of [place] [facility]

## 2021-07-14 DIAGNOSIS — M25811 Other specified joint disorders, right shoulder: Secondary | ICD-10-CM | POA: Insufficient documentation

## 2021-07-14 NOTE — Addendum Note (Signed)
Addended by: Silverio Decamp on: 07/14/2021 09:33 AM   Modules accepted: Orders, Level of Service

## 2021-07-14 NOTE — Assessment & Plan Note (Signed)
Incidentally noted potential mass inferior scapula near the glenoid with cortical irregularities. Adding MRI with contrast.

## 2021-07-24 ENCOUNTER — Other Ambulatory Visit: Payer: Self-pay

## 2021-07-24 ENCOUNTER — Ambulatory Visit (INDEPENDENT_AMBULATORY_CARE_PROVIDER_SITE_OTHER): Payer: Managed Care, Other (non HMO)

## 2021-07-24 ENCOUNTER — Other Ambulatory Visit: Payer: Managed Care, Other (non HMO)

## 2021-07-24 DIAGNOSIS — D499 Neoplasm of unspecified behavior of unspecified site: Secondary | ICD-10-CM

## 2021-07-24 DIAGNOSIS — M25811 Other specified joint disorders, right shoulder: Secondary | ICD-10-CM | POA: Diagnosis not present

## 2021-07-24 DIAGNOSIS — R937 Abnormal findings on diagnostic imaging of other parts of musculoskeletal system: Secondary | ICD-10-CM

## 2021-07-24 DIAGNOSIS — M25511 Pain in right shoulder: Secondary | ICD-10-CM

## 2021-07-24 MED ORDER — GADOBUTROL 1 MMOL/ML IV SOLN
7.0000 mL | Freq: Once | INTRAVENOUS | Status: AC | PRN
  Administered 2021-07-24: 7 mL via INTRAVENOUS

## 2021-07-25 ENCOUNTER — Other Ambulatory Visit: Payer: Managed Care, Other (non HMO)

## 2021-07-25 ENCOUNTER — Ambulatory Visit (INDEPENDENT_AMBULATORY_CARE_PROVIDER_SITE_OTHER)
Admission: RE | Admit: 2021-07-25 | Discharge: 2021-07-25 | Disposition: A | Discharge status: Home / Self Care | Payer: Managed Care, Other (non HMO) | Source: Ambulatory Visit | Attending: Internal Medicine | Admitting: Internal Medicine

## 2021-07-25 ENCOUNTER — Ambulatory Visit: Payer: Managed Care, Other (non HMO) | Admitting: Internal Medicine

## 2021-07-25 ENCOUNTER — Encounter: Payer: Self-pay | Admitting: Internal Medicine

## 2021-07-25 VITALS — BP 120/70 | HR 70 | Ht 62.0 in | Wt 154.0 lb

## 2021-07-25 DIAGNOSIS — Z79899 Other long term (current) drug therapy: Secondary | ICD-10-CM

## 2021-07-25 DIAGNOSIS — K2100 Gastro-esophageal reflux disease with esophagitis, without bleeding: Secondary | ICD-10-CM | POA: Diagnosis not present

## 2021-07-25 MED ORDER — PANTOPRAZOLE SODIUM 40 MG PO TBEC: 40.0000 mg | DELAYED_RELEASE_TABLET | Freq: Every day | ORAL | 1 refills | Status: DC

## 2021-07-25 NOTE — Progress Notes (Signed)
° °  Subjective:    Patient ID: Shawna Johnson, female    DOB: 1965-07-10, 56 y.o.   MRN: [id number]  HPI Shawna Johnson is a 56 year old female with history of GERD with esophagitis with associated dysphagia, history of PUD, who is here for follow-up.  She was last seen in February 2022.  She is here alone today.  She is been maintained on pantoprazole 40 mg daily.  She reports that her acid reflux and heartburn are well controlled.  On very few occasions in the last year she has had breakthrough heartburn which is woken her from sleep.  This she has figured out is related to if she drinks wine close to bedtime.  Thus she has avoided this with relief of nocturnal symptoms.  No dysphagia or odynophagia.  No abdominal pain.  Regular bowel movements.   Review of Systems As per HPI, otherwise negative  Current Medications, Allergies, Past Medical History, Past Surgical History, Family History and Social History were reviewed in [employer] record.    Objective:   Physical Exam BP 120/70    Pulse 70    Ht 5\' 2"  (1.575 m)    Wt 154 lb (69.9 kg)    LMP 07/02/2013    SpO2 98%    BMI 28.17 kg/m  Gen: awake, alert, NAD HEENT: anicteric Neuro: nonfocal  CMP     Component Value Date/Time   NA 140 12/19/2020 0000   K 3.9 12/19/2020 0000   CL 103 12/19/2020 0000   CO2 28 12/19/2020 0000   GLUCOSE 93 12/19/2020 0000   BUN 20 12/19/2020 0000   CREATININE 0.86 12/19/2020 0000   CALCIUM 9.5 12/19/2020 0000   PROT 6.9 12/19/2020 0000   ALBUMIN 4.5 12/10/2019 1128   AST 15 12/19/2020 0000   ALT 16 12/19/2020 0000   ALKPHOS 117 12/10/2019 1128   BILITOT 0.5 12/19/2020 0000   GFRNONAA 76 12/19/2020 0000   GFRAA 88 12/19/2020 0000       Assessment & Plan:  56 year old female with history of GERD with esophagitis with associated dysphagia, history of PUD, who is here for follow-up.  GERD with history of esophagitis and dysphagia --responsive to PPI and esophageal dilation  at EGD just over 1 year ago.  Symptoms have not recurred with daily pantoprazole.  Minor and infrequent breakthrough pyrosis symptom at night which she has tied alcohol use.  We spent time today reviewing the risks versus benefits of chronic PPI therapy.  We reviewed bone health together and she has not yet had a bone mineral density. --Continue pantoprazole 40 mg daily --Over-the-counter liquid Gaviscon can be used for breakthrough symptoms on an occasional basis --Bone mineral density test ordered  2.  CRC screening --recommended January 2026  20 minutes total spent today including patient facing time, coordination of care, reviewing medical history/procedures/pertinent radiology studies, and documentation of the encounter.

## 2021-07-25 NOTE — Patient Instructions (Addendum)
We have sent the following medications to your pharmacy for you to pick up at your convenience: Pantoprazole   Please purchase the following medications over the counter and take as directed: Gaviscon as needed  You have been scheduled for a bone density test on Thursday 07/27/21 at 9:00 am. Please arrive 15 minutes prior to your scheduled appointment to radiology on the basement floor of [place] location for this test. If you need to cancel or reschedule for any reason, please contact radiology at [phone].  Preparation for test is as follows:  If you are taking calcium, discontinue this 24-48 hours prior to your appointment.  Wear pants with an elastic waistband (or without any metal such as a zipper).  Do not wear an underwire bra.  We do have gowns if you are unable to find appropriate clothing without metal.  Please bring a list of all current medications.  If you are age 40 or older, your body mass index should be between 23-30. Your Body mass index is 28.17 kg/m. If this is out of the aforementioned range listed, please consider follow up with your Primary Care Provider.  If you are age 68 or younger, your body mass index should be between 19-25. Your Body mass index is 28.17 kg/m. If this is out of the aformentioned range listed, please consider follow up with your Primary Care Provider.   ________________________________________________________  The [facility] providers would like to encourage you to use [facility] to communicate with providers for non-urgent requests or questions.  Due to long hold times on the telephone, sending your provider a message by [facility] may be a faster and more efficient way to get a response.  Please allow 48 business hours for a response.  Please remember that this is for non-urgent requests.  _______________________________________________________  Due to recent changes in healthcare laws, you may see the results of your imaging  and laboratory studies on MyChart before your provider has had a chance to review them.  We understand that in some cases there may be results that are confusing or concerning to you. Not all laboratory results come back in the same time frame and the provider may be waiting for multiple results in order to interpret others.  Please give [place] 48 hours in order for your provider to thoroughly review all the results before contacting the office for clarification of your results.

## 2021-07-27 ENCOUNTER — Other Ambulatory Visit: Payer: Managed Care, Other (non HMO)

## 2021-08-02 ENCOUNTER — Other Ambulatory Visit: Payer: Managed Care, Other (non HMO)

## 2021-09-05 ENCOUNTER — Encounter: Payer: Self-pay | Admitting: Family Medicine

## 2021-09-05 ENCOUNTER — Ambulatory Visit (INDEPENDENT_AMBULATORY_CARE_PROVIDER_SITE_OTHER): Payer: Managed Care, Other (non HMO) | Admitting: Family Medicine

## 2021-09-05 VITALS — Ht 62.0 in | Wt 154.0 lb

## 2021-09-05 DIAGNOSIS — M170 Bilateral primary osteoarthritis of knee: Secondary | ICD-10-CM | POA: Diagnosis not present

## 2021-09-05 DIAGNOSIS — M7752 Other enthesopathy of left foot: Secondary | ICD-10-CM

## 2021-09-05 NOTE — Assessment & Plan Note (Signed)
Acutely occurring.  Having pain at the second MTP joint. ?-Counseled on supportive care. ?-Orthotics today. ?-Added toe padding today. ?

## 2021-09-05 NOTE — Progress Notes (Signed)
?  Shawna Johnson - 56 y.o. female MRN [id number]  Date of birth: 1965/12/23 ? ?SUBJECTIVE:  Including CC & ROS.  ?No chief complaint on file. ? ? ?Shawna Johnson is a 56 y.o. female that is presenting with acute on chronic knee pain as well as left toe pain.  She has had steroid and gel injections in the knees with minimal improvement.  She notices the pain on a daily basis. ? ? ? ?Review of Systems ?See HPI  ? ?HISTORY: Past Medical, Surgical, Social, and Family History Reviewed & Updated per EMR.   ?Pertinent Historical Findings include: ? ?Past Medical History:  ?Diagnosis Date  ? Abnormal alkaline phosphatase test 04/24/2016  ? Arthritis   ? Elevated alkaline phosphatase level   ? Esophageal stenosis   ? Fatty liver   ? Gastric ulcer   ? 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 Hyperlipidemia   ? Preventative health care 10/24/2015  ? ? ?Past Surgical History:  ?Procedure Laterality Date  ? CERVICAL CONIZATION W/BX  05/2006  ? COSMETIC SURGERY    ? eyelid  ? INNER EAR SURGERY    ? several childhood  ? TONSILLECTOMY AND ADENOIDECTOMY    ? ? ? ?PHYSICAL EXAM:  ?VS: Ht '5\' 2"'$  (1.575 m)   Wt 154 lb (69.9 kg)   LMP 07/02/2013   BMI 28.17 kg/m?  ?Physical Exam ?Gen: NAD, alert, cooperative with exam, well-appearing ?MSK:  ?Neurovascularly intact   ? ?Patient was fitted for a standard, cushioned, semi-rigid orthotic. ?The orthotic was heated and afterward the patient stood on the orthotic blank positioned on the orthotic stand. ?The patient was positioned in subtalar neutral position and 10 degrees of ankle dorsiflexion in a weight bearing stance. ?After completion of molding, a stable base was applied to the orthotic blank. ?The blank was ground to a stable position for weight bearing. ?Size: 36M ?Pairs: 2 ?Base: [facility] EVA ?Additional Posting and Padding: None ?The patient ambulated these, and they were very comfortable. ? ? ?ASSESSMENT & PLAN:  ? ?Primary osteoarthritis of both knees ?Acute on chronic in nature.  Reported  degenerative changes.  Has failed normal steroid injection and gel injection. ?-Counseled on home exercise therapy and supportive care. ?-Pursue Zilretta injections. ?-Could consider genicular nerve block. ? ?Adhesive capsulitis of toe, left ?Acutely occurring.  Having pain at the second MTP joint. ?-Counseled on supportive care. ?-Orthotics today. ?-Added toe padding today. ? ? ? ? ?

## 2021-09-05 NOTE — Assessment & Plan Note (Signed)
Acute on chronic in nature.  Reported degenerative changes.  Has failed normal steroid injection and gel injection. ?-Counseled on home exercise therapy and supportive care. ?-Pursue Zilretta injections. ?-Could consider genicular nerve block. ?

## 2021-09-06 ENCOUNTER — Telehealth: Payer: Self-pay | Admitting: Family Medicine

## 2021-09-06 NOTE — Telephone Encounter (Signed)
Answered questions.  ? ?Rosemarie Ax, MD ?[facility] ?09/06/2021, 9:43 AM ? ?

## 2021-09-13 ENCOUNTER — Encounter: Payer: Self-pay | Admitting: Family Medicine

## 2021-09-13 ENCOUNTER — Ambulatory Visit: Payer: Self-pay

## 2021-09-13 ENCOUNTER — Ambulatory Visit: Payer: Managed Care, Other (non HMO) | Admitting: Family Medicine

## 2021-09-13 VITALS — BP 140/80 | Ht 62.0 in | Wt 154.0 lb

## 2021-09-13 DIAGNOSIS — M170 Bilateral primary osteoarthritis of knee: Secondary | ICD-10-CM

## 2021-09-13 NOTE — Progress Notes (Signed)
?  Shawna Johnson - 56 y.o. female MRN [id number]  Date of birth: Jan 14, 1966 ? ?SUBJECTIVE:  Including CC & ROS.  ?No chief complaint on file. ? ? ?Shawna Johnson is a 56 y.o. female that is here for bilateral Zilretta injections. ? ? ?Review of Systems ?See HPI  ? ?HISTORY: Past Medical, Surgical, Social, and Family History Reviewed & Updated per EMR.   ?Pertinent Historical Findings include: ? ?Past Medical History:  ?Diagnosis Date  ? Abnormal alkaline phosphatase test 04/24/2016  ? Arthritis   ? Elevated alkaline phosphatase level   ? Esophageal stenosis   ? Fatty liver   ? Gastric ulcer   ? 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 Hyperlipidemia   ? Preventative health care 10/24/2015  ? ? ?Past Surgical History:  ?Procedure Laterality Date  ? CERVICAL CONIZATION W/BX  05/2006  ? COSMETIC SURGERY    ? eyelid  ? INNER EAR SURGERY    ? several childhood  ? TONSILLECTOMY AND ADENOIDECTOMY    ? ? ? ?PHYSICAL EXAM:  ?VS: BP 140/80 (BP Location: Left Arm, Patient Position: Sitting)   Ht '5\' 2"'$  (1.575 m)   Wt 154 lb (69.9 kg)   LMP 07/02/2013   BMI 28.17 kg/m?  ?Physical Exam ?Gen: NAD, alert, cooperative with exam, well-appearing ?MSK:  ?Neurovascularly intact   ? ? ?Aspiration/Injection Procedure Note ?Shawna Johnson ?1965/08/05 ? ?Procedure: Injection ?Indications: right knee pain ? ?Procedure Details ?Consent: Risks of procedure as well as the alternatives and risks of each were explained to the (patient/caregiver).  Consent for procedure obtained. ?Time Out: Verified patient identification, verified procedure, site/side was marked, verified correct patient position, special equipment/implants available, medications/allergies/relevent history reviewed, required imaging and test results available.  Performed.  The area was cleaned with iodine and alcohol swabs.   ? ?The right knee superior lateral suprapatellar pouch was injected using 3 cc of 1% lidocaine on a 22-gauge 1-1/2 inch needle.  The syringe was switched and a  mixture containing 5 cc's of 32 mg Zilretta and 4 cc's of 0.25% bupivacaine was injected.  Ultrasound was used. Images were obtained in long views showing the injection.  ? ? ?A sterile dressing was applied. ? ?Patient did tolerate procedure well. ? ?Aspiration/Injection Procedure Note ?Shawna Johnson ?1965/07/29 ? ?Procedure: Injection ?Indications: left knee pain ? ?Procedure Details ?Consent: Risks of procedure as well as the alternatives and risks of each were explained to the (patient/caregiver).  Consent for procedure obtained. ?Time Out: Verified patient identification, verified procedure, site/side was marked, verified correct patient position, special equipment/implants available, medications/allergies/relevent history reviewed, required imaging and test results available.  Performed.  The area was cleaned with iodine and alcohol swabs.   ? ?The left knee superior lateral suprapatellar pouch was injected using 3 cc of 1% lidocaine on a 22-gauge 1-1/2 inch needle.  The syringe was switched and a mixture containing 5 cc's of 32 mg Zilretta and 4 cc's of 0.25% bupivacaine was injected.  Ultrasound was used. Images were obtained in long views showing the injection.  ? ? ?A sterile dressing was applied. ? ?Patient did tolerate procedure well. ? ?  ? ? ?ASSESSMENT & PLAN:  ? ?Primary osteoarthritis of both knees ?Mild effusion appreciated on the each knee.  Completed bilateral Zilretta injections today. ? ? ? ? ?

## 2021-09-13 NOTE — Patient Instructions (Signed)
Good to see you ?Please use ice as needed  ?Please try the exercises   ?Please send me a message in MyChart with any questions or updates.  ?Please see me back in 4-6 weeks.  ? ?--Dr. Elaisha Zahniser ? ?

## 2021-09-13 NOTE — Assessment & Plan Note (Signed)
Mild effusion appreciated on the each knee.  Completed bilateral Zilretta injections today. ?

## 2021-10-24 ENCOUNTER — Other Ambulatory Visit: Payer: Self-pay

## 2021-10-24 MED ORDER — ATORVASTATIN CALCIUM 10 MG PO TABS: ORAL_TABLET | ORAL | 0 refills | Status: DC

## 2021-10-24 NOTE — Progress Notes (Signed)
Patient has been scheduled for August 14th, 2023. ? ?Patient wanted Atorvastatin sent to [facility] on [address] instead of [facility]. AMUCK ?

## 2021-10-24 NOTE — Progress Notes (Unsigned)
Pls contact the patient to schedule appt in August. Sending 90 day med refill. Thanks ?

## 2021-10-25 ENCOUNTER — Encounter: Payer: Self-pay | Admitting: Family Medicine

## 2021-12-19 ENCOUNTER — Telehealth: Payer: Self-pay | Admitting: Family Medicine

## 2021-12-22 ENCOUNTER — Encounter: Payer: Self-pay | Admitting: Sports Medicine

## 2021-12-25 ENCOUNTER — Other Ambulatory Visit (HOSPITAL_BASED_OUTPATIENT_CLINIC_OR_DEPARTMENT_OTHER): Payer: Self-pay

## 2021-12-25 ENCOUNTER — Ambulatory Visit: Payer: Managed Care, Other (non HMO) | Admitting: Family Medicine

## 2021-12-25 LAB — HM PAP SMEAR: HPV, high-risk: NEGATIVE

## 2021-12-26 NOTE — Telephone Encounter (Signed)
PA form and clinicals faxed to [facility].

## 2021-12-28 ENCOUNTER — Encounter: Payer: Self-pay | Admitting: Family Medicine

## 2021-12-28 ENCOUNTER — Ambulatory Visit: Payer: Managed Care, Other (non HMO) | Admitting: Family Medicine

## 2021-12-28 VITALS — BP 146/78 | HR 80 | Temp 97.8°F | Ht 62.0 in | Wt 152.0 lb

## 2021-12-28 DIAGNOSIS — Z7689 Persons encountering health services in other specified circumstances: Secondary | ICD-10-CM | POA: Diagnosis not present

## 2021-12-28 DIAGNOSIS — R030 Elevated blood-pressure reading, without diagnosis of hypertension: Secondary | ICD-10-CM

## 2021-12-28 NOTE — Progress Notes (Signed)
New Patient Office Visit  Subjective    Patient ID: Shawna Johnson, female    DOB: Jul 08, 1965  Age: 56 y.o. MRN: [id number]  CC:  Chief Complaint  Patient presents with   Establish Care    Would like to discuss BP, states it was 162/104 at doctors office but when she went home right after it was 124/79    HPI Shawna Johnson presents to establish care.   OB/GYN- [facility]  Orthopedist   She was at her OB/GYN office and had an elevated BP reading. She does not have a hx of HTN.   No other concerns.   Denies fever, chills, dizziness, chest pain, palpitations, shortness of breath, abdominal pain, N/V/D, urinary symptoms, LE edema.   Former smoker. Stopped 8 years ago.  Drinks 1 glass of wine 3-4 days per week.      Outpatient Encounter Medications as of 12/28/2021  Medication Sig   atorvastatin (LIPITOR) 10 MG tablet Take 1 tablet ('10mg'$ ) daily.   calcium carbonate (OS-CAL) 1250 (500 Ca) MG chewable tablet Chew 2 tablets by mouth daily.   KRILL OIL PO Take by mouth.   pantoprazole (PROTONIX) 40 MG tablet Take 1 tablet (40 mg total) by mouth daily.   vitamin C (ASCORBIC ACID) 250 MG tablet Take 250 mg by mouth daily.   No facility-administered encounter medications on file as of 12/28/2021.    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   Hyperlipidemia    Preventative health care 10/24/2015    Past Surgical History:  Procedure Laterality Date   CERVICAL CONIZATION W/BX  05/2006   COSMETIC SURGERY     eyelid   INNER EAR SURGERY     several childhood   TONSILLECTOMY AND ADENOIDECTOMY      Family History  Problem Relation Age of Onset   Arthritis Mother    High Cholesterol Mother    High blood pressure Mother    Heart attack Father    Hyperlipidemia Father     Hypertension Father    Sudden death Father    Heart disease Father        MI   Alcohol abuse Father    COPD Father    Early death Father    Congestive Heart Failure Sister    COPD Sister    Heart disease Sister        has pacer/defib in place    Cardiomyopathy Sister    Drug abuse Sister        crack in past   High Cholesterol Sister    High blood pressure Sister    Kidney disease Sister    Learning disabilities Sister    Mental illness Maternal Grandfather        suicide   Depression Maternal Grandfather    Heart disease Paternal Grandmother    High Cholesterol Paternal Grandmother    Heart disease Paternal Grandfather    High Cholesterol Paternal Grandfather    Breast cancer Maternal Aunt    Melanoma Maternal Aunt    Diabetes Maternal Aunt    Colon cancer Neg Hx    Esophageal cancer Neg Hx    Stomach cancer Neg Hx    Rectal cancer Neg Hx     Social History   Socioeconomic History  Marital status: Married    Spouse name: Not on file   Number of children: 1   Years of education: Not on file   Highest education level: Not on file  Occupational History   Occupation: Stylist  Tobacco Use   Smoking status: Former    Packs/day: 0.50    Years: 30.00    Total pack years: 15.00    Types: Cigarettes    Quit date: 07/02/2010    Years since quitting: 11.5   Smokeless tobacco: Former    Quit date: 04/24/2016  Vaping Use   Vaping Use: Never used  Substance and Sexual Activity   Alcohol use: Yes    Alcohol/week: 4.0 standard drinks of alcohol    Types: 4 Glasses of wine per week    Comment: weekly   Drug use: No   Sexual activity: Yes    Partners: Male    Birth control/protection: Post-menopausal    Comment: lives with husband, no dietary restrictions, works as Emergency planning/management officer, 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      Objective    BP (!) 146/78 (BP Location: Right Arm, Patient Position: Sitting, Cuff Size: Large)   Pulse 80   Temp 97.8 F (36.6 C) (Temporal)   Ht '5\' 2"'$  (1.575 m)   Wt 152 lb (68.9 kg)   LMP 07/02/2013   SpO2 97%   BMI 27.80 kg/m   Physical Exam  Alert and in no distress.  Cardiac exam shows a regular rate and rhythm without murmurs or gallops. Lungs are clear to auscultation.      Assessment & Plan:   Problem List Items Addressed This Visit   None Visit Diagnoses     Elevated blood-pressure reading without diagnosis of hypertension    -  Primary   Encounter to establish care          BP much lower than at OB/GYN office. No hx of HTN. She will check her BP at home and follow up in 4-6 weeks. DASH diet handout given. Low salt diet, exercise.   Return in about 5 weeks (around 02/01/2022) for for BP.   Harland Dingwall, NP-C

## 2021-12-28 NOTE — Patient Instructions (Signed)
Goal BP is <130/80   Check your BP at home over the next 4 weeks.   Bring in your BP machine and readings    DASH Eating Plan DASH stands for Dietary Approaches to Stop Hypertension. The DASH eating plan is a healthy eating plan that has been shown to: Reduce high blood pressure (hypertension). Reduce your risk for type 2 diabetes, heart disease, and stroke. Help with weight loss. What are tips for following this plan? Reading food labels Check food labels for the amount of salt (sodium) per serving. Choose foods with less than 5 percent of the Daily Value of sodium. Generally, foods with less than 300 milligrams (mg) of sodium per serving fit into this eating plan. To find whole grains, look for the word "whole" as the first word in the ingredient list. Shopping Buy products labeled as "low-sodium" or "no salt added." Buy fresh foods. Avoid canned foods and pre-made or frozen meals. Cooking Avoid adding salt when cooking. Use salt-free seasonings or herbs instead of table salt or sea salt. Check with your health care provider or pharmacist before using salt substitutes. Do not fry foods. Cook foods using healthy methods such as baking, boiling, grilling, roasting, and broiling instead. Cook with heart-healthy oils, such as olive, canola, avocado, soybean, or sunflower oil. Meal planning  Eat a balanced diet that includes: 4 or more servings of fruits and 4 or more servings of vegetables each day. Try to fill one-half of your plate with fruits and vegetables. 6-8 servings of whole grains each day. Less than 6 oz (170 g) of lean meat, poultry, or fish each day. A 3-oz (85-g) serving of meat is about the same size as a deck of cards. One egg equals 1 oz (28 g). 2-3 servings of low-fat dairy each day. One serving is 1 cup (237 mL). 1 serving of nuts, seeds, or beans 5 times each week. 2-3 servings of heart-healthy fats. Healthy fats called omega-3 fatty acids are found in foods such as  walnuts, flaxseeds, fortified milks, and eggs. These fats are also found in cold-water fish, such as sardines, salmon, and mackerel. Limit how much you eat of: Canned or prepackaged foods. Food that is high in trans fat, such as some fried foods. Food that is high in saturated fat, such as fatty meat. Desserts and other sweets, sugary drinks, and other foods with added sugar. Full-fat dairy products. Do not salt foods before eating. Do not eat more than 4 egg yolks a week. Try to eat at least 2 vegetarian meals a week. Eat more home-cooked food and less restaurant, buffet, and fast food. Lifestyle When eating at a restaurant, ask that your food be prepared with less salt or no salt, if possible. If you drink alcohol: Limit how much you use to: 0-1 drink a day for women who are not pregnant. 0-2 drinks a day for men. Be aware of how much alcohol is in your drink. In the [place] one drink equals one 12 oz bottle of beer (355 mL), one 5 oz glass of wine (148 mL), or one 1 oz glass of hard liquor (44 mL). General information Avoid eating more than 2,300 mg of salt a day. If you have hypertension, you may need to reduce your sodium intake to 1,500 mg a day. Work with your health care provider to maintain a healthy body weight or to lose weight. Ask what an ideal weight is for you. Get at least 30 minutes of exercise that causes  your heart to beat faster (aerobic exercise) most days of the week. Activities may include walking, swimming, or biking. Work with your health care provider or dietitian to adjust your eating plan to your individual calorie needs. What foods should I eat? Fruits All fresh, dried, or frozen fruit. Canned fruit in natural juice (without added sugar). Vegetables Fresh or frozen vegetables (raw, steamed, roasted, or grilled). Low-sodium or reduced-sodium tomato and vegetable juice. Low-sodium or reduced-sodium tomato sauce and tomato paste. Low-sodium or reduced-sodium canned  vegetables. Grains Whole-grain or whole-wheat bread. Whole-grain or whole-wheat pasta. Brown rice. Shawna Johnson. Bulgur. Whole-grain and low-sodium cereals. Pita bread. Low-fat, low-sodium crackers. Whole-wheat flour tortillas. Meats and other proteins Skinless chicken or [place]. Ground chicken or [place]. Pork with fat trimmed off. Fish and seafood. Egg whites. Dried beans, peas, or lentils. Unsalted nuts, nut butters, and seeds. Unsalted canned beans. Lean cuts of beef with fat trimmed off. Low-sodium, lean precooked or cured meat, such as sausages or meat loaves. Dairy Low-fat (1%) or fat-free (skim) milk. Reduced-fat, low-fat, or fat-free cheeses. Nonfat, low-sodium ricotta or cottage cheese. Low-fat or nonfat yogurt. Low-fat, low-sodium cheese. Fats and oils Soft margarine without trans fats. Vegetable oil. Reduced-fat, low-fat, or light mayonnaise and salad dressings (reduced-sodium). Canola, safflower, olive, avocado, soybean, and sunflower oils. Avocado. Seasonings and condiments Herbs. Spices. Seasoning mixes without salt. Other foods Unsalted popcorn and pretzels. Fat-free sweets. The items listed above may not be a complete list of foods and beverages you can eat. Contact a dietitian for more information. What foods should I avoid? Fruits Canned fruit in a light or heavy syrup. Fried fruit. Fruit in cream or butter sauce. Vegetables Creamed or fried vegetables. Vegetables in a cheese sauce. Regular canned vegetables (not low-sodium or reduced-sodium). Regular canned tomato sauce and paste (not low-sodium or reduced-sodium). Regular tomato and vegetable juice (not low-sodium or reduced-sodium). Shawna Johnson. Olives. Grains Baked goods made with fat, such as croissants, muffins, or some breads. Dry pasta or rice meal packs. Meats and other proteins Fatty cuts of meat. Ribs. Fried meat. Shawna Johnson. Bologna, salami, and other precooked or cured meats, such as sausages or meat loaves. Fat from  the back of a pig (fatback). Bratwurst. Salted nuts and seeds. Canned beans with added salt. Canned or smoked fish. Whole eggs or egg yolks. Chicken or [place] with skin. Dairy Whole or 2% milk, cream, and half-and-half. Whole or full-fat cream cheese. Whole-fat or sweetened yogurt. Full-fat cheese. Nondairy creamers. Whipped toppings. Processed cheese and cheese spreads. Fats and oils Butter. Stick margarine. Lard. Shortening. Ghee. Bacon fat. Tropical oils, such as coconut, palm kernel, or palm oil. Seasonings and condiments Onion salt, garlic salt, seasoned salt, table salt, and sea salt. Worcestershire sauce. Tartar sauce. Barbecue sauce. Teriyaki sauce. Soy sauce, including reduced-sodium. Steak sauce. Canned and packaged gravies. Fish sauce. Oyster sauce. Cocktail sauce. Store-bought horseradish. Ketchup. Mustard. Meat flavorings and tenderizers. Bouillon cubes. Hot sauces. Pre-made or packaged marinades. Pre-made or packaged taco seasonings. Relishes. Regular salad dressings. Other foods Salted popcorn and pretzels. The items listed above may not be a complete list of foods and beverages you should avoid. Contact a dietitian for more information. Where to find more information National Heart, Lung, and Blood Institute: https://wilson-eaton.com/ American Heart Association: www.heart.org Academy of Nutrition and Dietetics: www.eatright.[place]: www.kidney.org Summary The DASH eating plan is a healthy eating plan that has been shown to reduce high blood pressure (hypertension). It may also reduce your risk for type 2 diabetes, heart disease,  and stroke. When on the DASH eating plan, aim to eat more fresh fruits and vegetables, whole grains, lean proteins, low-fat dairy, and heart-healthy fats. With the DASH eating plan, you should limit salt (sodium) intake to 2,300 mg a day. If you have hypertension, you may need to reduce your sodium intake to 1,500 mg a day. Work with your  health care provider or dietitian to adjust your eating plan to your individual calorie needs. This information is not intended to replace advice given to you by your health care provider. Make sure you discuss any questions you have with your health care provider. Document Revised: 05/22/2019 Document Reviewed: 05/22/2019 Elsevier Patient Education  [place].

## 2022-01-04 ENCOUNTER — Ambulatory Visit: Payer: Managed Care, Other (non HMO) | Admitting: Family Medicine

## 2022-01-05 NOTE — Telephone Encounter (Signed)
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 She cancelled her appt with Dr. Raeford Razor and will follow up with Dr. Georgina Snell.

## 2022-01-08 NOTE — Progress Notes (Signed)
Subjective:    CC: Knee and shoulder pain  I, Molly Weber, LAT, ATC, am serving as scribe for Dr. Clementeen Graham.  HPI: Pt is a 56 y/o female presenting w/ c/o knee and shoulder pain. Patient has done PT, gel injection, cortisone injections, and zilretta and zilretta worked the best gel injections did not work. Patient also having issues with left thumb comes and goes mostly later in the day.  Knee pain: Hx of knee OA. Locates pain to front and back of knee -Swelling: yes  -Mechanical symptoms: yes -Aggravating factors: jumping up and down steps  -Treatments tried: B knee Zilretta injections on 09/13/21; prior steroid and gel injections  R Shoulder pain: arthritis locates pain to top of shoulders and sometimes pain in the back of arm  -Radiating pain: yes to upper arm and up the neck -Mechanical symptoms: no -Aggravating factors: Lifting weights -Treatments tried:  Voltaren gel for shoulder and neck She notes her pain is only mild at times.   Diagnostic testing: R shoulder MRI- 07/24/21; R shoulder XR- 07/13/21; R and L knee XR- 07/21/20  Pertinent review of Systems: No fevers or chills  Relevant historical information: Thyroid nodules.  Upcoming trip to [place] in November.   Objective:    Vitals:   01/09/22 1349  BP: 140/90  Pulse: 86  SpO2: 98%   General: Well Developed, well nourished, and in no acute distress.   MSK: Right shoulder: Normal.  Normal motion.  Intact strength.  Right knee normal.  Normal motion with crepitation. Left knee: Normal.  Normal motion with crepitation.  Lab and Radiology Results  EXAM: MRI OF THE RIGHT SHOULDER WITHOUT AND WITH CONTRAST   TECHNIQUE: Multiplanar, multisequence MR imaging of the right shoulder was performed before and after the administration of intravenous contrast.   CONTRAST:  7mL GADAVIST GADOBUTROL 1 MMOL/ML IV SOLN   COMPARISON:  Right shoulder radiographs 07/13/2021   FINDINGS: Rotator cuff: There is a  punctate horizontal linear partial-thickness tear within the anterior supraspinatus tendon footprint measuring up to 6 mm in AP dimension and 4 mm in transverse dimension (coronal image 15 and sagittal image 20). This involves midsubstance of the inserting fibers. Mild underlying supraspinatus tendinosis. No tendon retraction. Mild-to-moderate anterior infraspinatus tendinosis. The subscapularis and teres minor are intact.   Muscles: No rotator cuff muscle atrophy, fatty infiltration, or edema.   Biceps long head: The intra-articular long head of the biceps tendon is intact.   Acromioclavicular Joint: There are mild degenerative changes of the acromioclavicular joint including joint space narrowing, subchondral marrow edema, and peripheral osteophytosis. Type II acromion. No subacromial/subdeltoid bursitis.   Glenohumeral Joint: Moderate thinning of the inferior glenoid and humeral head cartilage.   Labrum: Grossly intact, but evaluation is limited by lack of intraarticular fluid.   Bones: On recent radiographs there is apparent cortical erosion of the anterior inferior aspect of the glenoid. In this region there is teardrop-shaped 2.3 by 1.9 by 1.5 cm (transverse by AP by craniocaudal) decreased T1 and increased T2 signal bordering the bone where there is mild erosion. The fluid appears to taper medial along the anterior inferior aspect of the glenoid from the deep aspect of the inferior subscapularis musculature. There is peripheral enhancement around the periphery and mild enhancing septations within this fluid collection. No definite enhancing soft tissue mass is seen. The fluid may be from a chronic ganglion. Cannot exclude the presence of infection within this fluid. There is also diffuse high-grade partial to full-thickness  cartilage loss throughout the superior greater than inferior aspects of the glenoid with diffuse moderate to high-grade inferior glenoid  degenerative subchondral cystic change.   Other: None.   IMPRESSION: 1. In the region of the cortical erosion seen within the anterior inferior glenoid on 07/13/2021 radiographs, there is peripherally enhancing multi-septated fluid measuring up to 2.3 cm bordering the region of bone erosion. No definite solid mass is seen to indicate malignancy/tumor. The bone erosion may be secondary to chronic pressure from this possible ganglion cystic fluid, however cannot exclude infection within this fluid contributing to the cortical erosion. Recommend clinical correlation. Consider orthopedic consult. 2. Moderate inferior glenohumeral osteoarthritis. 3. Punctate partial-thickness tear within the anterior supraspinatus tendon footprint. 4. Mild degenerative changes of the acromioclavicular joint.     Electronically Signed   By: Neita Garnet M.D.   On: 07/24/2021 12:55 I, Clementeen Graham, personally (independently) visualized and performed the interpretation of the images attached in this note.   EXAM: RIGHT KNEE - COMPLETE 4+ VIEW   COMPARISON:  None.   FINDINGS: Standing frontal, standing tunnel, standing lateral, and sunrise patellar images were obtained. There is no fracture or dislocation. No joint effusion. There is moderate joint space narrowing medially. No other appreciable joint space narrowing there is mild spurring in all compartments. No erosive change.   IMPRESSION: Osteoarthritic change, most notably in the patellofemoral joint. There is spurring in all compartments, however. No fracture, dislocation, or joint effusion.     Electronically Signed   By: Bretta Bang III M.D.   On: 07/21/2020 14:56   EXAM: LEFT KNEE - COMPLETE 4+ VIEW   COMPARISON:  None.   FINDINGS: Standing frontal, standing tunnel, lateral, and sunrise patellar images were obtained. There is no fracture or dislocation. No joint effusion. There is moderate narrowing of the patellofemoral  joint. There is slight narrowing medially. There is spurring in all compartments. No erosive change.   IMPRESSION: Osteoarthritic change, most notably in the patellofemoral joint. Spurring noted in all compartments, however. No fracture, dislocation, or joint effusion.     Electronically Signed   By: Bretta Bang III M.D.   On: 07/21/2020 14:55  I, Clementeen Graham, personally (independently) visualized and performed the interpretation of the images attached in this note.   Impression and Recommendations:    Assessment and Plan: 56 y.o. female with bilateral knee pain due to exacerbation of DJD.  Work on authorization for [employer] injections today.  She had good results with that in the past.  Hopefully we can do the injections soon and then again in 3 months prior before her trip to [place]. She is currently also working on weight loss and quad strengthening which is a good strategy as well for long-term control for knee pain.  Her right shoulder pain is a little more challenging.  She does not have a lot of chronic shoulder pain with only occasional intermittent pain.  However she did have an MRI in January 2023 that showed a ganglion cyst that appear to be causing a bony erosion at the inferior portion of the glenoid in the setting of glenohumeral DJD.  My concern is I am worried that if left unchecked this ganglion cyst may erode into the glenoid and make a future total shoulder replacement challenging or impossible.  I wonder if we should do something more proactive about the ganglion cyst now such as trying to drain it or even remove it surgically.  I would like her to have a consultation  with an orthopedic surgeon to help answer these questions.  Refer to Dr. Steward Drone. Marland Kitchen  PDMP not reviewed this encounter. Orders Placed This Encounter  Procedures   Ambulatory referral to Orthopedic Surgery    Referral Priority:   Routine    Referral Type:   Surgical    Referral Reason:   Specialty  Services Required    Referred to Provider:   Huel Cote, MD    Requested Specialty:   Orthopedic Surgery    Number of Visits Requested:   1   No orders of the defined types were placed in this encounter.   Discussed warning signs or symptoms. Please see discharge instructions. Patient expresses understanding.   The above documentation has been reviewed and is accurate and complete Clementeen Graham, M.D.

## 2022-01-09 ENCOUNTER — Ambulatory Visit (INDEPENDENT_AMBULATORY_CARE_PROVIDER_SITE_OTHER): Payer: Managed Care, Other (non HMO) | Admitting: Family Medicine

## 2022-01-09 VITALS — BP 140/90 | HR 86 | Ht 62.0 in | Wt 150.0 lb

## 2022-01-09 DIAGNOSIS — M6740 Ganglion, unspecified site: Secondary | ICD-10-CM | POA: Diagnosis not present

## 2022-01-09 DIAGNOSIS — G8929 Other chronic pain: Secondary | ICD-10-CM

## 2022-01-09 DIAGNOSIS — M170 Bilateral primary osteoarthritis of knee: Secondary | ICD-10-CM

## 2022-01-09 DIAGNOSIS — M25561 Pain in right knee: Secondary | ICD-10-CM

## 2022-01-09 DIAGNOSIS — M25511 Pain in right shoulder: Secondary | ICD-10-CM

## 2022-01-09 DIAGNOSIS — M25562 Pain in left knee: Secondary | ICD-10-CM

## 2022-01-09 NOTE — Patient Instructions (Addendum)
Good to see you   We will try to get the zilreatta approved.   Follow up as needed.

## 2022-01-10 ENCOUNTER — Ambulatory Visit (HOSPITAL_BASED_OUTPATIENT_CLINIC_OR_DEPARTMENT_OTHER): Payer: Managed Care, Other (non HMO) | Admitting: Orthopaedic Surgery

## 2022-01-10 DIAGNOSIS — M24811 Other specific joint derangements of right shoulder, not elsewhere classified: Secondary | ICD-10-CM

## 2022-01-10 NOTE — Progress Notes (Signed)
Chief Complaint: Right shoulder evaluation     History of Present Illness:    Shawna Johnson is a 56 y.o. female right-hand-dominant female presents for right shoulder evaluation per Dr. Georgina Snell.  She is here today for a known cyst involving the inferior glenoid.  She had an MRI that was done in January 2023 that revealed this.  Overall she states that she does not really have a longstanding history of shoulder pain.  She does endorse some triceps pain although this is overall mild and not as bothersome to her as her knees.  She denies any recent history of shoulder dislocation or instability.  She does endorse a very distant history as a child of a shoulder dislocation that required formal reduction in the emergency room.  Since that time she has not really had any recurrences of this or pain.  She does use Voltaren gel which helps.    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   Hyperlipidemia    Preventative health care 10/24/2015   Past Surgical History:  Procedure Laterality Date   CERVICAL CONIZATION W/BX  05/2006   COSMETIC SURGERY     eyelid   INNER EAR SURGERY     several childhood   TONSILLECTOMY AND ADENOIDECTOMY     Social History   Socioeconomic History   Marital status: Married    Spouse name: Not on file   Number of children: 1   Years of education: Not on file   Highest education level: Not on file  Occupational History   Occupation: Stylist  Tobacco Use   Smoking status: Former    Packs/day: 0.50    Years: 30.00    Total pack years: 15.00    Types: Cigarettes    Quit date: 07/02/2010    Years since quitting: 11.5   Smokeless tobacco: Former    Quit date:  04/24/2016  Vaping Use   Vaping Use: Never used  Substance and Sexual Activity   Alcohol use: Yes    Alcohol/week: 4.0 standard drinks of alcohol    Types: 4 Glasses of wine per week    Comment: weekly   Drug use: No   Sexual activity: Yes    Partners: Male    Birth control/protection: Post-menopausal    Comment: lives with husband, no dietary restrictions, works as Emergency planning/management officer, 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  Other Topics Concern   Not on file  Social History Narrative   Not on file   Social Determinants of Health   Financial Resource Strain: Not on file  Food Insecurity: Not on file  Transportation Needs: Not on file  Physical Activity: Not on file  Stress: Not on file  Social Connections: Not on file   Family History  Problem Relation Age of Onset   Arthritis Mother    High Cholesterol Mother    High blood pressure Mother    Heart attack Father    Hyperlipidemia Father    Hypertension Father    Sudden death Father    Heart disease Father  MI   Alcohol abuse Father    COPD Father    Early death Father    Congestive Heart Failure Sister    COPD Sister    Heart disease Sister        has pacer/defib in place    Cardiomyopathy Sister    Drug abuse Sister        crack in past   High Cholesterol Sister    High blood pressure Sister    Kidney disease Sister    Learning disabilities Sister    Mental illness Maternal Grandfather        suicide   Depression Maternal Grandfather    Heart disease Paternal Grandmother    High Cholesterol Paternal Grandmother    Heart disease Paternal Grandfather    High Cholesterol Paternal Grandfather    Breast cancer Maternal Aunt    Melanoma Maternal Aunt    Diabetes Maternal Aunt    Colon cancer Neg Hx    Esophageal cancer Neg Hx    Stomach cancer Neg Hx    Rectal cancer Neg Hx    Allergies  Allergen Reactions   Misc. Sulfonamide Containing Compounds Swelling   Sulfa Antibiotics Swelling   Current Outpatient  Medications  Medication Sig Dispense Refill   atorvastatin (LIPITOR) 10 MG tablet Take 1 tablet ('10mg'$ ) daily. 30 tablet 0   calcium carbonate (OS-CAL) 1250 (500 Ca) MG chewable tablet Chew 2 tablets by mouth daily.     KRILL OIL PO Take by mouth.     pantoprazole (PROTONIX) 40 MG tablet Take 1 tablet (40 mg total) by mouth daily. 90 tablet 1   vitamin C (ASCORBIC ACID) 250 MG tablet Take 250 mg by mouth daily.     No current facility-administered medications for this visit.   No results found.  Review of Systems:   A ROS was performed including pertinent positives and negatives as documented in the HPI.  Physical Exam :   Constitutional: NAD and appears stated age Neurological: Alert and oriented Psych: Appropriate affect and cooperative Last menstrual period 07/02/2013.   Comprehensive Musculoskeletal Exam:    Musculoskeletal Exam    Inspection Right Left  Skin No atrophy or winging No atrophy or winging  Palpation    Tenderness Posterior triceps None  Range of Motion    Flexion (passive) 170 170  Flexion (active) 170 170  Abduction 170 170  ER at the side 70 70  Can reach behind back to T12 T12  Strength     Full Full  Special Tests    Pseudoparalytic No No  Neurologic    Fires PIN, radial, median, ulnar, musculocutaneous, axillary, suprascapular, long thoracic, and spinal accessory innervated muscles. No abnormal sensibility  Vascular/Lymphatic    Radial Pulse 2+ 2+  Cervical Exam    Patient has symmetric cervical range of motion with negative Spurling's test.  Special Test: There is some clicking with a O'Brien's test although this is not painful for     Imaging:   Xray (3 views right shoulder): There is a component of anterior glenoid bone loss  MRI (right shoulder): She has a inferior labral injury with a cyst involving the inferior portion of the glenoid.  I personally reviewed and interpreted the radiographs.   Assessment:   57 y.o. female  right-hand-dominant female with a right inferior glenoid cyst.  I described that I do believe that this is likely result of a known labral tear that likely occurred when she was young.  That  being said she did not have any recurrences of instability so it is likely that she remained asymptomatic.  Overall I do believe that this bone cyst as result of that and for her has largely been asymptomatic.  To me there does not appear to be any features of malignancy that would overall progress.  I do not overall believe that this would be an expansile mass.  To that effect given the fact that she is relatively asymptomatic I would not recommend any intervention at this time other than clinical observation.  We did discuss that should the shoulder become more symptomatic she may ultimately benefit from some additional intervention.  I do believe that given the fact that she does have inferior labral injury it would be more likely for her to develop focal glenohumeral osteoarthritis involving the joint although overall her cartilage does remain white well-appearing at that effect I do not necessarily believe that any acute intervention would be necessary.  Plan :    -I will plan to see her back as needed     I personally saw and evaluated the patient, and participated in the management and treatment plan.  Vanetta Mulders, MD Attending Physician, Orthopedic Surgery  This document was dictated using Dragon voice recognition software. A reasonable attempt at proof reading has been made to minimize errors.

## 2022-01-11 ENCOUNTER — Other Ambulatory Visit: Payer: Self-pay | Admitting: Family Medicine

## 2022-01-19 ENCOUNTER — Telehealth: Payer: Self-pay | Admitting: Family Medicine

## 2022-01-19 NOTE — Telephone Encounter (Signed)
Patient called to follow up on Zilretta approval?  Can you help?  (She said that due to an upcoming trip, she would like the injection next week)

## 2022-01-22 ENCOUNTER — Ambulatory Visit: Payer: Managed Care, Other (non HMO) | Admitting: Family Medicine

## 2022-01-22 ENCOUNTER — Ambulatory Visit: Payer: Self-pay

## 2022-01-22 DIAGNOSIS — M170 Bilateral primary osteoarthritis of knee: Secondary | ICD-10-CM

## 2022-01-22 DIAGNOSIS — M25561 Pain in right knee: Secondary | ICD-10-CM | POA: Diagnosis not present

## 2022-01-22 DIAGNOSIS — G8929 Other chronic pain: Secondary | ICD-10-CM | POA: Diagnosis not present

## 2022-01-22 DIAGNOSIS — M25562 Pain in left knee: Secondary | ICD-10-CM

## 2022-01-22 MED ORDER — TRIAMCINOLONE ACETONIDE 32 MG IX SRER
32.0000 mg | Freq: Once | INTRA_ARTICULAR | Status: AC
  Administered 2022-01-22: 32 mg via INTRA_ARTICULAR

## 2022-01-22 NOTE — Telephone Encounter (Signed)
Insurance denied bilateral zilretta. Pt made aware & would like to move forward with getting the injections.

## 2022-01-22 NOTE — Telephone Encounter (Signed)
Patient went ahead and scheduled for this afternoon. She said that she has secondary insurance that she can file with if anything isnt covered by [place].

## 2022-01-22 NOTE — Patient Instructions (Addendum)
Thank you for coming in today.   You received an injection today. Seek immediate medical attention if the joint becomes red, extremely painful, or is oozing fluid.   Check back as needed 

## 2022-01-22 NOTE — Progress Notes (Signed)
Shawna Johnson presents to clinic today for bilateral knee Zilretta injections  Procedure: Real-time Ultrasound Guided Injection of right knee superior lateral patellar space Device: Philips Affiniti 50G Images permanently stored and available for review in PACS Verbal informed consent obtained.  Discussed risks and benefits of procedure. Warned about infection, bleeding, hyperglycemia damage to structures among others. Patient expresses understanding and agreement Time-out conducted.   Noted no overlying erythema, induration, or other signs of local infection.   Skin prepped in a sterile fashion.   Local anesthesia: Topical Ethyl chloride.   With sterile technique and under real time ultrasound guidance: Zilretta 32 mg injected into right knee joint. Fluid seen entering the joint capsule.   Completed without difficulty   Advised to call if fevers/chills, erythema, induration, drainage, or persistent bleeding.   Images permanently stored and available for review in the ultrasound unit.  Impression: Technically successful ultrasound guided injection.    Procedure: Real-time Ultrasound Guided Injection of left knee superior lateral patellar space Device: Philips Affiniti 50G Images permanently stored and available for review in PACS Verbal informed consent obtained.  Discussed risks and benefits of procedure. Warned about infection, bleeding, hyperglycemia damage to structures among others. Patient expresses understanding and agreement Time-out conducted.   Noted no overlying erythema, induration, or other signs of local infection.   Skin prepped in a sterile fashion.   Local anesthesia: Topical Ethyl chloride.   With sterile technique and under real time ultrasound guidance: Zilretta 32 mg injected into left knee joint. Fluid seen entering the joint capsule.   Completed without difficulty   Advised to call if fevers/chills, erythema, induration, drainage, or persistent bleeding.   Images  permanently stored and available for review in the ultrasound unit.  Impression: Technically successful ultrasound guided injection.  Lot number: [id number] for both injections

## 2022-02-01 ENCOUNTER — Ambulatory Visit: Payer: Managed Care, Other (non HMO) | Admitting: Family Medicine

## 2022-02-01 ENCOUNTER — Encounter: Payer: Self-pay | Admitting: Family Medicine

## 2022-02-01 VITALS — BP 136/82 | HR 75 | Temp 97.6°F | Ht 62.0 in | Wt 136.0 lb

## 2022-02-01 DIAGNOSIS — R748 Abnormal levels of other serum enzymes: Secondary | ICD-10-CM

## 2022-02-01 DIAGNOSIS — E7800 Pure hypercholesterolemia, unspecified: Secondary | ICD-10-CM

## 2022-02-01 DIAGNOSIS — R030 Elevated blood-pressure reading, without diagnosis of hypertension: Secondary | ICD-10-CM

## 2022-02-01 DIAGNOSIS — M8580 Other specified disorders of bone density and structure, unspecified site: Secondary | ICD-10-CM | POA: Diagnosis not present

## 2022-02-01 MED ORDER — ATORVASTATIN CALCIUM 20 MG PO TABS: 20.0000 mg | ORAL_TABLET | Freq: Every day | ORAL | 1 refills | Status: DC

## 2022-02-01 NOTE — Patient Instructions (Signed)
Your blood pressures from home look good.  Continue to check them periodically and let me know if they are increasing.  Stop atorvastatin 10 mg and start atorvastatin 20 mg.  I sent this to your mail order pharmacy.  Follow-up with me in 6 to 8 weeks.  Please come in fasting to recheck your cholesterol.  We will also check your liver function at that time.

## 2022-02-01 NOTE — Assessment & Plan Note (Signed)
Appears to only have elevated BP at medical visits. Readings from home are all within goal range. Medication not warranted.

## 2022-02-01 NOTE — Assessment & Plan Note (Signed)
Continue to monitor. Will recheck in 6-8 weeks. See GI as needed.

## 2022-02-01 NOTE — Assessment & Plan Note (Signed)
continue getting calcium and vitamin D and doing weight bearing exercises.

## 2022-02-01 NOTE — Assessment & Plan Note (Signed)
Recent labs show LDL not at goal. Atorvastatin increased to 20 mg daily. She will follow up in 6-8 weeks for recheck of fasting lipids and liver function.

## 2022-02-01 NOTE — Progress Notes (Signed)
Subjective:     Patient ID: Shawna Johnson, female    DOB: [birth year], 56 y.o.   MRN: [id number]  Chief Complaint  Patient presents with   Follow-up    F/u on elevated BP, states it has been looking good in the upper 120s.   Needs atorvastatin refill.     HPI Patient is in today for follow up on elevated BP. Readings at home over the past 4 weeks have all been w/in goal.   Taking atorvastatin 10 mg daily.   Labs from 12/22/2021 shows LDL 120, HDL 64, trigs 131 Hgb A1c 5.5% Elevated alk phos in past and most recently 142. Has seen Dr. Hilarie Fredrickson in the past for this and sees him now annually   Bone density done showed osteopenia  Last pap smear 12/22/2021 Mammogram 06/2021 Last colonoscopy 07/2014 (Dr. Hilarie Fredrickson)  Denies fever, chills, dizziness, chest pain, palpitations, shortness of breath, abdominal pain, N/V/D, urinary symptoms, LE edema.    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   Hyperlipidemia    Preventative health care 10/24/2015    Past Surgical History:  Procedure Laterality Date   CERVICAL CONIZATION W/BX  05/2006   COSMETIC SURGERY     eyelid   INNER EAR SURGERY     several childhood   TONSILLECTOMY AND ADENOIDECTOMY      Family History  Problem Relation Age of Onset   Arthritis Mother    High Cholesterol Mother    High blood pressure Mother    Heart attack Father    Hyperlipidemia Father    Hypertension Father    Sudden death Father    Heart disease Father        MI   Alcohol abuse Father    COPD Father    Early death Father    Congestive Heart Failure Sister    COPD Sister    Heart disease Sister        has pacer/defib in place    Cardiomyopathy Sister    Drug abuse Sister         crack in past   High Cholesterol Sister    High blood pressure Sister    Kidney disease Sister    Learning disabilities Sister    Mental illness Maternal Grandfather        suicide   Depression Maternal Grandfather    Heart disease Paternal Grandmother    High Cholesterol Paternal Grandmother    Heart disease Paternal Grandfather    High Cholesterol Paternal Grandfather    Breast cancer Maternal Aunt    Melanoma Maternal Aunt    Diabetes Maternal Aunt    Colon cancer Neg Hx    Esophageal cancer Neg Hx    Stomach cancer Neg Hx    Rectal cancer Neg Hx     Social History   Socioeconomic History   Marital status: Married    Spouse name: Not on file   Number of children: 1   Years of education: Not on file   Highest education level: Not on file  Occupational History   Occupation: Stylist  Tobacco Use   Smoking status: Former    Packs/day: 0.50    Years:  30.00    Total pack years: 15.00    Types: Cigarettes    Quit date: 07/02/2010    Years since quitting: 11.5   Smokeless tobacco: Former    Quit date: 04/24/2016  Vaping Use   Vaping Use: Never used  Substance and Sexual Activity   Alcohol use: Yes    Alcohol/week: 4.0 standard drinks of alcohol    Types: 4 Glasses of wine per week    Comment: weekly   Drug use: No   Sexual activity: Yes    Partners: Male    Birth control/protection: Post-menopausal    Comment: lives with husband, no dietary restrictions, works as Emergency planning/management officer, 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     KRILL OIL PO Take by mouth.     pantoprazole (PROTONIX) 40 MG tablet Take 1 tablet (40 mg total) by mouth daily. 90 tablet 1   vitamin C (ASCORBIC ACID) 250 MG tablet Take 250 mg by mouth daily.     atorvastatin (LIPITOR) 10 MG tablet Take 1 tablet (53m) daily. 30 tablet 0   No facility-administered medications prior to visit.    Allergies  Allergen Reactions   Misc. Sulfonamide Containing Compounds Swelling   Sulfa Antibiotics Swelling    ROS     Objective:    Physical Exam Constitutional:      General: She is not in acute distress.    Appearance: She is not ill-appearing.  Cardiovascular:     Rate and Rhythm: Normal rate.  Pulmonary:     Effort: Pulmonary effort is normal.  Neurological:     General: No focal deficit present.     Mental Status: She is alert and oriented to person, place, and time.  Psychiatric:        Mood and Affect: Mood normal.        Behavior: Behavior normal.     BP 136/82 (BP Location: Right Arm, Patient Position: Sitting, Cuff Size: Large)   Pulse 75   Temp 97.6 F (36.4 C) (Temporal)   Ht 5' 2"  (1.575 m)   Wt 136 lb (61.7 kg)   LMP 07/02/2013   SpO2 99%   BMI 24.87 kg/m  Wt Readings from Last 3 Encounters:  02/01/22 136 lb (61.7 kg)  01/09/22 150 lb (68 kg)  12/28/21 152 lb (68.9 kg)       Assessment & Plan:   Problem List Items Addressed This Visit       Cardiovascular and Mediastinum   Situational hypertension - Primary    Appears to only have elevated BP at medical visits. Readings from home are all within goal range. Medication not warranted.      Relevant Medications   atorvastatin (LIPITOR) 20 MG tablet     Musculoskeletal and Integument   Osteopenia    continue getting calcium and vitamin D and doing weight bearing exercises.         Other  Elevated alkaline phosphatase level    Continue to monitor. Will recheck in 6-8 weeks. See GI as needed.        Hyperlipidemia    Recent labs show LDL not at goal. Atorvastatin increased to 20 mg daily. She will follow up in 6-8 weeks for recheck of fasting lipids and liver function.        Relevant Medications   atorvastatin (LIPITOR) 20 MG tablet    I have discontinued Pierron "Ginger"'s atorvastatin. I am also having her start on atorvastatin. Additionally, I am having her maintain her calcium carbonate, vitamin C, KRILL OIL PO, and pantoprazole.  Meds ordered this encounter  Medications   atorvastatin (LIPITOR) 20 MG tablet    Sig: Take 1 tablet (20 mg total) by mouth daily.    Dispense:  90 tablet    Refill:  1    Order Specific Question:   Supervising Provider    Answer:   Pricilla Holm A [[id number]]

## 2022-02-07 ENCOUNTER — Other Ambulatory Visit: Payer: Self-pay | Admitting: Internal Medicine

## 2022-02-09 ENCOUNTER — Ambulatory Visit: Payer: Managed Care, Other (non HMO) | Admitting: Family Medicine

## 2022-02-12 ENCOUNTER — Ambulatory Visit: Payer: Managed Care, Other (non HMO) | Admitting: Family Medicine

## 2022-03-22 ENCOUNTER — Encounter: Payer: Self-pay | Admitting: Family Medicine

## 2022-03-22 ENCOUNTER — Other Ambulatory Visit: Payer: Self-pay | Admitting: Family Medicine

## 2022-03-22 ENCOUNTER — Ambulatory Visit: Payer: Managed Care, Other (non HMO) | Admitting: Family Medicine

## 2022-03-22 VITALS — BP 122/84 | HR 70 | Temp 97.6°F | Ht 62.0 in | Wt 153.0 lb

## 2022-03-22 DIAGNOSIS — E7800 Pure hypercholesterolemia, unspecified: Secondary | ICD-10-CM

## 2022-03-22 DIAGNOSIS — Z23 Encounter for immunization: Secondary | ICD-10-CM | POA: Diagnosis not present

## 2022-03-22 DIAGNOSIS — R748 Abnormal levels of other serum enzymes: Secondary | ICD-10-CM

## 2022-03-22 DIAGNOSIS — Z298 Encounter for other specified prophylactic measures: Secondary | ICD-10-CM

## 2022-03-22 DIAGNOSIS — R030 Elevated blood-pressure reading, without diagnosis of hypertension: Secondary | ICD-10-CM | POA: Diagnosis not present

## 2022-03-22 LAB — LIPID PANEL
Cholesterol: 197 mg/dL (ref 0–200)
HDL: 75.6 mg/dL (ref >39.00)
LDL Cholesterol: 107 mg/dL — ABNORMAL HIGH (ref 0–99)
NonHDL: 121.09
Total CHOL/HDL Ratio: 3
Triglycerides: 70 mg/dL (ref 0.0–149.0)
VLDL: 14 mg/dL (ref 0.0–40.0)

## 2022-03-22 LAB — COMPREHENSIVE METABOLIC PANEL
ALT: 16 U/L (ref 0–35)
AST: 16 U/L (ref 0–37)
Albumin: 4.2 g/dL (ref 3.5–5.2)
Alkaline Phosphatase: 135 U/L — ABNORMAL HIGH (ref 39–117)
BUN: 17 mg/dL (ref 6–23)
CO2: 29 mEq/L (ref 19–32)
Calcium: 9.4 mg/dL (ref 8.4–10.5)
Chloride: 103 mEq/L (ref 96–112)
Creatinine, Ser: 0.83 mg/dL (ref 0.40–1.20)
GFR: 77.83 mL/min (ref >60.00)
Glucose, Bld: 88 mg/dL (ref 70–99)
Potassium: 3.7 mEq/L (ref 3.5–5.1)
Sodium: 141 mEq/L (ref 135–145)
Total Bilirubin: 0.5 mg/dL (ref 0.2–1.2)
Total Protein: 7.1 g/dL (ref 6.0–8.3)

## 2022-03-22 MED ORDER — ATOVAQUONE-PROGUANIL HCL 62.5-25 MG PO TABS: 1.0000 | ORAL_TABLET | Freq: Every day | ORAL | 0 refills | Status: DC

## 2022-03-22 NOTE — Progress Notes (Signed)
Subjective:     Patient ID: Shawna Johnson, female    DOB: [birth year], 56 y.o.   MRN: [id number]  Chief Complaint  Patient presents with   Labs   Medication Management    Going to [place] in November, would like to discuss milaria medication (atovaquone/Proguanil)    HPI Patient is in today for follow up on HL. Increased atorvastatin from 10 mg to 20 mg in August. She is fasting.  We are also checking alk phos due to elevated alk phos 142 in June 2023.    Leaving in November for [place] and needs malaria prevention. She took Malarone the last time she went and did fine with it.  She is a Geophysicist/field seismologist.   Denies fever, chills, dizziness, chest pain, palpitations, shortness of breath, abdominal pain, N/V/D.    There are no preventive care reminders to display for this patient.   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   Hyperlipidemia    Preventative health care 10/24/2015    Past Surgical History:  Procedure Laterality Date   CERVICAL CONIZATION W/BX  05/2006   COSMETIC SURGERY     eyelid   INNER EAR SURGERY     several childhood   TONSILLECTOMY AND ADENOIDECTOMY      Family History  Problem Relation Age of Onset   Arthritis Mother    High Cholesterol Mother    High blood pressure Mother    Heart attack Father    Hyperlipidemia Father    Hypertension Father    Sudden death Father    Heart disease Father        MI   Alcohol abuse Father    COPD Father    Early death Father    Congestive Heart Failure Sister    COPD Sister    Heart disease Sister        has pacer/defib in place    Cardiomyopathy Sister    Drug abuse Sister        crack in past   High Cholesterol Sister    High blood pressure Sister    Kidney disease Sister    Learning disabilities Sister    Mental  illness Maternal Grandfather        suicide   Depression Maternal Grandfather    Heart disease Paternal Grandmother    High Cholesterol Paternal Grandmother    Heart disease Paternal Grandfather    High Cholesterol Paternal Grandfather    Breast cancer Maternal Aunt    Melanoma Maternal Aunt    Diabetes Maternal Aunt    Colon cancer Neg Hx    Esophageal cancer Neg Hx    Stomach cancer Neg Hx    Rectal cancer Neg Hx     Social History   Socioeconomic History   Marital status: Married    Spouse name: Not on file   Number of children: 1   Years of education: Not on file   Highest education level: Not on file  Occupational History   Occupation: Stylist  Tobacco Use   Smoking status: Former    Packs/day: 0.50    Years: 30.00    Total pack years: 15.00    Types: Cigarettes    Quit date: 07/02/2010    Years since quitting: 11.7   Smokeless  tobacco: Former    Quit date: 04/24/2016  Vaping Use   Vaping Use: Never used  Substance and Sexual Activity   Alcohol use: Yes    Alcohol/week: 4.0 standard drinks of alcohol    Types: 4 Glasses of wine per week    Comment: weekly   Drug use: No   Sexual activity: Yes    Partners: Male    Birth control/protection: Post-menopausal    Comment: lives with husband, no dietary restrictions, works as Emergency planning/management officer, 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 90 tablet 1   calcium carbonate (OS-CAL) 1250 (500 Ca) MG chewable tablet Chew 2 tablets by mouth daily.      KRILL OIL PO Take by mouth.     pantoprazole (PROTONIX) 40 MG tablet TAKE 1 TABLET(40 MG) BY MOUTH DAILY 90 tablet 1   vitamin C (ASCORBIC ACID) 250 MG tablet Take 250 mg by mouth daily.     No facility-administered medications prior to visit.    Allergies  Allergen Reactions   Misc. Sulfonamide Containing Compounds Swelling   Sulfa Antibiotics Swelling    ROS     Objective:    Physical Exam  BP 122/84 (BP Location: Left Arm, Patient Position: Sitting, Cuff Size: Large)   Pulse 70   Temp 97.6 F (36.4 C) (Temporal)   Ht 5' 2"  (1.575 m)   Wt 153 lb (69.4 kg)   LMP 07/02/2013   SpO2 98%   BMI 27.98 kg/m  Wt Readings from Last 3 Encounters:  03/22/22 153 lb (69.4 kg)  02/01/22 136 lb (61.7 kg)  01/09/22 150 lb (68 kg)   Alert and in no distress. Respirations unlabored. Normal mood     Assessment & Plan:   Problem List Items Addressed This Visit       Cardiovascular and Mediastinum   Situational hypertension     Other   Elevated alkaline phosphatase level   Relevant Orders   Comprehensive metabolic panel (Completed)   Hyperlipidemia - Primary   Relevant Orders   Lipid panel (Completed)   Other Visit Diagnoses     Need for influenza vaccination       Relevant Orders   Flu Vaccine QUAD 39moIM (Fluarix, Fluzone & Alfiuria Quad PF) (Completed)   Need for malaria prophylaxis       Relevant Medications   Atovaquone-Proguanil HCl 62.5-25 MG tablet      Blood pressure elevated upon arrival but improved by the end of her visit.  Normal blood pressures at home. Antimalaria medication prescribed and instructed how to take the medication. Monitoring alkaline phosphatase since it was elevated at her GI appointment in June.  Normal transaminases Check fasting lipids and refill atorvastatin  I am having VOtsego"Ginger" start on Atovaquone-Proguanil HCl. I am also having her maintain her calcium carbonate, vitamin C, KRILL OIL PO, atorvastatin, and  pantoprazole.  Meds ordered this encounter  Medications   Atovaquone-Proguanil HCl 62.5-25 MG tablet  Sig: Take 1 tablet by mouth daily.    Dispense:  20 tablet    Refill:  0    Order Specific Question:   Supervising Provider    Answer:   Pricilla Holm A [[id number]]

## 2022-04-01 ENCOUNTER — Encounter: Payer: Self-pay | Admitting: Family Medicine

## 2022-04-02 ENCOUNTER — Other Ambulatory Visit: Payer: Self-pay | Admitting: Family Medicine

## 2022-04-02 DIAGNOSIS — Z2989 Encounter for other specified prophylactic measures: Secondary | ICD-10-CM

## 2022-04-02 MED ORDER — ATOVAQUONE-PROGUANIL HCL 250-100 MG PO TABS: 1.0000 | ORAL_TABLET | Freq: Every day | ORAL | 0 refills | Status: DC

## 2022-04-02 NOTE — Telephone Encounter (Signed)
Pt has question in regards to Malarone prescription dosage and if it is correct

## 2022-04-24 ENCOUNTER — Ambulatory Visit: Payer: Self-pay

## 2022-04-24 ENCOUNTER — Ambulatory Visit: Payer: Managed Care, Other (non HMO) | Admitting: Family Medicine

## 2022-04-24 VITALS — BP 152/88 | HR 82 | Ht 62.0 in | Wt 154.0 lb

## 2022-04-24 DIAGNOSIS — M25561 Pain in right knee: Secondary | ICD-10-CM | POA: Diagnosis not present

## 2022-04-24 DIAGNOSIS — G8929 Other chronic pain: Secondary | ICD-10-CM

## 2022-04-24 DIAGNOSIS — M170 Bilateral primary osteoarthritis of knee: Secondary | ICD-10-CM | POA: Diagnosis not present

## 2022-04-24 DIAGNOSIS — M25562 Pain in left knee: Secondary | ICD-10-CM

## 2022-04-24 MED ORDER — TRIAMCINOLONE ACETONIDE 32 MG IX SRER
64.0000 mg | Freq: Once | INTRA_ARTICULAR | Status: AC
  Administered 2022-04-24: 64 mg via INTRA_ARTICULAR

## 2022-04-24 NOTE — Progress Notes (Signed)
I, Peterson Lombard, LAT, ATC acting as a scribe for [name], MD.  Shawna Johnson is a 56 y.o. female who presents to [place] at [facility] today for cont'd bilat knee pain. Pt was last seen by Dr. Georgina Snell on 01/22/22 and was given bilat Zilretta injections. Dr. Raeford Razor also gave pt bilat Zilretta injections on 09/13/21. Pt completed bilat Orthovisc, 4/4, injections by Dr. Darene Lamer on 10/28/20. Today, pt reports bilat knee pain has returned over the last 3 weeks. Pt notes how great her knees felt after the prior Zilretta injections.  She is leaving for a photography trip to [place] photograph [place] gor[place] in 2 weeks!  Dx imaging: 07/21/20 R & L knee XR  Pertinent review of systems: No fevers or chills  Relevant historical information: Thyroid nodules.  Knee arthritis.   Exam:  BP (!) 152/88   Pulse 82   Ht '5\' 2"'$  (1.575 m)   Wt 154 lb (69.9 kg)   LMP 07/02/2013   SpO2 97%   BMI 28.17 kg/m  General: Well Developed, well nourished, and in no acute distress.   MSK: Knees bilaterally mild effusion decreased range of motion with crepitation.    Lab and Radiology Results  Zilretta bilateral knees Procedure: Real-time Ultrasound Guided Injection of right knee superior lateral patellar space Device: Philips Affiniti 50G Images permanently stored and available for review in PACS Verbal informed consent obtained.  Discussed risks and benefits of procedure. Warned about infection, bleeding, hyperglycemia damage to structures among others. Patient expresses understanding and agreement Time-out conducted.   Noted no overlying erythema, induration, or other signs of local infection.   Skin prepped in a sterile fashion.   Local anesthesia: Topical Ethyl chloride.   With sterile technique and under real time ultrasound guidance: Zilretta 32 mg injected into the knee joint. Fluid seen entering the joint capsule.   Completed without difficulty   Advised to call if  fevers/chills, erythema, induration, drainage, or persistent bleeding.   Images permanently stored and available for review in the ultrasound unit.  Impression: Technically successful ultrasound guided injection.    Procedure: Real-time Ultrasound Guided Injection of left knee superior lateral patellar space Device: Philips Affiniti 50G Images permanently stored and available for review in PACS Verbal informed consent obtained.  Discussed risks and benefits of procedure. Warned about infection, bleeding, hyperglycemia damage to structures among others. Patient expresses understanding and agreement Time-out conducted.   Noted no overlying erythema, induration, or other signs of local infection.   Skin prepped in a sterile fashion.   Local anesthesia: Topical Ethyl chloride.   With sterile technique and under real time ultrasound guidance: Zilretta 32 mg injected into knee joint. Fluid seen entering the joint capsule.   Completed without difficulty   Advised to call if fevers/chills, erythema, induration, drainage, or persistent bleeding.   Images permanently stored and available for review in the ultrasound unit.  Impression: Technically successful ultrasound guided injection.  Lot Number: [id number]  Assessment and Plan: 56 y.o. female with bilateral knee pain due to DJD.  This is a acute exacerbation of chronic pain.  Plan for repeat Zilretta injection today.  It has been 3 months.  We spent time also talking about future plans.  Ultimately she will likely need a total knee replacement.  She identifies the summer is a better time for her.  She will do some thinking.  For now we can repeat Zilretta injections every 3 months.   PDMP not reviewed this encounter.  Orders Placed This Encounter  Procedures   [place] LIMITED JOINT SPACE STRUCTURES LOW BILAT(NO LINKED CHARGES)    Order Specific Question:   Reason for Exam (SYMPTOM  OR DIAGNOSIS REQUIRED)    Answer:   Bilateral knee pain    Order  Specific Question:   Preferred imaging location?    Answer:   [place]   Meds ordered this encounter  Medications   Triamcinolone Acetonide (ZILRETTA) intra-articular injection 64 mg     Discussed warning signs or symptoms. Please see discharge instructions. Patient expresses understanding.   The above documentation has been reviewed and is accurate and complete [name], M.D.

## 2022-04-24 NOTE — Patient Instructions (Addendum)
Thank you for coming in today.   You received an injection today. Seek immediate medical attention if the joint becomes red, extremely painful, or is oozing fluid.   Check back as needed 

## 2022-04-30 ENCOUNTER — Other Ambulatory Visit: Payer: Self-pay | Admitting: Family Medicine

## 2022-04-30 MED ORDER — AZITHROMYCIN 500 MG PO TABS: 500.0000 mg | ORAL_TABLET | Freq: Every day | ORAL | 0 refills | Status: DC

## 2022-04-30 NOTE — Telephone Encounter (Signed)
Pt is leaving Friday for [place] and is requesting 4 Azithromycin tablets to take with her in case of traveler's diarrhea. The ones she has expired in 2022 and would like a new rx if possible.

## 2022-05-19 ENCOUNTER — Encounter: Payer: Self-pay | Admitting: Family Medicine

## 2022-06-30 ENCOUNTER — Encounter: Payer: Self-pay | Admitting: Family Medicine

## 2022-07-03 ENCOUNTER — Other Ambulatory Visit: Payer: Self-pay | Admitting: Family Medicine

## 2022-07-03 DIAGNOSIS — Z1231 Encounter for screening mammogram for malignant neoplasm of breast: Secondary | ICD-10-CM

## 2022-07-03 DIAGNOSIS — Z2989 Encounter for other specified prophylactic measures: Secondary | ICD-10-CM

## 2022-07-03 MED ORDER — ATOVAQUONE-PROGUANIL HCL 250-100 MG PO TABS: 1.0000 | ORAL_TABLET | Freq: Every day | ORAL | 0 refills | Status: DC

## 2022-07-03 NOTE — Telephone Encounter (Signed)
Pt will be going to [place] for 11 days in February and would like about 6 more pills of Malarone sent in for her trip as she still has about 14 pills left from [place] trip?   Pt also wants to make sure she is addressing you correctly.

## 2022-07-13 ENCOUNTER — Other Ambulatory Visit: Payer: Self-pay | Admitting: Family Medicine

## 2022-07-13 DIAGNOSIS — E7800 Pure hypercholesterolemia, unspecified: Secondary | ICD-10-CM

## 2022-07-17 ENCOUNTER — Telehealth: Payer: Self-pay | Admitting: *Deleted

## 2022-07-17 NOTE — Telephone Encounter (Signed)
-----  Message from Mare Ferrari sent at 07/13/2022  9:59 AM EST ----- Regarding: Zilretta Please re-auth Shawna Johnson bilat

## 2022-07-17 NOTE — Telephone Encounter (Signed)
Bilateral Zilretta initiated through portal.

## 2022-07-19 NOTE — Telephone Encounter (Signed)
Prior auth submitted through cover my meds

## 2022-07-20 ENCOUNTER — Other Ambulatory Visit: Payer: Self-pay | Admitting: Internal Medicine

## 2022-07-24 NOTE — Telephone Encounter (Signed)
Spoke to [employer]. This is still under clinical review.

## 2022-07-26 ENCOUNTER — Ambulatory Visit: Payer: Self-pay

## 2022-07-26 ENCOUNTER — Ambulatory Visit: Payer: Managed Care, Other (non HMO) | Admitting: Family Medicine

## 2022-07-26 VITALS — BP 152/88 | HR 86 | Ht 62.0 in | Wt 154.0 lb

## 2022-07-26 DIAGNOSIS — G8929 Other chronic pain: Secondary | ICD-10-CM

## 2022-07-26 DIAGNOSIS — M170 Bilateral primary osteoarthritis of knee: Secondary | ICD-10-CM

## 2022-07-26 DIAGNOSIS — M25562 Pain in left knee: Secondary | ICD-10-CM

## 2022-07-26 DIAGNOSIS — M25561 Pain in right knee: Secondary | ICD-10-CM

## 2022-07-26 MED ORDER — TRIAMCINOLONE ACETONIDE 32 MG IX SRER
64.0000 mg | Freq: Once | INTRA_ARTICULAR | Status: AC
  Administered 2022-07-26: 64 mg via INTRA_ARTICULAR

## 2022-07-26 NOTE — Telephone Encounter (Signed)
Insurance has not approved Zilretta for pt, she is aware & would like to move forward with getting the injections. She is aware she may have to pay OOP.

## 2022-07-26 NOTE — Progress Notes (Signed)
I, Shawna Johnson, LAT, ATC acting as a scribe for Shawna Leader, MD.  Shawna Johnson is a 57 y.o. female who presents to [place] at [facility] today for cont'd chronic bilat knee pain.  Patient was last seen by Dr. Georgina Snell on 04/24/2022, which was about 2 weeks prior to her trip to [place] to photograph gorillas, and was given bilat Zilretta injections.  Today, patient reports bilat knee pain returning 2-3 weeks ago. Pt notes the L knee feels like it is swollen. Pt is wanting to repeat bilat Zilretta injections today.  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 She recently had a successful trip to [place] where she photographed [place].  Dx imaging: 07/21/20 R & L knee XR   Pertinent review of systems: No fevers or chills  Relevant historical information: Bilateral knee arthritis   Exam:  BP (!) 152/88   Pulse 86   Ht '5\' 2"'$  (1.575 m)   Wt 154 lb (69.9 kg)   LMP 07/02/2013   SpO2 99%   BMI 28.17 kg/m  General: Well Developed, well nourished, and in no acute distress.   MSK: Right knee mild effusion otherwise normal-appearing. Normal motion with crepitation.  Left knee: Mild effusion otherwise normal-appearing. Normal motion with crepitation.    Lab and Radiology Results  Zilretta injection bilateral knees  Procedure: Real-time Ultrasound Guided Injection of right knee superior lateral patellar space Device: Philips Affiniti 50G Images permanently stored and available for review in PACS Verbal informed consent obtained.  Discussed risks and benefits of procedure. Warned about infection, bleeding, hyperglycemia damage to structures among others. Patient expresses understanding and agreement Time-out conducted.   Noted no overlying erythema, induration, or other signs of local infection.   Skin prepped in a sterile fashion.   Local anesthesia: Topical Ethyl chloride.   With sterile technique and under real time ultrasound  guidance: Zilretta 32 mg injected into knee joint. Fluid seen entering the joint capsule.   Completed without difficulty   Advised to call if fevers/chills, erythema, induration, drainage, or persistent bleeding.   Images permanently stored and available for review in the ultrasound unit.  Impression: Technically successful ultrasound guided injection.    Procedure: Real-time Ultrasound Guided Injection of left knee superior lateral patellar space Device: Philips Affiniti 50G Images permanently stored and available for review in PACS Verbal informed consent obtained.  Discussed risks and benefits of procedure. Warned about infection, bleeding, hyperglycemia damage to structures among others. Patient expresses understanding and agreement Time-out conducted.   Noted no overlying erythema, induration, or other signs of local infection.   Skin prepped in a sterile fashion.   Local anesthesia: Topical Ethyl chloride.   With sterile technique and under real time ultrasound guidance: Zilretta 32 mg injected into knee joint. Fluid seen entering the joint capsule.   Completed without difficulty   Advised to call if fevers/chills, erythema, induration, drainage, or persistent bleeding.   Images permanently stored and available for review in the ultrasound unit.  Impression: Technically successful ultrasound guided injection.  Lot number: [id number] for both injections      Assessment and Plan: 57 y.o. female with bilateral knee pain due to DJD.  She is reaching the end of conservative management for her knee pain due to DJD.  She is now receiving Zilretta injections about every 3 months which is stretching the limits of conservative management.  This is currently working but I fear would not be a  sustainable long-term strategy.  After discussion she is willing to talk knee replacement options with an orthopedic surgeon.  She will be getting back from her trip to [place] in early March and would be  happy to consider knee replacement this summer if it is the best bet.  She would like to know more.   PDMP not reviewed this encounter. Orders Placed This Encounter  Procedures   [place] LIMITED JOINT SPACE STRUCTURES LOW BILAT(NO LINKED CHARGES)    Order Specific Question:   Reason for Exam (SYMPTOM  OR DIAGNOSIS REQUIRED)    Answer:   Bilateral knee pain    Order Specific Question:   Preferred imaging location?    Answer:   [place]   Ambulatory referral to Orthopedic Surgery    Referral Priority:   Routine    Referral Type:   Surgical    Referral Reason:   Specialty Services Required    Requested Specialty:   Orthopedic Surgery    Number of Visits Requested:   1   Meds ordered this encounter  Medications   Triamcinolone Acetonide (ZILRETTA) intra-articular injection 64 mg     Discussed warning signs or symptoms. Please see discharge instructions. Patient expresses understanding.   The above documentation has been reviewed and is accurate and complete Shawna Johnson, M.D.

## 2022-07-26 NOTE — Patient Instructions (Addendum)
Thank you for coming in today.   You received an injection today. Seek immediate medical attention if the joint becomes red, extremely painful, or is oozing fluid.   Let me know how it goes.   We can do it again in 3 months.   I think seeing a surgeon when you get back from [place] is a good idea.   Send some pics after your trip.

## 2022-08-29 ENCOUNTER — Ambulatory Visit
Admission: RE | Admit: 2022-08-29 | Discharge: 2022-08-29 | Disposition: A | Discharge status: Home / Self Care | Payer: Managed Care, Other (non HMO) | Source: Ambulatory Visit | Attending: Family Medicine | Admitting: Family Medicine

## 2022-08-29 DIAGNOSIS — Z1231 Encounter for screening mammogram for malignant neoplasm of breast: Secondary | ICD-10-CM

## 2022-09-07 ENCOUNTER — Ambulatory Visit: Payer: Managed Care, Other (non HMO) | Admitting: Internal Medicine

## 2022-09-07 ENCOUNTER — Other Ambulatory Visit (INDEPENDENT_AMBULATORY_CARE_PROVIDER_SITE_OTHER): Payer: Managed Care, Other (non HMO)

## 2022-09-07 ENCOUNTER — Encounter: Payer: Self-pay | Admitting: Internal Medicine

## 2022-09-07 VITALS — BP 122/80 | HR 88 | Ht 62.0 in | Wt 154.0 lb

## 2022-09-07 DIAGNOSIS — K2100 Gastro-esophageal reflux disease with esophagitis, without bleeding: Secondary | ICD-10-CM

## 2022-09-07 DIAGNOSIS — R748 Abnormal levels of other serum enzymes: Secondary | ICD-10-CM | POA: Diagnosis not present

## 2022-09-07 DIAGNOSIS — M8580 Other specified disorders of bone density and structure, unspecified site: Secondary | ICD-10-CM

## 2022-09-07 LAB — COMPREHENSIVE METABOLIC PANEL
ALT: 14 U/L (ref 0–35)
AST: 14 U/L (ref 0–37)
Albumin: 4.1 g/dL (ref 3.5–5.2)
Alkaline Phosphatase: 117 U/L (ref 39–117)
BUN: 13 mg/dL (ref 6–23)
CO2: 28 mEq/L (ref 19–32)
Calcium: 9.8 mg/dL (ref 8.4–10.5)
Chloride: 104 mEq/L (ref 96–112)
Creatinine, Ser: 0.83 mg/dL (ref 0.40–1.20)
GFR: 77.58 mL/min (ref >60.00)
Glucose, Bld: 79 mg/dL (ref 70–99)
Potassium: 3.6 mEq/L (ref 3.5–5.1)
Sodium: 143 mEq/L (ref 135–145)
Total Bilirubin: 0.5 mg/dL (ref 0.2–1.2)
Total Protein: 6.8 g/dL (ref 6.0–8.3)

## 2022-09-07 LAB — VITAMIN D 25 HYDROXY (VIT D DEFICIENCY, FRACTURES): VITD: 37.03 ng/mL (ref 30.00–100.00)

## 2022-09-07 LAB — MAGNESIUM: Magnesium: 2 mg/dL (ref 1.5–2.5)

## 2022-09-07 MED ORDER — PANTOPRAZOLE SODIUM 40 MG PO TBEC: DELAYED_RELEASE_TABLET | ORAL | 3 refills | Status: DC

## 2022-09-07 NOTE — Progress Notes (Signed)
Subjective:    Patient ID: Shawna Johnson, female    DOB: 01-Mar-1966, 57 y.o.   MRN: [id number]  HPI Ginger Leisinger is a 57 year old female with a history of GERD with esophagitis and associated dysphagia, prior PUD, osteopenia who is here for follow-up.  She is here alone today and was last seen in January 2023.  She reports she has been doing very well.  She is maintained on pantoprazole 40 mg a day.  Heartburn is very well-controlled without dysphagia or odynophagia.  No abdominal pain.  Regular bowel movements.  No blood in stool or melena.  She did have bone mineral density last year and is taking a daily calcium supplement with vitamin D.  She is on sure what the vitamin D dose is.   Review of Systems As per HPI, otherwise negative  Current Medications, Allergies, Past Medical History, Past Surgical History, Family History and Social History were reviewed in [employer] record.    Objective:   Physical Exam BP 122/80 Comment: second attempt  Pulse 88   Ht '5\' 2"'$  (1.575 m)   Wt 154 lb (69.9 kg)   LMP 07/02/2013   BMI 28.17 kg/m  Gen: awake, alert, NAD HEENT: anicteric  Neuro: nonfocal   Exam: DUAL X-RAY ABSORPTIOMETRY (DXA) FOR BONE MINERAL DENSITY (BMD) Instrument: [employer] Requesting Provider: Dr. Hilarie Fredrickson Indication: Screening for low BMD in patients on chronic PPIs Comparison: none (please note that it is not possible to compare data from different instruments) Clinical data: Pt is a 57 y.o. female without previous history of fracture. On calcium.   Results:   Lumbar spine L1-L4 (L2, L3) Femoral neck (FN) 33% distal radius  T-score -1.3 RFN: -0.7 LFN: -0.6 n/a    Assessment: the BMD is low according to the [facility] classification for osteoporosis (see below). Fracture risk: moderate FRAX score: 10 year major osteoporotic risk: 6.1%. 10 year hip fracture risk: 0.2%. The thresholds for treatment are 20% and 3%, respectively. Comments:  the technical quality of the study is good, however, L2 and L3 vertebrae had to be excluded from analysis due to degenerative changes. Evaluation for secondary causes should be considered if clinically indicated.  Recommend optimizing calcium (1200 mg/day) and vitamin D (800 IU/day) intake.  Followup: Repeat BMD is appropriate after 2 years.   WHO criteria for diagnosis of osteoporosis in postmenopausal women and in men 38 y/o or older:  - normal: T-score -1.0 to + 1.0 - osteopenia/low bone density: T-score between -2.5 and -1.0 - osteoporosis: T-score below -2.5 - severe osteoporosis: T-score below -2.5 with history of fragility fracture Note: although not part of the WHO classification, the presence of a fragility fracture, regardless of the T-score, should be considered diagnostic of osteoporosis, provided other causes for the fracture have been excluded.   Treatment: The National Osteoporosis Foundation recommends that treatment be considered in postmenopausal women and men age 74 or older with: 1. Hip or vertebral (clinical or morphometric) fracture 2. T-score of - 2.5 or lower at the spine or hip 3. 10-year fracture probability by FRAX of at least 20% for a major osteoporotic fracture and 3% for a hip fracture   Philemon Kingdom, MD [facility]           Assessment & Plan:  57 year old female with a history of GERD with esophagitis and associated dysphagia, prior PUD, osteopenia who is here for follow-up.   GERD with history of esophagitis and dysphagia responsive to PPI --she  is doing very well on PPI therapy.  Her GERD with history of esophagitis warrants ongoing therapy.  She is happy with this as her symptoms are well-controlled.  We did discuss the importance of calcium and magnesium metabolism and following bone health closely while on PPI. -- Continue pantoprazole 40 mg daily -- Check magnesium, CMP and vitamin D today  2.  Osteopenia --PPI as above.  No history  of fracture.  Getting calcium supplementation -- Check vitamin D today -- Calcium 1200 mg with vitamin D 1000 international units recommended daily -- Repeat bone mineral density in 1 year  3.  Prior elevated liver enzymes --check CMP today  4.  Colon cancer screening --no adenomatous polyps at colonoscopy in January 2016 --Screening colonoscopy recommended January 2026  Annual follow-up

## 2022-09-07 NOTE — Patient Instructions (Addendum)
_______________________________________________________  If your blood pressure at your visit was 140/90 or greater, please contact your primary care physician to follow up on this.  _______________________________________________________  If you are age 59 or older, your body mass index should be between 23-30. Your Body mass index is 28.17 kg/m. If this is out of the aforementioned range listed, please consider follow up with your Primary Care Provider.  If you are age 67 or younger, your body mass index should be between 19-25. Your Body mass index is 28.17 kg/m. If this is out of the aformentioned range listed, please consider follow up with your Primary Care Provider.   ________________________________________________________  The  GI providers would like to encourage you to use [facility] to communicate with providers for non-urgent requests or questions.  Due to long hold times on the telephone, sending your provider a message by [facility] may be a faster and more efficient way to get a response.  Please allow 48 business hours for a response.  Please remember that this is for non-urgent requests.  _______________________________________________________  Your provider has requested that you go to the basement level for lab work before leaving today. Press "B" on the elevator. The lab is located at the first door on the left as you exit the elevator.  Take 1200 mg of calcium daily   Take 1000 mg of vitamin d daily   Follow up in a year.  We have sent the following medications to your pharmacy for you to pick up at your convenience: Pantoprazole

## 2022-09-10 ENCOUNTER — Ambulatory Visit (INDEPENDENT_AMBULATORY_CARE_PROVIDER_SITE_OTHER): Payer: Managed Care, Other (non HMO)

## 2022-09-10 ENCOUNTER — Ambulatory Visit: Payer: Managed Care, Other (non HMO) | Admitting: Orthopedic Surgery

## 2022-09-10 ENCOUNTER — Encounter: Payer: Self-pay | Admitting: Orthopedic Surgery

## 2022-09-10 DIAGNOSIS — M25561 Pain in right knee: Secondary | ICD-10-CM

## 2022-09-10 DIAGNOSIS — M25562 Pain in left knee: Secondary | ICD-10-CM | POA: Diagnosis not present

## 2022-09-10 NOTE — Progress Notes (Signed)
Office Visit Note   Patient: Shawna Johnson           Date of Birth: 02-28-1966           MRN: [id number] Visit Date: 09/10/2022 Requested by: Shawna Hams, MD [place],  [place] [id number] PCP: Shawna Rm, NP-C  Subjective: Chief Complaint  Patient presents with   Right Knee - Pain   Left Knee - Pain    HPI: Shawna Johnson is a 57 y.o. female who presents to the office reporting bilateral knee pain.  She does Radiographer, therapeutic.  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  She likes to workout frequently as well.  This typically involves walking about 3 miles a day.  She has been using Zilretta injections 3-4 times a year and that makes her feel essentially normal.  Gel injections have been tried which have not helped.  The pain does not wake her from sleep.  Injections help her swelling.  She states her walking and standing endurance are okay.  She has modified her workout activity to not do any jumping and to change to low impact.  She also volunteers at the [employer]..                ROS: All systems reviewed are negative as they relate to the chief complaint within the history of present illness.  Patient denies fevers or chills.  Assessment & Plan: Visit Diagnoses:  1. Pain in both knees, unspecified chronicity     Plan: Impression is moderate knee arthritis which has been very well-managed with injections.  Recommended that Ginger consider going to 3 injections/year as opposed to 4.  I think eventually she is going to need knee replacement but currently with her range of motion and strength and activity modification is not indicated at this time.  She will follow-up once these injections and her activity modifications began to help her less.  Follow-Up Instructions: No follow-ups on file.   Orders:  Orders Placed This Encounter  Procedures   XR KNEE 3 VIEW RIGHT   XR KNEE 3 VIEW LEFT   No orders of the defined types were placed in this  encounter.     Procedures: No procedures performed   Clinical Data: No additional findings.  Objective: Vital Signs: LMP 07/02/2013   Physical Exam:  Constitutional: Patient appears well-developed HEENT:  Head: Normocephalic Eyes:EOM are normal Neck: Normal range of motion Cardiovascular: Normal rate Pulmonary/chest: Effort normal Neurologic: Patient is alert Skin: Skin is warm Psychiatric: Patient has normal mood and affect  Ortho Exam: Ortho exam demonstrates full range of motion of both knees.  Trace effusion on the left no effusion on the right.  Collateral crucial ligaments are stable.  Has slight medial joint line tenderness.  Extensor mechanism intact.  No groin pain with internal/external Tatian of the leg.  Ankle dorsiflexion intact.  Pedal pulses palpable.  Specialty Comments:  No specialty comments available.  Imaging: No results found.   PMFS History: Patient Active Problem List   Diagnosis Date Noted   Situational hypertension 02/01/2022   Osteopenia 02/01/2022   Adhesive capsulitis of toe, left 09/05/2021   Mass of joint of right shoulder 07/14/2021   Subscapularis tendinitis, right 07/13/2021   COVID-19 05/22/2021   Well adult exam 12/19/2020   Primary osteoarthritis of both knees 07/21/2020   Chronic pain of right ankle 07/21/2020   Peptic ulcer 07/12/2020   Rotator cuff tendinitis, left 07/12/2020  Cervical cancer screening 12/14/2019   Palpitations 12/09/2018   Mixed conductive and sensorineural hearing loss of right ear with restricted hearing of left ear 01/30/2018   Perforation of right tympanic membrane 01/30/2018   Goiter, 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   Elevated alkaline phosphatase level 04/24/2016   Preventative health care 10/24/2015   Arthritis    Hepatic steatosis 08/23/2014   Multiple thyroid nodules  see U/S 2015 mm sized in left lobe 05/16/2014   Breast calcifications on mammogram  R breast stable for 18 months [facility]  01/07/2014   Breast nodule  decreasing size mm 09/2013 [facility] 01/07/2014   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   Abnormal Pap smear of cervix  S/P Leep normal pap since 2007 12/22/2013   Hyperlipidemia 12/22/2013   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   Hyperlipidemia    Preventative health care 10/24/2015    Family History  Problem Relation Age of Onset   Arthritis Mother    High Cholesterol Mother    High blood pressure Mother    Heart attack Father    Hyperlipidemia Father    Hypertension Father    Sudden death Father    Heart disease Father        MI   Alcohol abuse Father    COPD Father    Early death Father    Congestive Heart Failure Sister    COPD Sister    Heart disease Sister        has pacer/defib in place    Cardiomyopathy Sister    Drug abuse Sister        crack in past   High Cholesterol Sister    High blood pressure Sister    Kidney disease Sister    Learning disabilities Sister    Mental illness Maternal Grandfather        suicide   Depression Maternal Grandfather    Heart disease Paternal Grandmother    High Cholesterol Paternal Grandmother    Heart disease Paternal Grandfather    High Cholesterol Paternal Grandfather    Breast cancer Maternal Aunt    Melanoma Maternal Aunt    Diabetes Maternal Aunt    Colon cancer Neg Hx    Esophageal cancer Neg Hx    Stomach cancer Neg Hx    Rectal cancer Neg Hx     Past Surgical History:  Procedure Laterality Date   CERVICAL CONIZATION W/BX  05/2006   COSMETIC SURGERY     eyelid   INNER EAR SURGERY     several childhood   TONSILLECTOMY AND ADENOIDECTOMY     Social History   Occupational History   Occupation: Careers adviser  Tobacco Use   Smoking status: Former    Packs/day: 0.50    Years: 30.00     Total pack years: 15.00    Types: Cigarettes    Quit date: 07/02/2010    Years since quitting: 12.2   Smokeless tobacco: Former    Quit date: 04/24/2016  Vaping Use   Vaping Use: Never used  Substance and Sexual Activity   Alcohol use: Yes    Alcohol/week: 4.0 standard drinks of alcohol    Types: 4 Glasses of wine per week    Comment: weekly   Drug use: No   Sexual activity:  Yes    Partners: Male    Birth control/protection: Post-menopausal    Comment: lives with husband, no dietary restrictions, works as Emergency planning/management officer, 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

## 2022-10-11 ENCOUNTER — Encounter: Payer: Self-pay | Admitting: Family Medicine

## 2022-10-11 ENCOUNTER — Other Ambulatory Visit: Payer: Self-pay | Admitting: Family Medicine

## 2022-10-11 DIAGNOSIS — E7800 Pure hypercholesterolemia, unspecified: Secondary | ICD-10-CM

## 2022-10-12 MED ORDER — ATORVASTATIN CALCIUM 20 MG PO TABS: ORAL_TABLET | ORAL | 0 refills | Status: DC

## 2022-10-15 ENCOUNTER — Encounter: Payer: Self-pay | Admitting: *Deleted

## 2022-10-19 ENCOUNTER — Encounter: Payer: Self-pay | Admitting: Family Medicine

## 2022-10-25 NOTE — Progress Notes (Signed)
Rubin Payor, PhD, LAT, ATC acting as a scribe for Clementeen Graham, MD.  Shawna Johnson is a 56 y.o. female who presents to [facility] at [facility] today for  cont'd chronic bilat knee pain.  Patient was last seen by Dr. Denyse Amass on 07/26/2022 and was given repeat bilateral Zilretta injections.  Patient also had a surgical consultation with Dr. August Saucer on 09/10/2022.  Today, patient reports R knee is OK, but the L is very painful and swollen. This started about 2 wks ago. 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   Dx imaging: 09/10/2022 R & L knee XR 07/21/20 R & L knee XR   Pertinent review of systems: No fevers or chills  Relevant historical information: Knee pain and arthritis. She is an accomplished Forensic psychologist.  Exam:  BP 122/80   Pulse 88   Ht 5\' 2"  (1.575 m)   Wt 154 lb (69.9 kg)   LMP 07/02/2013   SpO2 99%   BMI 28.17 kg/m  General: Well Developed, well nourished, and in no acute distress.   MSK: Bilateral knees significant for effusion left worse than right.  No erythema is present.  Normal motion.    Lab and Radiology Results  Zilretta injection bilateral knees Procedure: Real-time Ultrasound Guided Injection of right knee joint superior lateral patellar space Device: Philips Affiniti 50G Images permanently stored and available for review in PACS Verbal informed consent obtained.  Discussed risks and benefits of procedure. Warned about infection, bleeding, hyperglycemia damage to structures among others. Patient expresses understanding and agreement Time-out conducted.   Noted no overlying erythema, induration, or other signs of local infection.   Skin prepped in a sterile fashion.   Local anesthesia: Topical Ethyl chloride.   With sterile technique and under real time ultrasound guidance: Zilretta 32 mg injected into knee joint. Fluid seen entering the joint capsule.    Completed without difficulty   Advised to call if fevers/chills, erythema, induration, drainage, or persistent bleeding.   Images permanently stored and available for review in the ultrasound unit.  Impression: Technically successful ultrasound guided injection.    Procedure: Real-time Ultrasound Guided Injection of left knee joint superior lateral patellar space Device: Philips Affiniti 50G Images permanently stored and available for review in PACS Verbal informed consent obtained.  Discussed risks and benefits of procedure. Warned about infection, bleeding, hyperglycemia damage to structures among others. Patient expresses understanding and agreement Time-out conducted.   Noted no overlying erythema, induration, or other signs of local infection.   Skin prepped in a sterile fashion.   Local anesthesia: Topical Ethyl chloride.   With sterile technique and under real time ultrasound guidance: Zilretta 32 mg injected into knee joint. Fluid seen entering the joint capsule.   Completed without difficulty   Advised to call if fevers/chills, erythema, induration, drainage, or persistent bleeding.   Images permanently stored and available for review in the ultrasound unit.  Impression: Technically successful ultrasound guided injection. Lot number: [id number] for both injections       Assessment and Plan: 57 y.o. female with bilateral knee pain due to exacerbation of underlying DJD.  Plan for Zilretta injection today.  She is already seen orthopedics and has been advised to proceed with knee replacement when the pain is bad enough and these shots are no longer working.  For now they are working okay.  Recommend repeating them  about every 4 months as needed.   PDMP not reviewed this encounter. Orders Placed This Encounter  Procedures   [place] LIMITED JOINT SPACE STRUCTURES LOW BILAT(NO LINKED CHARGES)    Order Specific Question:   Reason for Exam (SYMPTOM  OR DIAGNOSIS REQUIRED)    Answer:    bilateral knee pain    Order Specific Question:   Preferred imaging location?    Answer:   [facility] ordered this encounter  Medications   Triamcinolone Acetonide (ZILRETTA) intra-articular injection 32 mg   Triamcinolone Acetonide (ZILRETTA) intra-articular injection 32 mg     Discussed warning signs or symptoms. Please see discharge instructions. Patient expresses understanding.   The above documentation has been reviewed and is accurate and complete Clementeen Graham, M.D.

## 2022-10-26 ENCOUNTER — Other Ambulatory Visit: Payer: Self-pay

## 2022-10-26 ENCOUNTER — Ambulatory Visit: Payer: Managed Care, Other (non HMO) | Admitting: Family Medicine

## 2022-10-26 VITALS — BP 122/80 | HR 88 | Ht 62.0 in | Wt 154.0 lb

## 2022-10-26 DIAGNOSIS — M170 Bilateral primary osteoarthritis of knee: Secondary | ICD-10-CM

## 2022-10-26 DIAGNOSIS — G8929 Other chronic pain: Secondary | ICD-10-CM | POA: Diagnosis not present

## 2022-10-26 DIAGNOSIS — M25561 Pain in right knee: Secondary | ICD-10-CM

## 2022-10-26 DIAGNOSIS — M25562 Pain in left knee: Secondary | ICD-10-CM | POA: Diagnosis not present

## 2022-10-26 MED ORDER — TRIAMCINOLONE ACETONIDE 32 MG IX SRER
32.0000 mg | Freq: Once | INTRA_ARTICULAR | Status: AC
  Administered 2022-10-26: 32 mg via INTRA_ARTICULAR

## 2022-10-26 NOTE — Patient Instructions (Signed)
Thank you for coming in today.   You received an injection today. Seek immediate medical attention if the joint becomes red, extremely painful, or is oozing fluid.  

## 2022-12-25 ENCOUNTER — Encounter: Payer: Self-pay | Admitting: Family Medicine

## 2022-12-25 ENCOUNTER — Ambulatory Visit: Payer: Managed Care, Other (non HMO) | Admitting: Family Medicine

## 2022-12-25 VITALS — BP 122/84 | HR 77 | Temp 97.6°F | Ht 62.0 in | Wt 151.0 lb

## 2022-12-25 DIAGNOSIS — M8580 Other specified disorders of bone density and structure, unspecified site: Secondary | ICD-10-CM | POA: Diagnosis not present

## 2022-12-25 DIAGNOSIS — R350 Frequency of micturition: Secondary | ICD-10-CM | POA: Diagnosis not present

## 2022-12-25 DIAGNOSIS — R000 Tachycardia, unspecified: Secondary | ICD-10-CM

## 2022-12-25 DIAGNOSIS — R319 Hematuria, unspecified: Secondary | ICD-10-CM

## 2022-12-25 DIAGNOSIS — Z0001 Encounter for general adult medical examination with abnormal findings: Secondary | ICD-10-CM | POA: Diagnosis not present

## 2022-12-25 DIAGNOSIS — Z87891 Personal history of nicotine dependence: Secondary | ICD-10-CM | POA: Diagnosis not present

## 2022-12-25 DIAGNOSIS — Z136 Encounter for screening for cardiovascular disorders: Secondary | ICD-10-CM

## 2022-12-25 DIAGNOSIS — E7800 Pure hypercholesterolemia, unspecified: Secondary | ICD-10-CM | POA: Diagnosis not present

## 2022-12-25 LAB — CBC WITH DIFFERENTIAL/PLATELET
Basophils Absolute: 0.1 10*3/uL (ref 0.0–0.1)
Basophils Relative: 0.8 % (ref 0.0–3.0)
Eosinophils Absolute: 0.2 10*3/uL (ref 0.0–0.7)
Eosinophils Relative: 1.9 % (ref 0.0–5.0)
HCT: 42.1 % (ref 36.0–46.0)
Hemoglobin: 13.9 g/dL (ref 12.0–15.0)
Lymphocytes Relative: 32.9 % (ref 12.0–46.0)
Lymphs Abs: 2.7 10*3/uL (ref 0.7–4.0)
MCHC: 32.9 g/dL (ref 30.0–36.0)
MCV: 92.2 fl (ref 78.0–100.0)
Monocytes Absolute: 0.7 10*3/uL (ref 0.1–1.0)
Monocytes Relative: 8.4 % (ref 3.0–12.0)
Neutro Abs: 4.6 10*3/uL (ref 1.4–7.7)
Neutrophils Relative %: 56 % (ref 43.0–77.0)
Platelets: 387 10*3/uL (ref 150.0–400.0)
RBC: 4.57 Mil/uL (ref 3.87–5.11)
RDW: 14 % (ref 11.5–15.5)
WBC: 8.1 10*3/uL (ref 4.0–10.5)

## 2022-12-25 LAB — COMPREHENSIVE METABOLIC PANEL
ALT: 14 U/L (ref 0–35)
AST: 13 U/L (ref 0–37)
Albumin: 4.4 g/dL (ref 3.5–5.2)
Alkaline Phosphatase: 119 U/L — ABNORMAL HIGH (ref 39–117)
BUN: 14 mg/dL (ref 6–23)
CO2: 28 mEq/L (ref 19–32)
Calcium: 10.1 mg/dL (ref 8.4–10.5)
Chloride: 102 mEq/L (ref 96–112)
Creatinine, Ser: 0.79 mg/dL (ref 0.40–1.20)
GFR: 82.15 mL/min (ref >60.00)
Glucose, Bld: 87 mg/dL (ref 70–99)
Potassium: 3.6 mEq/L (ref 3.5–5.1)
Sodium: 141 mEq/L (ref 135–145)
Total Bilirubin: 0.6 mg/dL (ref 0.2–1.2)
Total Protein: 7 g/dL (ref 6.0–8.3)

## 2022-12-25 LAB — POC URINALSYSI DIPSTICK (AUTOMATED)
Bilirubin, UA: NEGATIVE
Blood, UA: POSITIVE
Glucose, UA: NEGATIVE
Ketones, UA: NEGATIVE
Leukocytes, UA: NEGATIVE
Nitrite, UA: NEGATIVE
Protein, UA: NEGATIVE
Spec Grav, UA: 1.015 (ref 1.010–1.025)
Urobilinogen, UA: 0.2 E.U./dL
pH, UA: 7.5 (ref 5.0–8.0)

## 2022-12-25 LAB — TSH: TSH: 0.65 u[IU]/mL (ref 0.35–5.50)

## 2022-12-25 LAB — LIPID PANEL
Cholesterol: 186 mg/dL (ref 0–200)
HDL: 57.1 mg/dL (ref >39.00)
LDL Cholesterol: 106 mg/dL — ABNORMAL HIGH (ref 0–99)
NonHDL: 128.52
Total CHOL/HDL Ratio: 3
Triglycerides: 112 mg/dL (ref 0.0–149.0)
VLDL: 22.4 mg/dL (ref 0.0–40.0)

## 2022-12-25 LAB — URINALYSIS, MICROSCOPIC ONLY: WBC, UA: NONE SEEN (ref 0–?)

## 2022-12-25 LAB — VITAMIN D 25 HYDROXY (VIT D DEFICIENCY, FRACTURES): VITD: 47.19 ng/mL (ref 30.00–100.00)

## 2022-12-25 NOTE — Assessment & Plan Note (Signed)
Urinalysis 1+ blood. Urine microscopy negative for RBCs. No further testing warranted.

## 2022-12-25 NOTE — Assessment & Plan Note (Signed)
Qualifies for LDCT. Referred to [facility] pulmonology

## 2022-12-25 NOTE — Assessment & Plan Note (Signed)
continue getting calcium and vitamin D and doing weight bearing exercises.  

## 2022-12-25 NOTE — Assessment & Plan Note (Signed)
UA negative for infection. Recommend doing kegels. Cut back on soda. F/u if worsening.

## 2022-12-25 NOTE — Assessment & Plan Note (Signed)
EKG unremarkable. She is asymptomatic. Discussed LDCT will give [place] more information regarding atherosclerosis. Continue statin therapy. Consider referral to cardiology.

## 2022-12-25 NOTE — Assessment & Plan Note (Signed)
Continue statin therapy. Check fasting lipids and follow up

## 2022-12-25 NOTE — Patient Instructions (Signed)
Please go downstairs for labs. We will be in touch with your results and recommendations.   Limit soda.   Do Kegel exercises.    Kegel Exercises  Kegel exercises can help strengthen your pelvic floor muscles. The pelvic floor is a group of muscles that support your rectum, small intestine, and bladder. In females, pelvic floor muscles also help support the uterus. These muscles help you control the flow of urine and stool (feces). Kegel exercises are painless and simple. They do not require any equipment. Your provider may suggest Kegel exercises to: Improve bladder and bowel control. Improve sexual response. Improve weak pelvic floor muscles after surgery to remove the uterus (hysterectomy) or after pregnancy, in females. Improve weak pelvic floor muscles after prostate gland removal or surgery, in males. Kegel exercises involve squeezing your pelvic floor muscles. These are the same muscles you squeeze when you try to stop the flow of urine or keep from passing gas. The exercises can be done while sitting, standing, or lying down, but it is best to vary your position. Ask your health care provider which exercises are safe for you. Do exercises exactly as told by your health care provider and adjust them as directed. Do not begin these exercises until told by your health care provider. Exercises How to do Kegel exercises: Squeeze your pelvic floor muscles tight. You should feel a tight lift in your rectal area. If you are a female, you should also feel a tightness in your vaginal area. Keep your stomach, buttocks, and legs relaxed. Hold the muscles tight for up to 10 seconds. Breathe normally. Relax your muscles for up to 10 seconds. Repeat as told by your health care provider. Repeat this exercise daily as told by your health care provider. Continue to do this exercise for at least 4-6 weeks, or for as long as told by your health care provider. You may be referred to a physical therapist  who can help you learn more about how to do Kegel exercises. Depending on your condition, your health care provider may recommend: Varying how long you squeeze your muscles. Doing several sets of exercises every day. Doing exercises for several weeks. Making Kegel exercises a part of your regular exercise routine. This information is not intended to replace advice given to you by your health care provider. Make sure you discuss any questions you have with your health care provider. Document Revised: 10/27/2020 Document Reviewed: 10/27/2020 Elsevier Patient Education  2024 [employer] Inc.     Cholesterol Content in Foods Cholesterol is a waxy, fat-like substance that helps to carry fat in the blood. The body needs cholesterol in small amounts, but too much cholesterol can cause damage to the arteries and heart. What foods have cholesterol?  Cholesterol is found in animal-based foods, such as meat, seafood, and dairy. Generally, low-fat dairy and lean meats have less cholesterol than full-fat dairy and fatty meats. The milligrams of cholesterol per serving (mg per serving) of common cholesterol-containing foods are listed below. Meats and other proteins Egg -- one large whole egg has 186 mg. Veal shank -- 4 oz (113 g) has 141 mg. Lean ground [place] (93% lean) -- 4 oz (113 g) has 118 mg. Fat-trimmed lamb loin -- 4 oz (113 g) has 106 mg. Lean ground beef (90% lean) -- 4 oz (113 g) has 100 mg. Lobster -- 3.5 oz (99 g) has 90 mg. Pork loin chops -- 4 oz (113 g) has 86 mg. Canned salmon -- 3.5 oz (99  g) has 83 mg. Fat-trimmed beef top loin -- 4 oz (113 g) has 78 mg. Frankfurter -- 1 frank (3.5 oz or 99 g) has 77 mg. Crab -- 3.5 oz (99 g) has 71 mg. Roasted chicken without skin, white meat -- 4 oz (113 g) has 66 mg. Light bologna -- 2 oz (57 g) has 45 mg. Deli-cut [place] -- 2 oz (57 g) has 31 mg. Canned tuna -- 3.5 oz (99 g) has 31 mg. Tomasa Blase -- 1 oz (28 g) has 29 mg. Oysters and mussels  (raw) -- 3.5 oz (99 g) has 25 mg. Mackerel -- 1 oz (28 g) has 22 mg. Trout -- 1 oz (28 g) has 20 mg. Pork sausage -- 1 link (1 oz or 28 g) has 17 mg. Salmon -- 1 oz (28 g) has 16 mg. Tilapia -- 1 oz (28 g) has 14 mg. Dairy Soft-serve ice cream --  cup (4 oz or 86 g) has 103 mg. Whole-milk yogurt -- 1 cup (8 oz or 245 g) has 29 mg. Cheddar cheese -- 1 oz (28 g) has 28 mg. American cheese -- 1 oz (28 g) has 28 mg. Whole milk -- 1 cup (8 oz or 250 mL) has 23 mg. 2% milk -- 1 cup (8 oz or 250 mL) has 18 mg. Cream cheese -- 1 tablespoon (Tbsp) (14.5 g) has 15 mg. Cottage cheese --  cup (4 oz or 113 g) has 14 mg. Low-fat (1%) milk -- 1 cup (8 oz or 250 mL) has 10 mg. Sour cream -- 1 Tbsp (12 g) has 8.5 mg. Low-fat yogurt -- 1 cup (8 oz or 245 g) has 8 mg. Nonfat Greek yogurt -- 1 cup (8 oz or 228 g) has 7 mg. Half-and-half cream -- 1 Tbsp (15 mL) has 5 mg. Fats and oils Cod liver oil -- 1 tablespoon (Tbsp) (13.6 g) has 82 mg. Butter -- 1 Tbsp (14 g) has 15 mg. Lard -- 1 Tbsp (12.8 g) has 14 mg. Bacon grease -- 1 Tbsp (12.9 g) has 14 mg. Mayonnaise -- 1 Tbsp (13.8 g) has 5-10 mg. Margarine -- 1 Tbsp (14 g) has 3-10 mg. The items listed above may not be a complete list of foods with cholesterol. Exact amounts of cholesterol in these foods may vary depending on specific ingredients and brands. Contact a dietitian for more information. What foods do not have cholesterol? Most plant-based foods do not have cholesterol unless you combine them with a food that has cholesterol. Foods without cholesterol include: Grains and cereals. Vegetables. Fruits. Vegetable oils, such as olive, canola, and sunflower oil. Legumes, such as peas, beans, and lentils. Nuts and seeds. Egg whites. The items listed above may not be a complete list of foods that do not have cholesterol. Contact a dietitian for more information. Summary The body needs cholesterol in small amounts, but too much cholesterol can  cause damage to the arteries and heart. Cholesterol is found in animal-based foods, such as meat, seafood, and dairy. Generally, low-fat dairy and lean meats have less cholesterol than full-fat dairy and fatty meats. This information is not intended to replace advice given to you by your health care provider. Make sure you discuss any questions you have with your health care provider. Document Revised: 10/28/2020 Document Reviewed: 10/28/2020 Elsevier Patient Education  2024 [employer].

## 2022-12-25 NOTE — Assessment & Plan Note (Signed)
Preventive health care reviewed. UTD with mammogram, pap smear, DEXA and colonoscopy.  Counseling on healthy lifestyle including diet and exercise.  Recommend regular dental and eye exams.  Immunizations reviewed.  Discussed safety.

## 2022-12-25 NOTE — Progress Notes (Signed)
Complete physical exam  Patient: Shawna Johnson   DOB: [birth year]   57 y.o. Female  MRN: [id number]  Subjective:    Chief Complaint  Patient presents with   Annual Exam    fasting   She is here for a complete physical exam. She has new concerns as well.   Other providers: Dr. Rhea Belton- GI  Dr. Langston Masker- OB/GYN  Dermatologist  Dentist  Eye doctor    States her pulse gets into the 140s during [place] training and in the 170s with vigorous activity. Recovery time is quick. Denies syncope, dizziness, chest pain, palpitations, DOE, N/V.  Resting pulse 72   Father died from a heart attack at age 84.   C/o urinary frequency. Good control of her bladder most of the time.  No dysuria. She drinks a lot of water and diet soda.    Covid booster Oct 6th 2023 at [facility]   Former smoker- quit 12 years ago 0.5ppd x 15 yrs   Last DEXA 07/2021- osteopenia    Health Maintenance  Topic Date Due   Pap Smear  12/10/2022   HIV Screening  12/29/2022*   COVID-19 Vaccine (11 - 2023-24 season) 01/10/2023*   Flu Shot  01/31/2023   Colon Cancer Screening  07/20/2024   Mammogram  08/29/2024   DTaP/Tdap/Td vaccine (2 - Td or Tdap) 10/23/2025   Hepatitis C Screening  Completed   Zoster (Shingles) Vaccine  Completed   HPV Vaccine  Aged Out  *Topic was postponed. The date shown is not the original due date.    Wears seatbelt always, uses sunscreen, smoke detectors in home and functioning, does not text while driving, feels safe in home environment.  Depression screening:    12/25/2022    9:56 AM 02/01/2022    1:44 PM 12/28/2021    9:46 AM  Depression screen PHQ 2/9  Decreased Interest 0 0 0  Down, Depressed, Hopeless 0 0 0  PHQ - 2 Score 0 0 0   Anxiety Screening:    07/12/2020   10:09 AM  GAD 7 : Generalized Anxiety Score  Nervous, Anxious, on Edge 0  Control/stop worrying 0  Worry too much - different things 0  Trouble relaxing 0  Restless 0  Easily annoyed or irritable 0  Afraid  - awful might happen 0  Total GAD 7 Score 0  Anxiety Difficulty Not difficult at all    Vision:Within last year and Dental: No current dental problems and Receives regular dental care  Patient Active Problem List   Diagnosis Date Noted   Former smoker 12/25/2022   Urinary frequency 12/25/2022   Encounter for general adult medical examination with abnormal findings 12/25/2022   Hematuria 12/25/2022   Exercise-induced tachycardia 12/25/2022   Situational hypertension 02/01/2022   Osteopenia 02/01/2022   Adhesive capsulitis of toe, left 09/05/2021   Mass of joint of right shoulder 07/14/2021   Subscapularis tendinitis, right 07/13/2021   COVID-19 05/22/2021   Primary osteoarthritis of both knees 07/21/2020   Chronic pain of right ankle 07/21/2020   Peptic ulcer 07/12/2020   Rotator cuff tendinitis, left 07/12/2020   Cervical cancer screening 12/14/2019   Palpitations 12/09/2018   Mixed conductive and sensorineural hearing loss of right ear with restricted hearing of left ear 01/30/2018   Perforation of right tympanic membrane 01/30/2018   Sensorineural hearing loss (SNHL) of left ear with restricted hearing of right ear 01/30/2018   Goiter, 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   Elevated alkaline phosphatase level 04/24/2016  Arthritis    Hepatic steatosis 08/23/2014   Multiple thyroid nodules  see U/S 2015 mm sized in left lobe 05/16/2014   Breast calcifications on mammogram  R breast stable for 18 months [facility] 01/07/2014   Breast nodule  decreasing size mm 09/2013 [facility] 01/07/2014   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   Abnormal Pap smear of cervix  S/P Leep normal pap since 2007 12/22/2013   Hyperlipidemia 12/22/2013   Past Medical History:  Diagnosis Date   Abnormal alkaline phosphatase test 04/24/2016   Arthritis    Elevated alkaline phosphatase level    Encounter for mastoidectomy cavity debridement, right 07/16/2017   Esophageal stenosis    Fatty liver     Gastric ulcer    Goiter, 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   Hyperlipidemia    Preventative health care 10/24/2015   Past Surgical History:  Procedure Laterality Date   CERVICAL CONIZATION W/BX  05/2006   COSMETIC SURGERY     eyelid   INNER EAR SURGERY     several childhood   TONSILLECTOMY AND ADENOIDECTOMY     Social History   Tobacco Use   Smoking status: Former    Packs/day: 0.50    Years: 30.00    Additional pack years: 0.00    Total pack years: 15.00    Types: Cigarettes, E-cigarettes    Quit date: 07/02/2013    Years since quitting: 9.4   Smokeless tobacco: Former    Quit date: 04/24/2016  Vaping Use   Vaping Use: Never used  Substance Use Topics   Alcohol use: Yes    Alcohol/week: 4.0 standard drinks of alcohol    Types: 4 Glasses of wine per week    Comment: weekly   Drug use: No      Patient Care Team: Avanell Shackleton, NP-C as PCP - General (Family Medicine) [place]   Outpatient Medications Prior to Visit  Medication Sig   atorvastatin (LIPITOR) 20 MG tablet TAKE 1 TABLET DAILY. Must see provider for further refills.   calcium carbonate (OS-CAL) 1250 (500 Ca) MG chewable tablet Chew 2 tablets by mouth daily.   KRILL OIL PO Take by mouth.   pantoprazole (PROTONIX) 40 MG tablet TAKE 1 TABLET(40 MG) BY MOUTH DAILY   vitamin C (ASCORBIC ACID) 250 MG tablet Take 250 mg by mouth daily.   No facility-administered medications prior to visit.    Review of Systems  Constitutional:  Negative for chills, fever and malaise/fatigue.  HENT:  Negative for congestion, ear pain, sinus pain and sore throat.   Eyes:  Negative for blurred vision, double vision and pain.  Respiratory:  Negative for cough, shortness of breath and wheezing.   Cardiovascular:  Negative for chest pain, palpitations and leg swelling.  Gastrointestinal:  Negative for abdominal pain, constipation, diarrhea, nausea and vomiting.  Genitourinary:  Positive for frequency. Negative  for dysuria and urgency.  Musculoskeletal:  Negative for back pain, joint pain and myalgias.  Skin:  Negative for rash.  Neurological:  Negative for dizziness, tingling, focal weakness and headaches.  Psychiatric/Behavioral:  Negative for depression and memory loss. The patient is not nervous/anxious.        Objective:    BP 122/84 (BP Location: Left Arm, Patient Position: Sitting, Cuff Size: Large)   Pulse 77   Temp 97.6 F (36.4 C) (Temporal)   Ht 5\' 2"  (1.575 m)   Wt 151 lb (68.5 kg)   LMP 07/02/2013   SpO2 98%   BMI  27.62 kg/m  BP Readings from Last 3 Encounters:  12/25/22 122/84  10/26/22 122/80  09/07/22 122/80   Wt Readings from Last 3 Encounters:  12/25/22 151 lb (68.5 kg)  10/26/22 154 lb (69.9 kg)  09/07/22 154 lb (69.9 kg)    Physical Exam Constitutional:      General: She is not in acute distress.    Appearance: She is not ill-appearing.  HENT:     Right Ear: Ear canal and external ear normal.     Left Ear: Tympanic membrane, ear canal and external ear normal.     Nose: Nose normal.     Mouth/Throat:     Mouth: Mucous membranes are moist.     Pharynx: Oropharynx is clear.  Eyes:     Extraocular Movements: Extraocular movements intact.     Conjunctiva/sclera: Conjunctivae normal.     Pupils: Pupils are equal, round, and reactive to light.  Neck:     Thyroid: No thyroid mass, thyromegaly or thyroid tenderness.     Vascular: No carotid bruit.  Cardiovascular:     Rate and Rhythm: Normal rate and regular rhythm.     Pulses: Normal pulses.     Heart sounds: Normal heart sounds.     Comments: EKG NSR, rate 62, no Q waves or LVH. Unchanged from old EKG  Pulmonary:     Effort: Pulmonary effort is normal.     Breath sounds: Normal breath sounds.  Abdominal:     General: Bowel sounds are normal.     Palpations: Abdomen is soft.     Tenderness: There is no abdominal tenderness. There is no right CVA tenderness, left CVA tenderness, guarding or rebound.   Musculoskeletal:        General: Normal range of motion.     Cervical back: Normal range of motion and neck supple. No tenderness.     Right lower leg: No edema.     Left lower leg: No edema.  Lymphadenopathy:     Cervical: No cervical adenopathy.  Skin:    General: Skin is warm and dry.     Findings: No lesion or rash.  Neurological:     General: No focal deficit present.     Mental Status: She is alert and oriented to person, place, and time.     Cranial Nerves: No cranial nerve deficit.     Sensory: No sensory deficit.     Motor: No weakness.     Gait: Gait normal.  Psychiatric:        Mood and Affect: Mood normal.        Behavior: Behavior normal.        Thought Content: Thought content normal.      Results for orders placed or performed in visit on 12/25/22  Urine Microscopic Only  Result Value Ref Range   WBC, UA none seen 0-2/hpf   RBC / HPF 0-2/hpf 0-2/hpf   Mucus, UA Presence of (A) None   Squamous Epithelial / HPF Rare(0-4/hpf) Rare(0-4/hpf)   Amorphous Present (A) None;Present  Lipid panel  Result Value Ref Range   Cholesterol 186 0 - 200 mg/dL   Triglycerides 657.8 0.0 - 149.0 mg/dL   HDL 46.96 >29.52 mg/dL   VLDL 84.1 0.0 - 32.4 mg/dL   LDL Cholesterol 401 (H) 0 - 99 mg/dL   Total CHOL/HDL Ratio 3    NonHDL 128.52   VITAMIN D 25 Hydroxy (Vit-D Deficiency, Fractures)  Result Value Ref Range   VITD 47.19 30.00 -  100.00 ng/mL  TSH  Result Value Ref Range   TSH 0.65 0.35 - 5.50 uIU/mL  Comprehensive metabolic panel  Result Value Ref Range   Sodium 141 135 - 145 mEq/L   Potassium 3.6 3.5 - 5.1 mEq/L   Chloride 102 96 - 112 mEq/L   CO2 28 19 - 32 mEq/L   Glucose, Bld 87 70 - 99 mg/dL   BUN 14 6 - 23 mg/dL   Creatinine, Ser 9.52 0.40 - 1.20 mg/dL   Total Bilirubin 0.6 0.2 - 1.2 mg/dL   Alkaline Phosphatase 119 (H) 39 - 117 U/L   AST 13 0 - 37 U/L   ALT 14 0 - 35 U/L   Total Protein 7.0 6.0 - 8.3 g/dL   Albumin 4.4 3.5 - 5.2 g/dL   GFR 84.13 >24.40  mL/min   Calcium 10.1 8.4 - 10.5 mg/dL  CBC with Differential/Platelet  Result Value Ref Range   WBC 8.1 4.0 - 10.5 K/uL   RBC 4.57 3.87 - 5.11 Mil/uL   Hemoglobin 13.9 12.0 - 15.0 g/dL   HCT 10.2 72.5 - 36.6 %   MCV 92.2 78.0 - 100.0 fl   MCHC 32.9 30.0 - 36.0 g/dL   RDW 44.0 34.7 - 42.5 %   Platelets 387.0 150.0 - 400.0 K/uL   Neutrophils Relative % 56.0 43.0 - 77.0 %   Lymphocytes Relative 32.9 12.0 - 46.0 %   Monocytes Relative 8.4 3.0 - 12.0 %   Eosinophils Relative 1.9 0.0 - 5.0 %   Basophils Relative 0.8 0.0 - 3.0 %   Neutro Abs 4.6 1.4 - 7.7 K/uL   Lymphs Abs 2.7 0.7 - 4.0 K/uL   Monocytes Absolute 0.7 0.1 - 1.0 K/uL   Eosinophils Absolute 0.2 0.0 - 0.7 K/uL   Basophils Absolute 0.1 0.0 - 0.1 K/uL  POCT Urinalysis Dipstick (Automated)  Result Value Ref Range   Color, UA pale yellow    Clarity, UA clear    Glucose, UA Negative Negative   Bilirubin, UA negative    Ketones, UA negative    Spec Grav, UA 1.015 1.010 - 1.025   Blood, UA positive    pH, UA 7.5 5.0 - 8.0   Protein, UA Negative Negative   Urobilinogen, UA 0.2 0.2 or 1.0 E.U./dL   Nitrite, UA negative    Leukocytes, UA Negative Negative      Assessment & Plan:    Routine Health Maintenance and Physical Exam  Problem List Items Addressed This Visit       Musculoskeletal and Integument   Osteopenia    continue getting calcium and vitamin D and doing weight bearing exercises.       Relevant Orders   VITAMIN D 25 Hydroxy (Vit-D Deficiency, Fractures) (Completed)     Other   Encounter for general adult medical examination with abnormal findings - Primary    Preventive health care reviewed. UTD with mammogram, pap smear, DEXA and colonoscopy.  Counseling on healthy lifestyle including diet and exercise.  Recommend regular dental and eye exams.  Immunizations reviewed.  Discussed safety.       Exercise-induced tachycardia    EKG unremarkable. She is asymptomatic. Discussed LDCT will give [place] more  information regarding atherosclerosis. Continue statin therapy. Consider referral to cardiology.       Relevant Orders   CBC with Differential/Platelet (Completed)   Comprehensive metabolic panel (Completed)   TSH (Completed)   Former smoker    Qualifies for LDCT. Referred to [facility] pulmonology  Relevant Orders   Ambulatory Referral Lung Cancer Screening [facility]   Hematuria    Urinalysis 1+ blood. Urine microscopy negative for RBCs. No further testing warranted.       Relevant Orders   Urine Microscopic Only (Completed)   Hyperlipidemia    Continue statin therapy. Check fasting lipids and follow up      Relevant Orders   Lipid panel (Completed)   Urinary frequency    UA negative for infection. Recommend doing kegels. Cut back on soda. F/u if worsening.       Relevant Orders   POCT Urinalysis Dipstick (Automated) (Completed)   Urine Microscopic Only (Completed)   Other Visit Diagnoses     Screening for ischemic heart disease       Relevant Orders   EKG 12-Lead (Completed)       Return in about 1 year (around 12/25/2023).     Hetty Blend, NP-C

## 2023-01-01 ENCOUNTER — Encounter: Payer: Self-pay | Admitting: Family Medicine

## 2023-01-07 ENCOUNTER — Other Ambulatory Visit: Payer: Self-pay | Admitting: Emergency Medicine

## 2023-01-07 DIAGNOSIS — Z122 Encounter for screening for malignant neoplasm of respiratory organs: Secondary | ICD-10-CM

## 2023-01-07 DIAGNOSIS — Z87891 Personal history of nicotine dependence: Secondary | ICD-10-CM

## 2023-01-09 ENCOUNTER — Other Ambulatory Visit: Payer: Self-pay | Admitting: Family Medicine

## 2023-01-09 DIAGNOSIS — E7800 Pure hypercholesterolemia, unspecified: Secondary | ICD-10-CM

## 2023-01-10 ENCOUNTER — Telehealth: Payer: Self-pay | Admitting: Family Medicine

## 2023-01-10 DIAGNOSIS — E7800 Pure hypercholesterolemia, unspecified: Secondary | ICD-10-CM

## 2023-01-10 MED ORDER — ATORVASTATIN CALCIUM 20 MG PO TABS
ORAL_TABLET | ORAL | 1 refills | Status: DC
Start: 2023-01-10 — End: 2023-07-09

## 2023-01-10 NOTE — Telephone Encounter (Signed)
Prescription Request  01/10/2023  LOV: 12/25/2022  What is the name of the medication or equipment?  atorvastatin (LIPITOR) 20 MG tablet [   Have you contacted your pharmacy to request a refill? No   Which pharmacy would you like this sent to?    [facility] - [place], [place] - [address] [place] [address] Phone: [phone] Fax: [phone] Patient notified that their request is being sent to the clinical staff for review and that they should receive a response within 2 business days.   Please advise at Mobile [phone] (mobile)

## 2023-01-10 NOTE — Telephone Encounter (Signed)
Rx sent 

## 2023-01-16 ENCOUNTER — Telehealth: Payer: Self-pay | Admitting: Family Medicine

## 2023-01-16 NOTE — Telephone Encounter (Signed)
Scheduled pt for 8/9 bil knees Zilretta.  Per pt request, please run for Zilretta. She expects her primary will deny but her secondary will pay.

## 2023-01-17 NOTE — Telephone Encounter (Signed)
VOB initiated for ZILRETTA for BILAT knee OA.

## 2023-01-17 NOTE — Telephone Encounter (Signed)
I only see [employer] listed under insurance, do you know what the secondary insurance is?

## 2023-01-21 NOTE — Telephone Encounter (Signed)
Prior Auth required for [facility] for BILAT knee OA.

## 2023-01-23 NOTE — Telephone Encounter (Addendum)
Prior auth initiated for Zilretta for BILAT knee OA via [employer]  Prior Auth (EOC) ID:  [id number]

## 2023-01-28 DIAGNOSIS — H2513 Age-related nuclear cataract, bilateral: Secondary | ICD-10-CM | POA: Insufficient documentation

## 2023-01-29 NOTE — Telephone Encounter (Signed)
Request received for additional information. Complete form and fax back to [place] at [phone].

## 2023-01-31 NOTE — Telephone Encounter (Signed)
Form completed again and re-faxed.

## 2023-02-05 NOTE — Telephone Encounter (Signed)
Coverage for Zilretta DENIED.  Shawna Johnson will only approve once per knee, per lifetime.

## 2023-02-08 ENCOUNTER — Ambulatory Visit (INDEPENDENT_AMBULATORY_CARE_PROVIDER_SITE_OTHER): Payer: Managed Care, Other (non HMO) | Admitting: Family Medicine

## 2023-02-08 ENCOUNTER — Other Ambulatory Visit: Payer: Self-pay

## 2023-02-08 VITALS — BP 122/84 | HR 77 | Ht 62.0 in | Wt 148.0 lb

## 2023-02-08 DIAGNOSIS — M170 Bilateral primary osteoarthritis of knee: Secondary | ICD-10-CM

## 2023-02-08 DIAGNOSIS — M25562 Pain in left knee: Secondary | ICD-10-CM | POA: Diagnosis not present

## 2023-02-08 DIAGNOSIS — M25561 Pain in right knee: Secondary | ICD-10-CM | POA: Diagnosis not present

## 2023-02-08 DIAGNOSIS — G8929 Other chronic pain: Secondary | ICD-10-CM | POA: Diagnosis not present

## 2023-02-08 MED ORDER — TRIAMCINOLONE ACETONIDE 32 MG IX SRER
32.0000 mg | Freq: Once | INTRA_ARTICULAR | Status: AC
  Administered 2023-02-08: 32 mg via INTRA_ARTICULAR

## 2023-02-08 NOTE — Telephone Encounter (Signed)
Spoke with pt to confirm that she was aware that Rosann Auerbach has denied coverage for Zilretta.   Pt is addiment that her secondary insurance, managed by her husband, will cover the cost. She does not have any information on the secondary insurance and says that we do not need to have it on file.

## 2023-02-08 NOTE — Telephone Encounter (Signed)
Discussed with pt at registration. Since [employer] denied the Zilretta, we [employer] as they will deny/write off. Pt aware she was checked in as "do not bill insurance" and her husband will file claim with separate employer plan.

## 2023-02-08 NOTE — Patient Instructions (Signed)
Thank you for coming in today.   Call or go to the ER if you develop a large red swollen joint with extreme pain or oozing puss.    Let me know how this goes.

## 2023-02-08 NOTE — Progress Notes (Signed)
  Zilretta injection bilateral knee Procedure: Real-time Ultrasound Guided Injection of right knee joint superior lateral patellar space Device: Philips Affiniti 50G Images permanently stored and available for review in PACS Verbal informed consent obtained.  Discussed risks and benefits of procedure. Warned about infection, hyperglycemia bleeding, damage to structures among others. Patient expresses understanding and agreement Time-out conducted.   Noted no overlying erythema, induration, or other signs of local infection.   Skin prepped in a sterile fashion.   Local anesthesia: Topical Ethyl chloride.   With sterile technique and under real time ultrasound guidance: Zilretta 32 mg injected into knee joint. Fluid seen entering the joint capsule.   Completed without difficulty   Advised to call if fevers/chills, erythema, induration, drainage, or persistent bleeding.   Images permanently stored and available for review in the ultrasound unit.  Impression: Technically successful ultrasound guided injection.  Procedure: Real-time Ultrasound Guided Injection of left knee joint superior lateral patellar space Device: Philips Affiniti 50G Images permanently stored and available for review in PACS Verbal informed consent obtained.  Discussed risks and benefits of procedure. Warned about infection, hyperglycemia bleeding, damage to structures among others. Patient expresses understanding and agreement Time-out conducted.   Noted no overlying erythema, induration, or other signs of local infection.   Skin prepped in a sterile fashion.   Local anesthesia: Topical Ethyl chloride.   With sterile technique and under real time ultrasound guidance: Zilretta 32 mg injected into knee joint. Fluid seen entering the joint capsule.   Completed without difficulty   Advised to call if fevers/chills, erythema, induration, drainage, or persistent bleeding.   Images permanently stored and available for review in  the ultrasound unit.  Impression: Technically successful ultrasound guided injection.  Lot number: [id number] both injections  We discussed ahead of time issues with her insurance and billing Zilretta.  She understands that she will need to file the Zilretta through her coinsurance.  She is getting an MRI of her knee with orthopedic surgeon in the near future.  We talked about knee replacements at some point.

## 2023-02-14 NOTE — Telephone Encounter (Signed)
Pt received Zilretta injections for BILAT knee OA on 02/08/23.  Can consider repeat injections on or after 05/04/23 (not covered by [place]).

## 2023-02-20 ENCOUNTER — Ambulatory Visit: Payer: Managed Care, Other (non HMO) | Admitting: Primary Care

## 2023-02-20 ENCOUNTER — Encounter: Payer: Managed Care, Other (non HMO) | Admitting: Acute Care

## 2023-02-20 ENCOUNTER — Ambulatory Visit (HOSPITAL_BASED_OUTPATIENT_CLINIC_OR_DEPARTMENT_OTHER): Payer: Managed Care, Other (non HMO)

## 2023-02-20 ENCOUNTER — Ambulatory Visit
Admission: RE | Admit: 2023-02-20 | Discharge: 2023-02-20 | Disposition: A | Discharge status: Home / Self Care | Payer: Managed Care, Other (non HMO) | Source: Ambulatory Visit | Attending: Acute Care | Admitting: Acute Care

## 2023-02-20 ENCOUNTER — Encounter: Payer: Self-pay | Admitting: Primary Care

## 2023-02-20 DIAGNOSIS — Z122 Encounter for screening for malignant neoplasm of respiratory organs: Secondary | ICD-10-CM

## 2023-02-20 DIAGNOSIS — Z87891 Personal history of nicotine dependence: Secondary | ICD-10-CM

## 2023-02-20 NOTE — Patient Instructions (Signed)

## 2023-02-20 NOTE — Progress Notes (Addendum)
Virtual Visit via Telephone Note  I connected with Shawna Johnson on 02/20/23 at  8:30 AM EDT by telephone and verified that I am speaking with the correct person using two identifiers.  Location: Patient: Home Provider: Office    I discussed the limitations, risks, security and privacy concerns of performing an evaluation and management service by telephone and the availability of in person appointments. I also discussed with the patient that there may be a patient responsible charge related to this service. The patient expressed understanding and agreed to proceed.   Shared Decision Making Visit Lung Cancer Screening Program [phone])   Eligibility: Age 57 y.o. Pack Years Smoking History Calculation 24.75 (# packs/per year x # years smoked) Recent History of coughing up blood  no Unexplained weight loss? no ( >Than 15 pounds within the last 6 months ) Prior History Lung / other cancer no (Diagnosis within the last 5 years already requiring surveillance chest CT Scans). Smoking Status Former smoker  Former Smokers: Years since quit: 10  Quit Date: 2014  Visit Components: Discussion included one or more decision making aids. yes Discussion included risk/benefits of screening. yes Discussion included potential follow up diagnostic testing for abnormal scans. yes Discussion included meaning and risk of over diagnosis. yes Discussion included meaning and risk of False Positives. yes Discussion included meaning of total radiation exposure. yes  Counseling Included: Importance of adherence to annual lung cancer LDCT screening. yes Impact of comorbidities on ability to participate in the program. yes Ability and willingness to under diagnostic treatment. yes  Smoking Cessation Counseling: Current Smokers:  Discussed importance of smoking cessation. yes Information about tobacco cessation classes and interventions provided to patient. yes Patient provided with "ticket" for LDCT  Scan. NA Symptomatic Patient. no  Counseling(Intermediate counseling: > three minutes) 99406 Diagnosis Code: Tobacco Use Z72.0 Asymptomatic Patient yes  Counseling (Intermediate counseling: > three minutes counseling) A2130 Former Smokers:  Discussed the importance of maintaining cigarette abstinence. yes Diagnosis Code: Personal History of Nicotine Dependence. Q65.784 Information about tobacco cessation classes and interventions provided to patient. Yes Patient provided with "ticket" for LDCT Scan. NA Written Order for Lung Cancer Screening with LDCT placed in Epic. Yes (CT Chest Lung Cancer Screening Low Dose W/O CM) [id number] Z12.2-Screening of respiratory organs Z87.891-Personal history of nicotine dependence  I have spent 25 minutes of face to face/ virtual visit   time with Ms Haugh discussing the risks and benefits of lung cancer screening. We viewed / discussed a power point together that explained in detail the above noted topics. We paused at intervals to allow for questions to be asked and answered to ensure understanding.We discussed that the single most powerful action that she can take to decrease her risk of developing lung cancer is to quit smoking. We discussed whether or not she is ready to commit to setting a quit date. We discussed options for tools to aid in quitting smoking including nicotine replacement therapy, non-nicotine medications, support groups, Quit Smart classes, and behavior modification. We discussed that often times setting smaller, more achievable goals, such as eliminating 1 cigarette a day for a week and then 2 cigarettes a day for a week can be helpful in slowly decreasing the number of cigarettes smoked. This allows for a sense of accomplishment as well as providing a clinical benefit. I provided  her  with smoking cessation  information  with contact information for community resources, classes, free nicotine replacement therapy, and access to mobile apps,  text  messaging, and on-line smoking cessation help. I have also provided  her  the office contact information in the event she needs to contact me, or the screening staff. We discussed the time and location of the scan, and that either Abigail Miyamoto RN, Karlton Lemon, RN  or I will call / send a letter with the results within 24-72 hours of receiving them. The patient verbalized understanding of all of  the above and had no further questions upon leaving the office. They have my contact information in the event they have any further questions.  I spent 3-5 minutes counseling on smoking cessation and the health risks of continued tobacco abuse.  I explained to the patient that there has been a high incidence of coronary artery disease noted on these exams. I explained that this is a non-gated exam therefore degree or severity cannot be determined. This patient is on statin therapy. I have asked the patient to follow-up with their PCP regarding any incidental finding of coronary artery disease and management with diet or medication as their PCP  feels is clinically indicated. The patient verbalized understanding of the above and had no further questions upon completion of the visit.     Glenford Bayley, NP

## 2023-02-25 ENCOUNTER — Ambulatory Visit: Payer: Managed Care, Other (non HMO) | Admitting: Family Medicine

## 2023-03-01 ENCOUNTER — Other Ambulatory Visit: Payer: Self-pay | Admitting: Acute Care

## 2023-03-01 DIAGNOSIS — Z87891 Personal history of nicotine dependence: Secondary | ICD-10-CM

## 2023-03-01 DIAGNOSIS — Z122 Encounter for screening for malignant neoplasm of respiratory organs: Secondary | ICD-10-CM

## 2023-03-14 ENCOUNTER — Encounter: Payer: Self-pay | Admitting: Family Medicine

## 2023-03-14 ENCOUNTER — Ambulatory Visit: Payer: Managed Care, Other (non HMO) | Admitting: Family Medicine

## 2023-03-14 VITALS — BP 114/78 | HR 65 | Temp 97.9°F | Wt 148.6 lb

## 2023-03-14 DIAGNOSIS — I517 Cardiomegaly: Secondary | ICD-10-CM

## 2023-03-14 MED ORDER — MUPIROCIN 2 % EX OINT: 1.0000 | TOPICAL_OINTMENT | Freq: Two times a day (BID) | CUTANEOUS | 0 refills | Status: DC

## 2023-03-14 NOTE — Progress Notes (Signed)
Subjective:     Patient ID: Shawna Johnson, female    DOB: 1966-06-10, 57 y.o.   MRN: [id number]  No chief complaint on file.   HPI  Discussed the use of AI scribe software for clinical note transcription with the patient, who gave verbal consent to proceed.  History of Present Illness         Here to follow up on abnormal finding of enlarged heart on CT for lung cancer screening.   Exercises daily. No chest pain, palpitations, DOE.  Her pulse jumps up to 160s-170 but returns to normal quickly.    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   Hyperlipidemia    Preventative health care 10/24/2015    Past Surgical History:  Procedure Laterality Date   CERVICAL CONIZATION W/BX  05/2006   COSMETIC SURGERY     eyelid   INNER EAR SURGERY     several childhood   TONSILLECTOMY AND ADENOIDECTOMY      Family History  Problem Relation Age of Onset   Arthritis Mother    High Cholesterol Mother    High blood pressure Mother    Heart attack Father    Hyperlipidemia Father    Hypertension Father    Sudden death Father    Heart disease Father        MI   Alcohol abuse Father    COPD Father    Early death Father    Congestive Heart Failure Sister    COPD Sister    Heart disease Sister        has pacer/defib in place    Cardiomyopathy Sister    Drug abuse Sister        crack in past   High Cholesterol Sister    High blood pressure Sister    Kidney disease Sister    Learning disabilities Sister    Mental illness Maternal Grandfather        suicide   Depression Maternal Grandfather    Heart disease Paternal Grandmother    High Cholesterol  Paternal Grandmother    Heart disease Paternal Grandfather    High Cholesterol Paternal Grandfather    Breast cancer Maternal Aunt    Melanoma Maternal Aunt    Diabetes Maternal Aunt    Colon cancer Neg Hx    Esophageal cancer Neg Hx    Stomach cancer Neg Hx    Rectal cancer Neg Hx     Social History   Socioeconomic History   Marital status: Married    Spouse name: Not on file   Number of children: 1   Years of education: Not on file   Highest education level: Associate degree: occupational, Scientist, product/process development, or vocational program  Occupational History   Occupation: Stylist  Tobacco Use   Smoking status: Former    Current packs/day: 0.00    Average packs/day: 0.5 packs/day for 30.0 years (15.0 ttl pk-yrs)    Types: Cigarettes, E-cigarettes    Start date: 07/03/1983    Quit date: 07/02/2013    Years since quitting: 9.7   Smokeless tobacco: Former    Quit date: 04/24/2016  Tobacco comments:    Reported smoking history:     Years smoked: 33     Average PPD: 3/4     Pack years = 24.75      She quit smoking in 2015  Vaping Use   Vaping status: Never Used  Substance and Sexual Activity   Alcohol use: Yes    Alcohol/week: 4.0 standard drinks of alcohol    Types: 4 Glasses of wine per week    Comment: weekly   Drug use: No   Sexual activity: Yes    Partners: Male    Birth control/protection: Post-menopausal    Comment: lives with husband, no dietary restrictions, works as Producer, television/film/video, 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  Other Topics Concern   Not on file  Social History Narrative   Not on file   Social Determinants of Health   Financial Resource Strain: Low Risk  (02/25/2023)   Overall Financial Resource Strain (CARDIA)    Difficulty of Paying Living Expenses: Not hard at all  Food Insecurity: No Food Insecurity (02/25/2023)   Hunger Vital Sign    Worried About Running Out of Food in the Last Year: Never true    Ran Out of Food in the Last Year: Never true  Transportation Needs: No  Transportation Needs (02/25/2023)   PRAPARE - Administrator, Civil Service (Medical): No    Lack of Transportation (Non-Medical): No  Physical Activity: Sufficiently Active (02/25/2023)   Exercise Vital Sign    Days of Exercise per Week: 5 days    Minutes of Exercise per Session: 60 min  Stress: No Stress Concern Present (02/25/2023)   [employer] of Occupational Health - Occupational Stress Questionnaire    Feeling of Stress : Not at all  Social Connections: Socially Integrated (02/25/2023)   Social Connection and Isolation Panel [NHANES]    Frequency of Communication with Friends and Family: More than three times a week    Frequency of Social Gatherings with Friends and Family: More than three times a week    Attends Religious Services: 1 to 4 times per year    Active Member of [employer] or Organizations: Yes    Attends Engineer, structural: More than 4 times per year    Marital Status: Married  Catering manager Violence: Not At Risk (08/04/2022)   Received from [employer], [facility]   HITS    Over the last 12 months how often did your partner physically hurt you?: 1    Over the last 12 months how often did your partner insult you or talk down to you?: 1    Over the last 12 months how often did your partner threaten you with physical harm?: 1    Over the last 12 months how often did your partner scream or curse at you?: 1    Outpatient Medications Prior to Visit  Medication Sig Dispense Refill   atorvastatin (LIPITOR) 20 MG tablet TAKE 1 TABLET DAILY. 90 tablet 1   calcium carbonate (OS-CAL) 1250 (500 Ca) MG chewable tablet Chew 2 tablets by mouth daily.     KRILL OIL PO Take by mouth.     pantoprazole (PROTONIX) 40 MG tablet TAKE 1 TABLET(40 MG) BY MOUTH DAILY 90 tablet 3   vitamin C (ASCORBIC ACID) 250 MG tablet Take 250 mg by mouth daily.     No facility-administered medications prior to visit.    Allergies  Allergen Reactions   Misc. Sulfonamide  Containing Compounds Swelling  Sulfa Antibiotics Swelling    Review of Systems  Constitutional:  Negative for chills, fever and malaise/fatigue.  Respiratory:  Negative for shortness of breath.   Cardiovascular:  Negative for chest pain, palpitations and leg swelling.  Gastrointestinal:  Negative for abdominal pain, constipation, diarrhea, nausea and vomiting.  Neurological:  Negative for dizziness and focal weakness.       Objective:    Physical Exam Constitutional:      General: She is not in acute distress.    Appearance: She is not ill-appearing.  Eyes:     Extraocular Movements: Extraocular movements intact.     Conjunctiva/sclera: Conjunctivae normal.  Cardiovascular:     Rate and Rhythm: Normal rate.  Pulmonary:     Effort: Pulmonary effort is normal.  Musculoskeletal:     Cervical back: Normal range of motion and neck supple.     Right lower leg: No edema.     Left lower leg: No edema.  Skin:    General: Skin is warm and dry.  Neurological:     General: No focal deficit present.     Mental Status: She is alert and oriented to person, place, and time.  Psychiatric:        Mood and Affect: Mood normal.        Behavior: Behavior normal.        Thought Content: Thought content normal.      BP 114/78 (BP Location: Left Arm, Patient Position: Sitting, Cuff Size: Large)   Pulse 65   Temp 97.9 F (36.6 C) (Oral)   Wt 148 lb 9.6 oz (67.4 kg)   LMP 07/02/2013   SpO2 96%   BMI 27.18 kg/m  Wt Readings from Last 3 Encounters:  03/14/23 148 lb 9.6 oz (67.4 kg)  02/08/23 148 lb (67.1 kg)  12/25/22 151 lb (68.5 kg)       Assessment & Plan:   Problem List Items Addressed This Visit   None Visit Diagnoses     Cardiomegaly    -  Primary   Relevant Orders   ECHOCARDIOGRAM COMPLETE      Incidental finding of enlarged heart on CT for lung cancer screening.  Echocardiogram ordered.   I am having [place] A. [place] "Ginger" start on mupirocin ointment. I am  also having her maintain her calcium carbonate, vitamin C, KRILL OIL PO, pantoprazole, and atorvastatin.  Meds ordered this encounter  Medications   mupirocin ointment (BACTROBAN) 2 %    Sig: Apply 1 Application topically 2 (two) times daily.    Dispense:  22 g    Refill:  0    Order Specific Question:   Supervising Provider    Answer:   Hillard Danker A [[id number]]

## 2023-03-14 NOTE — Patient Instructions (Signed)
Continue taking good care of yourself

## 2023-03-29 ENCOUNTER — Ambulatory Visit (HOSPITAL_COMMUNITY): Payer: Managed Care, Other (non HMO)

## 2023-04-09 ENCOUNTER — Encounter: Payer: Self-pay | Admitting: Family Medicine

## 2023-04-11 NOTE — Telephone Encounter (Signed)
Yes I am ok doing the Zilretta injection early.  Your surgeon will generally not want to do a knee replacement within 3 months of the Zilretta injection. Make sure he is aware of your plans.   As for the billing I will ask.   Clayburn Pert

## 2023-04-12 ENCOUNTER — Ambulatory Visit (HOSPITAL_COMMUNITY): Payer: Managed Care, Other (non HMO) | Attending: Family Medicine

## 2023-04-12 DIAGNOSIS — I517 Cardiomegaly: Secondary | ICD-10-CM | POA: Insufficient documentation

## 2023-04-12 LAB — ECHOCARDIOGRAM COMPLETE
Area-P 1/2: 3.7 cm2
S' Lateral: 3.5 cm

## 2023-04-18 NOTE — Telephone Encounter (Signed)
VOB initiated for Zilretta for BILAT knee OA 

## 2023-04-19 NOTE — Telephone Encounter (Signed)
We can proceed with Zilretta injection at any point. Pt will need to sign an ABN to be scanned to her chart and we will need to use the GA modifier when billing out to acknowledge the ABN.

## 2023-04-23 NOTE — Telephone Encounter (Signed)
Checking pharmacy benefits

## 2023-04-23 NOTE — Telephone Encounter (Signed)
Prior Auth pending under RX benefits.

## 2023-04-23 NOTE — Telephone Encounter (Signed)
NOT COVERED by insurance  Pt will need to pay out of pocket cost at time of visit per management. Pt is aware the her primary insurance will not cover Zilretta.

## 2023-04-23 NOTE — Telephone Encounter (Signed)
Prior Authorization initiated for [facility] via [email or web address] KEY: [id number]  SPECIALTY PHARMACY

## 2023-04-24 ENCOUNTER — Other Ambulatory Visit: Payer: Self-pay

## 2023-04-24 ENCOUNTER — Other Ambulatory Visit (HOSPITAL_BASED_OUTPATIENT_CLINIC_OR_DEPARTMENT_OTHER): Payer: Self-pay

## 2023-04-24 ENCOUNTER — Encounter (HOSPITAL_COMMUNITY): Payer: Self-pay

## 2023-04-24 ENCOUNTER — Ambulatory Visit: Payer: Managed Care, Other (non HMO) | Admitting: Family Medicine

## 2023-04-24 MED ORDER — TRIAMCINOLONE ACETONIDE 32 MG IX SRER
64.0000 mg | Freq: Once | INTRA_ARTICULAR | 0 refills | Status: AC
  Filled 2023-04-24 (×2): qty 10, 1d supply, fill #0

## 2023-04-24 NOTE — Addendum Note (Signed)
Addended by: Dierdre Searles on: 04/24/2023 11:02 AM   Modules accepted: Orders

## 2023-04-24 NOTE — Telephone Encounter (Signed)
Called and spoke with patient. Rx will not be available until midday tomorrow.   Rx sent to pharmacy so pt can call for OOP cost and possible r/s appt to tomorrow at 3:30 PM.   A temporary hold has been placed on Dr. Zollie Pee schedule for tomorrow at 3:30 PM until we hear back from patient about obtaining via Specialty Pharmacy or proceeding with buy and bill option.

## 2023-04-24 NOTE — Telephone Encounter (Signed)
Spoke with patient, we are attempting to obtain Zilretta through pharmacy, [facility].   Will reach out to pt around 11:00 AM if I have not gotten any updates and pt will plan to pay the OOP for today's 2 Zilretta injections. Pt has been made aware of OOP cost due at check-in, $2,496.00.   She also states that she has been waiting to receive the bills for prior Zilretta injections but has not received any bills from [facility] or from her insurance company.   Will forward to scheduling, billing, and management as FYI.

## 2023-04-24 NOTE — Progress Notes (Signed)
Specialty Pharmacy Initial Fill Coordination Note  Shawna Johnson is a 57 y.o. female contacted today regarding refills of specialty medication(s) Triamcinolone Acetonide (Glucocorticosteroids)   Patient requested Courier to Provider Office   Delivery date: 04/25/23   Verified address: [facility] [place]   Medication will be filled on 10/23.   Patient is aware of $1,495 copayment. She is paying out of pocket

## 2023-04-24 NOTE — Telephone Encounter (Signed)
Shawna Johnson has been denied for coverage by Rosann Auerbach under Medical Buy and Bill and Specialty Pharmacy.

## 2023-04-25 ENCOUNTER — Ambulatory Visit: Payer: Managed Care, Other (non HMO) | Admitting: Family Medicine

## 2023-04-25 ENCOUNTER — Encounter: Payer: Self-pay | Admitting: Family Medicine

## 2023-04-25 ENCOUNTER — Other Ambulatory Visit: Payer: Self-pay

## 2023-04-25 VITALS — BP 126/84 | HR 84 | Ht 62.0 in | Wt 152.0 lb

## 2023-04-25 DIAGNOSIS — M25562 Pain in left knee: Secondary | ICD-10-CM | POA: Diagnosis not present

## 2023-04-25 DIAGNOSIS — M170 Bilateral primary osteoarthritis of knee: Secondary | ICD-10-CM

## 2023-04-25 DIAGNOSIS — G8929 Other chronic pain: Secondary | ICD-10-CM | POA: Diagnosis not present

## 2023-04-25 DIAGNOSIS — M25561 Pain in right knee: Secondary | ICD-10-CM

## 2023-04-25 MED ORDER — TRIAMCINOLONE ACETONIDE 32 MG IX SRER
32.0000 mg | Freq: Once | INTRA_ARTICULAR | Status: AC
Start: 2023-04-25 — End: 2023-04-25
  Administered 2023-04-25: 32 mg via INTRA_ARTICULAR

## 2023-04-25 NOTE — Patient Instructions (Signed)
Thank you for coming in today.   You received an injection today. Seek immediate medical attention if the joint becomes red, extremely painful, or is oozing fluid.   Have a great trip!

## 2023-04-25 NOTE — Progress Notes (Signed)
I, Stevenson Clinch, CMA acting as a Neurosurgeon for Clementeen Graham, MD.  Shawna Johnson is a 57 y.o. female who presents to [facility] today for cont'd bilateral knee pain. Pt was last seen by Dr. Denyse Amass on 02/08/23 and was given repeat bilat Zilretta injections.  Today, pt reports flare up of bilat knee pain, L>R. Has started noticing some swelling.   She is leaving for trip tomorrow.  She is planning on having knee replacement surgery in the near.  Dx imaging: 09/10/2022 R & L knee XR 07/21/20 R & L knee XR  Pertinent review of systems: No fevers or chills  Relevant historical information: Hypertension   Exam:  BP 126/84   Pulse 84   Ht 5\' 2"  (1.575 m)   Wt 152 lb (68.9 kg)   LMP 07/02/2013   SpO2 98%   BMI 27.80 kg/m  General: Well Developed, well nourished, and in no acute distress.   MSK: Bilateral knees moderate effusion.  No erythema.    Lab and Radiology Results   Zilretta injection bilateral knee Procedure: Real-time Ultrasound Guided Injection of right knee joint superior lateral patellar space Device: Philips Affiniti 50G Images permanently stored and available for review in PACS Verbal informed consent obtained.  Discussed risks and benefits of procedure. Warned about infection, hyperglycemia bleeding, damage to structures among others. Patient expresses understanding and agreement Time-out conducted.   Noted no overlying erythema, induration, or other signs of local infection.   Skin prepped in a sterile fashion.   Local anesthesia: Topical Ethyl chloride.   With sterile technique and under real time ultrasound guidance: Zilretta 32 mg injected into knee joint. Fluid seen entering the joint capsule.   Completed without difficulty   Advised to call if fevers/chills, erythema, induration, drainage, or persistent bleeding.   Images permanently stored and available for review in the ultrasound unit.  Impression: Technically successful  ultrasound guided injection.  Procedure: Real-time Ultrasound Guided Injection of left knee joint superior lateral patellar space Device: Philips Affiniti 50G Images permanently stored and available for review in PACS Verbal informed consent obtained.  Discussed risks and benefits of procedure. Warned about infection, hyperglycemia bleeding, damage to structures among others. Patient expresses understanding and agreement Time-out conducted.   Noted no overlying erythema, induration, or other signs of local infection.   Skin prepped in a sterile fashion.   Local anesthesia: Topical Ethyl chloride.   With sterile technique and under real time ultrasound guidance: Zilretta 32 mg injected into knee joint. Fluid seen entering the joint capsule.   Completed without difficulty   Advised to call if fevers/chills, erythema, induration, drainage, or persistent bleeding.   Images permanently stored and available for review in the ultrasound unit.  Impression: Technically successful ultrasound guided injection.  Lot number: [id number] for both injections Of note patient purchased her own Zilretta through a pharmacy.  Will not bill the Zilretta component of today's injection.       Assessment and Plan: 57 y.o. female with bilateral knee pain due to DJD.  Plan for Zilretta injection today.  She is planning on having knee replacement surgery around February. We could do PRP prior to surgery if needed for temporary pain control.  Gel injections have been used and not worked in the past.   PDMP not reviewed this encounter. Orders Placed This Encounter  Procedures   [place] LIMITED JOINT SPACE STRUCTURES LOW BILAT(NO LINKED CHARGES)    Order Specific Question:   Reason  for Exam (SYMPTOM  OR DIAGNOSIS REQUIRED)    Answer:   bilateral knee pain/OA    Order Specific Question:   Preferred imaging location?    Answer:   [facility] ordered this encounter  Medications    Triamcinolone Acetonide (ZILRETTA) intra-articular injection 32 mg   Triamcinolone Acetonide (ZILRETTA) intra-articular injection 32 mg     Discussed warning signs or symptoms. Please see discharge instructions. Patient expresses understanding.   The above documentation has been reviewed and is accurate and complete Clementeen Graham, M.D.

## 2023-04-26 NOTE — Telephone Encounter (Signed)
Pt received Zilretta injections for BILAT knee pain 04/25/23.  Can consider repeat injections on or after 07/19/23.

## 2023-05-20 DIAGNOSIS — M1712 Unilateral primary osteoarthritis, left knee: Secondary | ICD-10-CM | POA: Insufficient documentation

## 2023-06-04 ENCOUNTER — Other Ambulatory Visit: Payer: Self-pay

## 2023-06-04 ENCOUNTER — Ambulatory Visit: Payer: Managed Care, Other (non HMO) | Admitting: Family Medicine

## 2023-06-04 ENCOUNTER — Encounter: Payer: Self-pay | Admitting: Family Medicine

## 2023-06-04 VITALS — BP 150/82 | HR 105 | Ht 62.0 in | Wt 153.0 lb

## 2023-06-04 DIAGNOSIS — M25562 Pain in left knee: Secondary | ICD-10-CM

## 2023-06-04 DIAGNOSIS — G8929 Other chronic pain: Secondary | ICD-10-CM | POA: Diagnosis not present

## 2023-06-04 NOTE — Patient Instructions (Signed)
Thank you for coming in today.   Please use Voltaren gel (Generic Diclofenac Gel) up to 4x daily for pain as needed.  This is available over-the-counter as both the name brand Voltaren gel and the generic diclofenac gel.   Let me know if that is not working.

## 2023-06-04 NOTE — Progress Notes (Signed)
   I, Stevenson Clinch, CMA acting as a Neurosurgeon for Shawna Graham, MD.  Shawna Johnson is a 57 y.o. female who presents to [facility] at [facility] today for worsening knee pain. Pt was previously seen by Dr. Denyse Amass on 04/25/23 for bilat Zilretta injection  Today, pt c/o worsening left knee pain x 2-3 weeks. Pt locates pain to medial aspect of the knee, distal to patella. She notes this pain is different that her "arthritis pain." Notes that sx have improved some over night. Has been using ice and taking tylenol as needed. Unable to take IBU d/t hx of ulcer. Does get some relief with tylenol. Swelling present, warmth and erythema have resolved. Scheduled for knee replacement 08/05/2023.  Dx imaging: 09/10/2022 R & L knee XR 07/21/20 R & L knee XR  Pertinent review of systems: No fevers or chills  Relevant historical information: Knee arthritis   Exam:  BP (!) 150/82   Pulse (!) 105   Ht 5\' 2"  (1.575 m)   Wt 153 lb (69.4 kg)   LMP 07/02/2013   SpO2 97%   BMI 27.98 kg/m  General: Well Developed, well nourished, and in no acute distress.   MSK: Left knee mild effusion.  Normal-appearing otherwise. Tender palpation anterior medial knee near the joint line. Normal knee motion. Stable ligamentous exam.    Lab and Radiology Results  Diagnostic Limited MSK Ultrasound of: Right knee No significant swelling at the Pes anserine bursa. Source of tenderness to palpation with ultrasound probe looks to be around the inferior edge of the anterior medial joint line. Impression: DJD      Assessment and Plan: 57 y.o. female with right anterior medial knee pain thought to be exacerbation of DJD.  Today her ultrasound does not show significant Pes anserine bursitis. I think her symptoms were due to an exacerbation of arthritis.  She has a knee replacement scheduled for early February. Recommend Voltaren gel and continued quad strengthening exercises as tolerated.  PDMP not  reviewed this encounter. Orders Placed This Encounter  Procedures   [place] LIMITED JOINT SPACE STRUCTURES LOW LEFT(NO LINKED CHARGES)    Order Specific Question:   Reason for Exam (SYMPTOM  OR DIAGNOSIS REQUIRED)    Answer:   left knee pain    Order Specific Question:   Preferred imaging location?    Answer:   [facility]   No orders of the defined types were placed in this encounter.    Discussed warning signs or symptoms. Please see discharge instructions. Patient expresses understanding.   The above documentation has been reviewed and is accurate and complete Shawna Johnson, M.D.

## 2023-06-18 NOTE — Telephone Encounter (Signed)
BUY AND BILL - [place] REQUIRED  SPECIALTY PHARMACY - [place] REQUIRED [phone]

## 2023-06-19 NOTE — Telephone Encounter (Signed)
Prior Authorization initiated for [facility] via [email or web address] KEY: [id number]

## 2023-06-24 NOTE — Telephone Encounter (Signed)
Prior Auth for ZILRETTA denied  Denial letter scanned to pt chart.   Will need to reach out to pt to see if she would like new rx for Zilretta sent to [facility].

## 2023-07-09 ENCOUNTER — Other Ambulatory Visit: Payer: Self-pay | Admitting: Family Medicine

## 2023-07-09 DIAGNOSIS — E7800 Pure hypercholesterolemia, unspecified: Secondary | ICD-10-CM

## 2023-07-10 ENCOUNTER — Telehealth: Payer: Self-pay | Admitting: Family Medicine

## 2023-07-10 ENCOUNTER — Other Ambulatory Visit (HOSPITAL_COMMUNITY): Payer: Self-pay

## 2023-07-10 DIAGNOSIS — E7800 Pure hypercholesterolemia, unspecified: Secondary | ICD-10-CM

## 2023-07-10 MED ORDER — ATORVASTATIN CALCIUM 20 MG PO TABS: 20.0000 mg | ORAL_TABLET | Freq: Every day | ORAL | 1 refills | Status: DC

## 2023-07-10 NOTE — Telephone Encounter (Signed)
 Copied from CRM [id number]. Topic: Clinical - Prescription Issue >> Jul 10, 2023 10:54 AM Robinson H wrote: Reason for CRM: Patient states she needs prescription atorvastatin  (LIPITOR) 20 MG tablet sent the [facility] pharmacy, aware refill was sent to [facility] but prescription was cancelled through them because patient doesn't have that benefit plan anymore, patient is in between insurances and has a coupon for [facility] to get medication in the meantime.  [id number] - [place], [place] - [address] AT [address] [facility] [address] [facility] [address]  Phone: [phone] Fax: [phone]   Makenah  [phone]

## 2023-07-10 NOTE — Telephone Encounter (Signed)
 Rx sent

## 2023-07-18 ENCOUNTER — Other Ambulatory Visit: Payer: Self-pay

## 2023-07-29 ENCOUNTER — Encounter: Payer: Self-pay | Admitting: Internal Medicine

## 2023-08-03 HISTORY — PX: KNEE SURGERY: SHX244

## 2023-08-29 ENCOUNTER — Other Ambulatory Visit: Payer: Self-pay

## 2023-09-11 DIAGNOSIS — M1711 Unilateral primary osteoarthritis, right knee: Secondary | ICD-10-CM | POA: Insufficient documentation

## 2023-09-17 ENCOUNTER — Ambulatory Visit: Payer: Self-pay | Admitting: Gastroenterology

## 2023-09-17 ENCOUNTER — Other Ambulatory Visit: Payer: Self-pay | Admitting: Internal Medicine

## 2023-09-17 ENCOUNTER — Encounter: Payer: Self-pay | Admitting: Gastroenterology

## 2023-09-17 ENCOUNTER — Ambulatory Visit (INDEPENDENT_AMBULATORY_CARE_PROVIDER_SITE_OTHER)
Admission: RE | Admit: 2023-09-17 | Discharge: 2023-09-17 | Disposition: A | Discharge status: Home / Self Care | Source: Ambulatory Visit | Attending: Gastroenterology | Admitting: Gastroenterology

## 2023-09-17 VITALS — BP 142/80 | HR 100 | Ht 62.0 in | Wt 148.0 lb

## 2023-09-17 DIAGNOSIS — K2100 Gastro-esophageal reflux disease with esophagitis, without bleeding: Secondary | ICD-10-CM | POA: Diagnosis not present

## 2023-09-17 DIAGNOSIS — M8580 Other specified disorders of bone density and structure, unspecified site: Secondary | ICD-10-CM | POA: Diagnosis not present

## 2023-09-17 NOTE — Patient Instructions (Addendum)
 Go radiology on the basement floor of [facility] location for this test. If you need to cancel or reschedule for any reason, please contact radiology at [phone].  Preparation for test is as follows:  If you are taking calcium, discontinue this 24-48 hours prior to your appointment.  Wear pants with an elastic waistband (or without any metal such as a zipper).  Do not wear an underwire bra.  We do have gowns if you are unable to find appropriate clothing without metal.  Please bring a list of all current medications.

## 2023-09-17 NOTE — Progress Notes (Signed)
 09/17/2023 Shawna Johnson [id number] 01/16/1966   HISTORY OF PRESENT ILLNESS:  Shawna Johnson is a 58 year old female with a history of GERD with esophagitis and associated dysphagia, prior PUD, osteopenia who is here for follow-up.  She is here alone today and was last seen in March 2024.   She reports she has been doing very well.  She is maintained on pantoprazole 40 mg a day.  Heartburn is very well-controlled without dysphagia or odynophagia.  No abdominal pain.  Regular bowel movements.  No blood in stool or melena.   She did have bone mineral density in 07/2021 and is taking a daily calcium supplement with vitamin D.   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 Her smoking use included cigarettes and e-cigarettes. She started smoking about 40 years ago. She has a 15 pack-year smoking history. She quit smokeless tobacco use about 7 years ago. She reports current alcohol use of about 4.0 standard drinks of alcohol per week. She reports that she does not use drugs. family history includes Alcohol abuse in her father; Arthritis in her mother; Breast cancer in her maternal aunt; COPD in her father and sister; Cardiomyopathy in her sister; Congestive Heart Failure in her sister; Depression in her maternal grandfather; Diabetes in her maternal aunt; Drug abuse in her sister; Early death in her father; Heart  attack in her father; Heart disease in her father, paternal grandfather, paternal grandmother, and sister; High Cholesterol in her mother, paternal grandfather, paternal grandmother, and sister; High blood pressure in her mother and sister; Hyperlipidemia in her father; Hypertension in her father; Kidney disease in her sister; Learning disabilities in her sister; Melanoma in her maternal aunt; Mental illness in her maternal grandfather; Sudden death in her father. Allergies  Allergen Reactions   Misc. Sulfonamide Containing Compounds Swelling   Sulfa Antibiotics Swelling      Outpatient Encounter Medications as of 09/17/2023  Medication Sig   atorvastatin (LIPITOR) 20 MG tablet Take 1 tablet (20 mg total) by mouth daily.   calcium carbonate (OS-CAL) 1250 (500 Ca) MG chewable tablet Chew 2 tablets by mouth daily.   pantoprazole (PROTONIX) 40 MG tablet TAKE 1 TABLET DAILY   vitamin C (ASCORBIC ACID) 250 MG tablet Take 250 mg by mouth daily.   KRILL OIL PO Take by mouth. (Patient not taking: Reported on 09/17/2023)   [DISCONTINUED] mupirocin ointment (BACTROBAN) 2 % Apply 1 Application topically 2 (two) times daily.   No facility-administered encounter medications on file as of 09/17/2023.    REVIEW OF SYSTEMS  : All other systems reviewed and negative except where noted in the History of Present Illness.   PHYSICAL EXAM: BP (!) 142/80   Pulse 100   Ht 5\' 2"  (1.575 m)   Wt 148 lb (67.1 kg)   LMP  07/02/2013   BMI 27.07 kg/m  General: Well developed white female in no acute distress Head: Normocephalic and atraumatic Eyes:  Sclerae anicteric, conjunctiva pink. Ears: Normal auditory acuity Lungs: Clear throughout to auscultation; no W/R/R. Heart: Regular rate and rhythm; no M/R/G. Musculoskeletal: Symmetrical with no gross deformities  Skin: No lesions on visible extremities Extremities: No edema  Neurological: Alert oriented x 4, grossly non-focal Psychological:  Alert and  cooperative. Normal mood and affect  ASSESSMENT AND PLAN: 58 year old female with a history of GERD with esophagitis and associated dysphagia, prior PUD, osteopenia who is here for follow-up.    GERD with history of esophagitis and dysphagia responsive to PPI --she is doing very well on PPI therapy.  Her GERD with history of esophagitis warrants ongoing therapy.  She is happy with this as her symptoms are well-controlled.  -- Continue pantoprazole 40 mg daily -- Check magnesium, CMP and vitamin D at her next lab draw with PCP in a few months.   2.  Osteopenia --PPI as above.  No history of fracture.  Getting calcium supplementation. -- Check vitamin D as above. -- Calcium 1200 mg with vitamin D 1000 international units recommended daily -- Repeat bone mineral density now.     3.  Prior elevated liver enzymes --checked yearly at PCP, last in 12/2022 looked good.  Recheck with upcoming labs.   4.  Colon cancer screening --no adenomatous polyps at colonoscopy in January 2016 --Screening colonoscopy recommended January 2026   Annual follow-up  CC:  Avanell Shackleton, NP-C

## 2023-09-19 ENCOUNTER — Other Ambulatory Visit: Payer: Self-pay

## 2023-09-26 IMAGING — MR MR SHOULDER*R* WO/W CM
8 series · 40 of 40 positions shown · IV contrast (gadavist)
Comparison: Right shoulder radiographs 07/13/2021

CLINICAL DATA: Musculoskeletal neoplasm. Abnormal radiograph.
Suggested MRI. Shoulder/arm pain for 5 months.

EXAM:
MRI OF THE RIGHT SHOULDER WITHOUT AND WITH CONTRAST
TECHNIQUE: Multiplanar, multisequence MR imaging of the right shoulder was
performed before and after the administration of intravenous
contrast.
CONTRAST:  7mL GADAVIST GADOBUTROL 1 MMOL/ML IV SOLN

[Series 3: T2 fat-sat · axial · 4.0mm · 0.55mm/px · z∈[-60,+45]mm · 5 of 23 slices shown (1 of 3)]
[im 1/23]
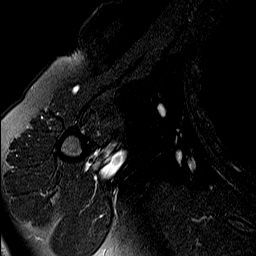
[im 6/23]
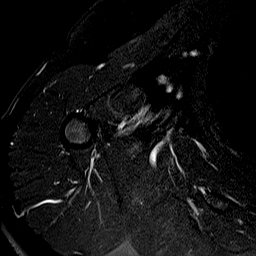
[im 12/23]
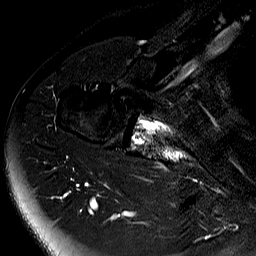
[im 17/23]
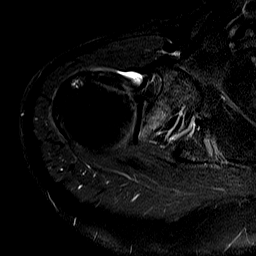
[im 23/23]
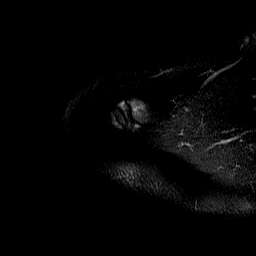

[Series 4: T2 fat-sat · oblique · 4.0mm · 0.55mm/px · 5 of 25 slices shown (2 of 3)]
[im 1/25]
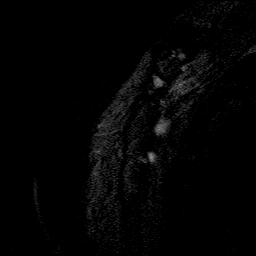
[im 7/25]
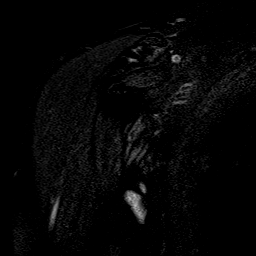
[im 13/25]
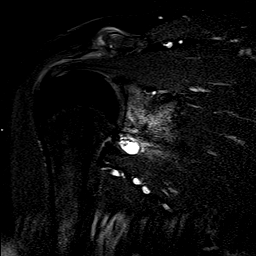
[im 19/25]
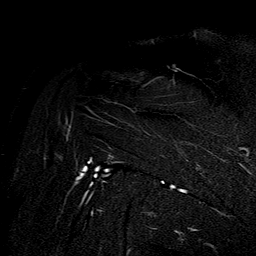
[im 25/25]
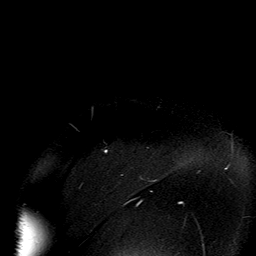

[Series 5: PD · oblique · 4.0mm · 0.55mm/px · 5 of 25 slices shown]
[im 1/25]
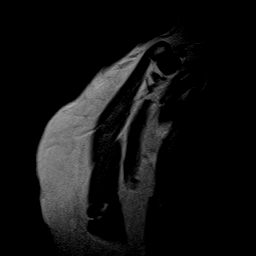
[im 7/25]
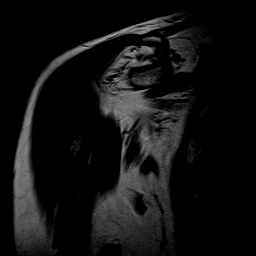
[im 13/25]
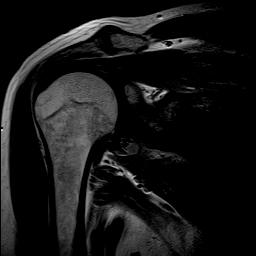
[im 19/25]
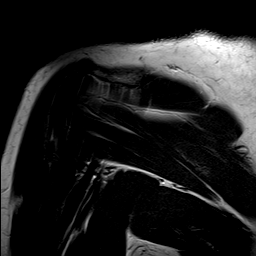
[im 25/25]
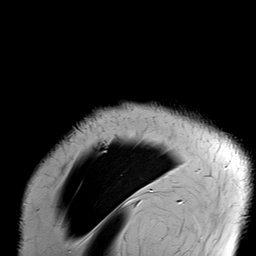

[Series 6: T1 · oblique · 4.0mm · 0.55mm/px · 5 of 24 slices shown]
[im 1/24]
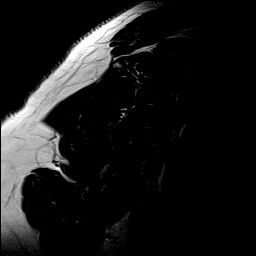
[im 6/24]
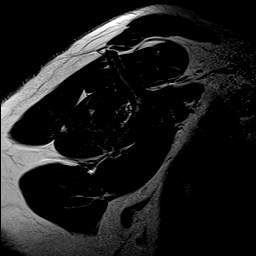
[im 12/24]
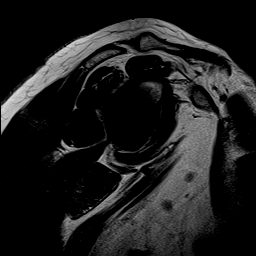
[im 18/24]
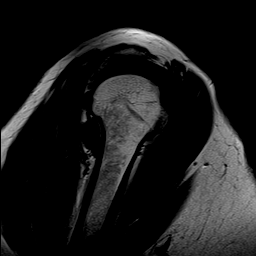
[im 24/24]
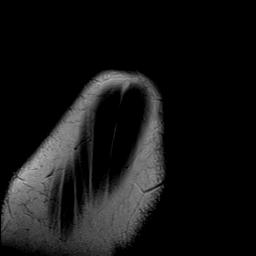

[Series 7: T2 fat-sat · oblique · 4.0mm · 0.59mm/px · 5 of 24 slices shown (3 of 3)]
[im 1/24]
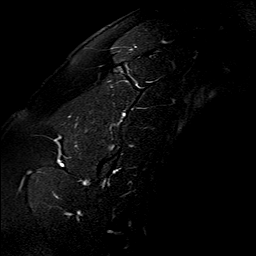
[im 6/24]
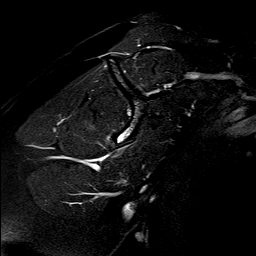
[im 12/24]
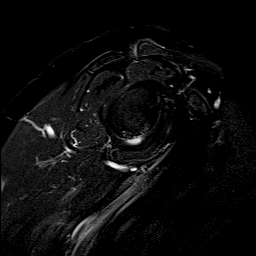
[im 18/24]
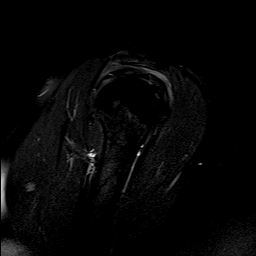
[im 24/24]
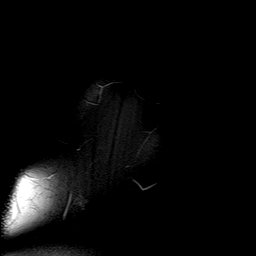

[Series 8: T1 fat-sat · axial · non-contrast · 4.0mm · 0.55mm/px · z∈[-60,+45]mm · 5 of 23 slices shown]
[im 1/23]
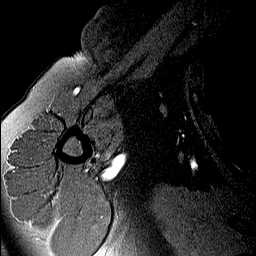
[im 6/23]
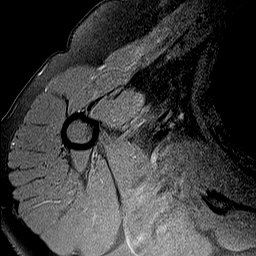
[im 12/23]
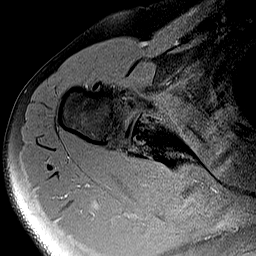
[im 17/23]
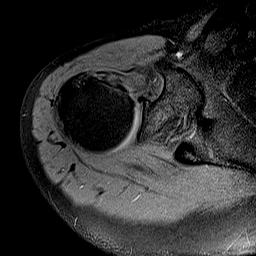
[im 23/23]
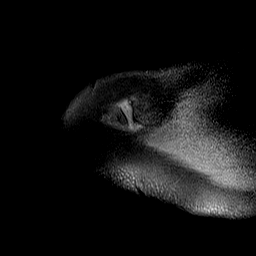

[Series 9: T1 fat-sat post-contrast · axial · 4.0mm · 0.55mm/px · z∈[-60,+45]mm · 5 of 23 slices shown (1 of 2)]
[im 1/23]
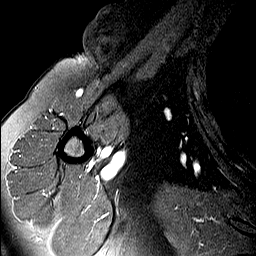
[im 6/23]
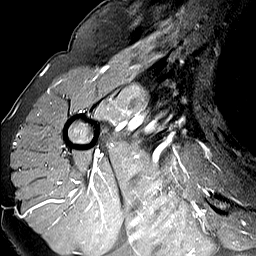
[im 12/23]
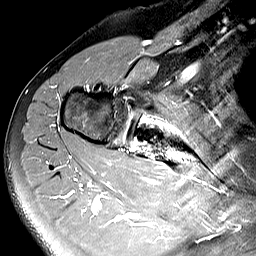
[im 17/23]
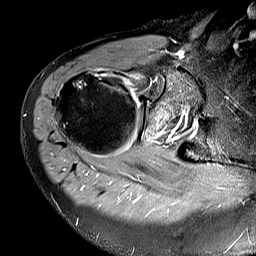
[im 23/23]
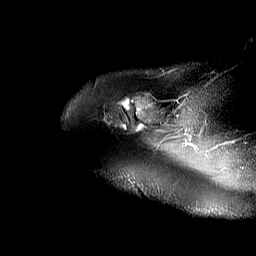

[Series 10: T1 fat-sat post-contrast · oblique · 4.0mm · 0.55mm/px · 5 of 25 slices shown (2 of 2)]
[im 1/25]
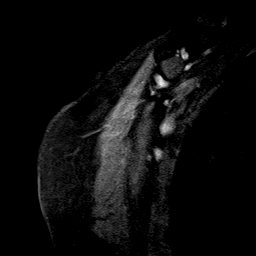
[im 7/25]
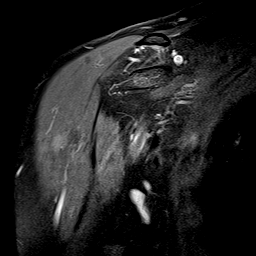
[im 13/25]
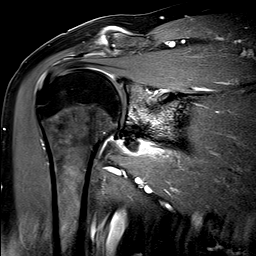
[im 19/25]
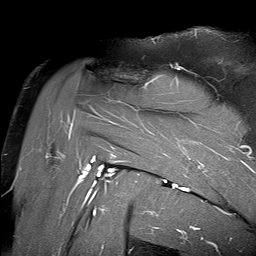
[im 25/25]
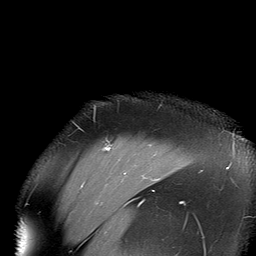

[40 of 40 positions shown; findings below may reference images not displayed]

FINDINGS: Rotator cuff: There is a punctate horizontal linear
partial-thickness tear within the anterior supraspinatus tendon
footprint measuring up to 6 mm in AP dimension and 4 mm in
transverse dimension (coronal image 15 and sagittal image 20). This
involves midsubstance of the inserting fibers. Mild underlying
supraspinatus tendinosis. No tendon retraction. Mild-to-moderate
anterior infraspinatus tendinosis. The subscapularis and teres minor
are intact.

Muscles: No rotator cuff muscle atrophy, fatty infiltration, or
edema.

Biceps long head: The intra-articular long head of the biceps tendon
is intact.

Acromioclavicular Joint: There are mild degenerative changes of the
acromioclavicular joint including joint space narrowing, subchondral
marrow edema, and peripheral osteophytosis. Type II acromion. No
subacromial/subdeltoid bursitis.

Glenohumeral Joint: Moderate thinning of the inferior glenoid and
humeral head cartilage.

Labrum: Grossly intact, but evaluation is limited by lack of
intraarticular fluid.

Bones: On recent radiographs there is apparent cortical erosion of
the anterior inferior aspect of the glenoid. In this region there is
teardrop-shaped 2.3 by 1.9 by 1.5 cm (transverse by AP by
craniocaudal) decreased T1 and increased T2 signal bordering the
bone where there is mild erosion. The fluid appears to taper medial
along the anterior inferior aspect of the glenoid from the deep
aspect of the inferior subscapularis musculature. There is
peripheral enhancement around the periphery and mild enhancing
septations within this fluid collection. No definite enhancing soft
tissue mass is seen. The fluid may be from a chronic ganglion.
Cannot exclude the presence of infection within this fluid. There is
also diffuse high-grade partial to full-thickness cartilage loss
throughout the superior greater than inferior aspects of the glenoid
with diffuse moderate to high-grade inferior glenoid degenerative
subchondral cystic change.

Other: None.
IMPRESSION: 1. In the region of the cortical erosion seen within the anterior
inferior glenoid on 07/13/2021 radiographs, there is peripherally
enhancing multi-septated fluid measuring up to 2.3 cm bordering the
region of bone erosion. No definite solid mass is seen to indicate
malignancy/tumor. The bone erosion may be secondary to chronic
pressure from this possible ganglion cystic fluid, however cannot
exclude infection within this fluid contributing to the cortical
erosion. Recommend clinical correlation. Consider orthopedic
consult.
2. Moderate inferior glenohumeral osteoarthritis.
3. Punctate partial-thickness tear within the anterior supraspinatus
tendon footprint.
4. Mild degenerative changes of the acromioclavicular joint.

## 2023-09-26 NOTE — Telephone Encounter (Signed)
 Spoke to patient over the phone to advise that we do have the results of her DEXA and did have a question for Dr Rhea Belton prior to giving recommendations/results. Advised we will be in touch as soon as possible with these results and recommendations.

## 2023-09-30 ENCOUNTER — Other Ambulatory Visit: Payer: Self-pay | Admitting: Family Medicine

## 2023-09-30 DIAGNOSIS — Z1231 Encounter for screening mammogram for malignant neoplasm of breast: Secondary | ICD-10-CM

## 2023-09-30 NOTE — Progress Notes (Signed)
 Addendum: Reviewed and agree with assessment and management plan. Asha Grumbine, Carie Caddy, MD

## 2023-10-03 ENCOUNTER — Encounter: Payer: Self-pay | Admitting: Family Medicine

## 2023-10-05 ENCOUNTER — Other Ambulatory Visit: Payer: Self-pay | Admitting: Family Medicine

## 2023-10-05 DIAGNOSIS — E7800 Pure hypercholesterolemia, unspecified: Secondary | ICD-10-CM

## 2023-10-14 ENCOUNTER — Ambulatory Visit (INDEPENDENT_AMBULATORY_CARE_PROVIDER_SITE_OTHER): Admitting: Family Medicine

## 2023-10-14 ENCOUNTER — Encounter: Payer: Self-pay | Admitting: Family Medicine

## 2023-10-14 VITALS — BP 126/84 | HR 77 | Temp 98.0°F | Ht 62.0 in | Wt 149.0 lb

## 2023-10-14 DIAGNOSIS — K2100 Gastro-esophageal reflux disease with esophagitis, without bleeding: Secondary | ICD-10-CM | POA: Diagnosis not present

## 2023-10-14 DIAGNOSIS — E7800 Pure hypercholesterolemia, unspecified: Secondary | ICD-10-CM

## 2023-10-14 DIAGNOSIS — M8580 Other specified disorders of bone density and structure, unspecified site: Secondary | ICD-10-CM | POA: Diagnosis not present

## 2023-10-14 NOTE — Patient Instructions (Signed)
 Continue getting at least 1200 mg of calcium in your diet and taking a low-dose calcium supplement.  Continue taking a vitamin D supplement and weightbearing exercises.   I will see you back for your physical in August and we should recheck your bone density test in 1 to 2 years.

## 2023-10-14 NOTE — Progress Notes (Signed)
 Subjective:     Patient ID: Shawna Johnson, female    DOB: 02/11/1966, 58 y.o.   MRN: [id number]  Chief Complaint  Patient presents with   Results    discuss worsening osteopenia on DEXA and possible treatment    HPI   History of Present Illness          She is here to discuss worsening osteopenia.  Her last DEXA scan was in March 2025.  Denies history of fracture.  She is taking high-dose PPI.  Under the care of of GI and sees them annually.  LMP: 2012   She recently had total knee replacement on the left and she is doing well. Surgeon Dr. Doristine Counter [facility] in [place]   She will have the right knee done in June 12/23/2023   Taking statin. Taking vitamin D, vitamin C and calcium.  She exercises and is active.    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   Hyperlipidemia    Preventative health care 10/24/2015    Past Surgical History:  Procedure Laterality Date   CERVICAL CONIZATION W/BX  05/2006   COSMETIC SURGERY     eyelid   INNER EAR SURGERY     several childhood   KNEE SURGERY Left 08/2023   TONSILLECTOMY AND ADENOIDECTOMY      Family History  Problem Relation Age of Onset   Arthritis Mother    High Cholesterol Mother    High blood pressure Mother    Heart attack Father    Hyperlipidemia Father    Hypertension Father    Sudden death Father    Heart disease Father        MI   Alcohol abuse Father    COPD Father    Early death Father    Congestive Heart Failure Sister    COPD Sister    Heart disease Sister        has pacer/defib in place    Cardiomyopathy Sister    Drug abuse Sister        crack in past   High  Cholesterol Sister    High blood pressure Sister    Kidney disease Sister    Learning disabilities Sister    Mental illness Maternal Grandfather        suicide   Depression Maternal Grandfather    Heart disease Paternal Grandmother    High Cholesterol Paternal Grandmother    Heart disease Paternal Grandfather    High Cholesterol Paternal Grandfather    Breast cancer Maternal Aunt    Melanoma Maternal Aunt    Diabetes Maternal Aunt    Colon cancer Neg Hx    Esophageal cancer Neg Hx    Stomach cancer Neg Hx    Rectal cancer Neg Hx     Social History   Socioeconomic History   Marital status: Married    Spouse name: Not on file   Number of children: 1   Years of education: Not on file   Highest education level: Associate degree: occupational, Scientist, product/process development, or vocational program  Occupational History   Occupation: Stylist  Tobacco Use  Smoking status: Former    Current packs/day: 0.00    Average packs/day: 0.5 packs/day for 30.0 years (15.0 ttl pk-yrs)    Types: Cigarettes, E-cigarettes    Start date: 07/03/1983    Quit date: 07/02/2013    Years since quitting: 10.2   Smokeless tobacco: Former    Quit date: 04/24/2016   Tobacco comments:    Reported smoking history:     Years smoked: 33     Average PPD: 3/4     Pack years = 24.75      She quit smoking in 2015  Vaping Use   Vaping status: Never Used  Substance and Sexual Activity   Alcohol use: Yes    Alcohol/week: 4.0 standard drinks of alcohol    Types: 4 Glasses of wine per week    Comment: weekly   Drug use: No   Sexual activity: Yes    Partners: Male    Birth control/protection: Post-menopausal    Comment: lives with husband, no dietary restrictions, works as Producer, television/film/video, 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  Other Topics Concern   Not on file  Social History Narrative   Not on file   Social Drivers of Health   Financial Resource Strain: Low Risk  (10/13/2023)   Overall Financial Resource Strain (CARDIA)    Difficulty  of Paying Living Expenses: Not hard at all  Food Insecurity: No Food Insecurity (10/13/2023)   Hunger Vital Sign    Worried About Running Out of Food in the Last Year: Never true    Ran Out of Food in the Last Year: Never true  Transportation Needs: No Transportation Needs (10/13/2023)   PRAPARE - Administrator, Civil Service (Medical): No    Lack of Transportation (Non-Medical): No  Physical Activity: Sufficiently Active (10/13/2023)   Exercise Vital Sign    Days of Exercise per Week: 6 days    Minutes of Exercise per Session: 60 min  Stress: No Stress Concern Present (10/13/2023)   [employer] of Occupational Health - Occupational Stress Questionnaire    Feeling of Stress : Not at all  Social Connections: Socially Integrated (10/13/2023)   Social Connection and Isolation Panel [NHANES]    Frequency of Communication with Friends and Family: More than three times a week    Frequency of Social Gatherings with Friends and Family: Three times a week    Attends Religious Services: 1 to 4 times per year    Active Member of Clubs or Organizations: Yes    Attends Banker Meetings: More than 4 times per year    Marital Status: Married  Catering manager Violence: Not At Risk (08/04/2022)   Received from [facility]   HITS    Over the last 12 months how often did your partner physically hurt you?: Never    Over the last 12 months how often did your partner insult you or talk down to you?: Never    Over the last 12 months how often did your partner threaten you with physical harm?: Never    Over the last 12 months how often did your partner scream or curse at you?: Never    Outpatient Medications Prior to Visit  Medication Sig Dispense Refill   atorvastatin (LIPITOR) 20 MG tablet TAKE 1 TABLET(20 MG) BY MOUTH DAILY 90 tablet 1   calcium carbonate (OS-CAL) 1250 (500 Ca) MG chewable tablet Chew 2 tablets by mouth daily.     KRILL OIL PO Take by  mouth.      pantoprazole (PROTONIX) 40 MG tablet TAKE 1 TABLET DAILY 90 tablet 0   vitamin C (ASCORBIC ACID) 250 MG tablet Take 250 mg by mouth daily.     No facility-administered medications prior to visit.    Allergies  Allergen Reactions   Misc. Sulfonamide Containing Compounds Swelling   Sulfa Antibiotics Swelling    Review of Systems  Constitutional:  Negative for chills, fever and malaise/fatigue.  Respiratory:  Negative for shortness of breath.   Cardiovascular:  Negative for chest pain, palpitations and leg swelling.  Gastrointestinal:  Negative for abdominal pain, constipation, diarrhea, nausea and vomiting.  Neurological:  Negative for dizziness, focal weakness and headaches.       Objective:    Physical Exam Constitutional:      General: She is not in acute distress.    Appearance: She is not ill-appearing.  Eyes:     Extraocular Movements: Extraocular movements intact.     Conjunctiva/sclera: Conjunctivae normal.  Cardiovascular:     Rate and Rhythm: Normal rate.  Pulmonary:     Effort: Pulmonary effort is normal.  Musculoskeletal:     Cervical back: Normal range of motion and neck supple.  Skin:    General: Skin is warm and dry.  Neurological:     General: No focal deficit present.     Mental Status: She is alert and oriented to person, place, and time.  Psychiatric:        Mood and Affect: Mood normal.        Behavior: Behavior normal.        Thought Content: Thought content normal.      BP 126/84   Pulse 77   Temp 98 F (36.7 C) (Temporal)   Ht 5\' 2"  (1.575 m)   Wt 149 lb (67.6 kg)   LMP 07/02/2013   SpO2 98%   BMI 27.25 kg/m  Wt Readings from Last 3 Encounters:  10/14/23 149 lb (67.6 kg)  09/17/23 148 lb (67.1 kg)  06/04/23 153 lb (69.4 kg)       Assessment & Plan:   Problem List Items Addressed This Visit     Gastroesophageal reflux disease with esophagitis without hemorrhage   Hyperlipidemia   Other Visit Diagnoses       Osteopenia  determined by x-ray    -  Primary      Reviewed bone density results.  We discussed low bone mass and the significance of her last bone density compared to the one in 2023.  Discussed getting adequate calcium in her diet and taking a calcium supplement.  She will continue taking a vitamin D3 supplement and getting regular weightbearing exercises.  We discussed repeating the DEXA in 1 to 2 years.  We will hold off on starting a bisphosphonate at this time since it is not indicated.  She is pleased with this plan.  I will see her back for a fasting CPE in August.  We will continue to monitor her calcium and vitamin D levels.  I am having Nichoel  A. Sneed "Ginger" maintain her calcium carbonate, vitamin C, KRILL OIL PO, pantoprazole, and atorvastatin.  No orders of the defined types were placed in this encounter.

## 2023-10-23 ENCOUNTER — Ambulatory Visit
Admission: RE | Admit: 2023-10-23 | Discharge: 2023-10-23 | Disposition: A | Discharge status: Home / Self Care | Source: Ambulatory Visit | Attending: Family Medicine | Admitting: Family Medicine

## 2023-10-23 DIAGNOSIS — Z1231 Encounter for screening mammogram for malignant neoplasm of breast: Secondary | ICD-10-CM

## 2023-11-13 ENCOUNTER — Other Ambulatory Visit: Payer: Self-pay | Admitting: Pharmacy Technician

## 2023-11-13 ENCOUNTER — Other Ambulatory Visit: Payer: Self-pay

## 2023-11-13 NOTE — Progress Notes (Signed)
 Disenrolled; Patient has not gotten Inject since 04/2023 & MD office never sent anymore rx.

## 2023-12-16 ENCOUNTER — Other Ambulatory Visit: Payer: Self-pay | Admitting: Internal Medicine

## 2024-01-01 ENCOUNTER — Encounter: Payer: Managed Care, Other (non HMO) | Admitting: Family Medicine

## 2024-02-05 ENCOUNTER — Encounter: Payer: Self-pay | Admitting: Family Medicine

## 2024-02-05 ENCOUNTER — Ambulatory Visit (INDEPENDENT_AMBULATORY_CARE_PROVIDER_SITE_OTHER): Payer: Self-pay | Admitting: Family Medicine

## 2024-02-05 ENCOUNTER — Ambulatory Visit: Payer: Self-pay | Admitting: Family Medicine

## 2024-02-05 VITALS — BP 126/82 | HR 80 | Temp 97.6°F | Ht 62.0 in | Wt 148.0 lb

## 2024-02-05 DIAGNOSIS — J449 Chronic obstructive pulmonary disease, unspecified: Secondary | ICD-10-CM | POA: Diagnosis not present

## 2024-02-05 DIAGNOSIS — M8580 Other specified disorders of bone density and structure, unspecified site: Secondary | ICD-10-CM

## 2024-02-05 DIAGNOSIS — Z79899 Other long term (current) drug therapy: Secondary | ICD-10-CM

## 2024-02-05 DIAGNOSIS — E7800 Pure hypercholesterolemia, unspecified: Secondary | ICD-10-CM

## 2024-02-05 DIAGNOSIS — Z0001 Encounter for general adult medical examination with abnormal findings: Secondary | ICD-10-CM

## 2024-02-05 LAB — COMPREHENSIVE METABOLIC PANEL WITH GFR
ALT: 17 U/L (ref 0–35)
AST: 18 U/L (ref 0–37)
Albumin: 4.5 g/dL (ref 3.5–5.2)
Alkaline Phosphatase: 124 U/L — ABNORMAL HIGH (ref 39–117)
BUN: 13 mg/dL (ref 6–23)
CO2: 30 meq/L (ref 19–32)
Calcium: 10 mg/dL (ref 8.4–10.5)
Chloride: 101 meq/L (ref 96–112)
Creatinine, Ser: 0.72 mg/dL (ref 0.40–1.20)
GFR: 91.1 mL/min (ref >60.00)
Glucose, Bld: 97 mg/dL (ref 70–99)
Potassium: 3.5 meq/L (ref 3.5–5.1)
Sodium: 139 meq/L (ref 135–145)
Total Bilirubin: 0.5 mg/dL (ref 0.2–1.2)
Total Protein: 7.2 g/dL (ref 6.0–8.3)

## 2024-02-05 LAB — CBC WITH DIFFERENTIAL/PLATELET
Basophils Absolute: 0 K/uL (ref 0.0–0.1)
Basophils Relative: 0.5 % (ref 0.0–3.0)
Eosinophils Absolute: 0.2 K/uL (ref 0.0–0.7)
Eosinophils Relative: 2.6 % (ref 0.0–5.0)
HCT: 40 % (ref 36.0–46.0)
Hemoglobin: 13 g/dL (ref 12.0–15.0)
Lymphocytes Relative: 43.8 % (ref 12.0–46.0)
Lymphs Abs: 3.3 K/uL (ref 0.7–4.0)
MCHC: 32.5 g/dL (ref 30.0–36.0)
MCV: 89.6 fl (ref 78.0–100.0)
Monocytes Absolute: 0.7 K/uL (ref 0.1–1.0)
Monocytes Relative: 9.5 % (ref 3.0–12.0)
Neutro Abs: 3.3 K/uL (ref 1.4–7.7)
Neutrophils Relative %: 43.6 % (ref 43.0–77.0)
Platelets: 393 K/uL (ref 150.0–400.0)
RBC: 4.46 Mil/uL (ref 3.87–5.11)
RDW: 14.1 % (ref 11.5–15.5)
WBC: 7.6 K/uL (ref 4.0–10.5)

## 2024-02-05 LAB — LIPID PANEL
Cholesterol: 191 mg/dL (ref 0–200)
HDL: 58.6 mg/dL (ref >39.00)
LDL Cholesterol: 103 mg/dL — ABNORMAL HIGH (ref 0–99)
NonHDL: 132.38
Total CHOL/HDL Ratio: 3
Triglycerides: 147 mg/dL (ref 0.0–149.0)
VLDL: 29.4 mg/dL (ref 0.0–40.0)

## 2024-02-05 LAB — TSH: TSH: 0.79 u[IU]/mL (ref 0.35–5.50)

## 2024-02-05 LAB — VITAMIN D 25 HYDROXY (VIT D DEFICIENCY, FRACTURES): VITD: 46.13 ng/mL (ref 30.00–100.00)

## 2024-02-05 NOTE — Progress Notes (Signed)
 Complete physical exam  Patient: Skyllar  A Dunnam   DOB: 08-31-1965   58 y.o. Female  MRN: [id number]  Subjective:    Chief Complaint  Patient presents with   Annual Exam   Discussed the use of AI scribe software for clinical note transcription with the patient, who gave verbal consent to proceed.  History of Present Illness Emali  A Prisha Hiley is a 58 year old female who presents for preventive care.  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 and right knee replacement in June 2023 - Recovery from right knee replacement has been more challenging than the left - Completing physical therapy next week - Plans to continue exercise regimen at the gym - Committed to maintaining exercise routine  Preventive health maintenance - Due for colonoscopy and Pap smear next year - Considering scheduling breast and pelvic exam - Recent eye examination completed - Upcoming dental visit scheduled - Plans to discuss potential need for antibiotic prophylaxis prior to dental cleanings due to knee replacements  Pulmonary and cardiac findings - Quit smoking in 2014 or 2015 - Previous lung examination showed emphysema and cardiomegaly - Cardiology evaluation was normal - No current respiratory symptoms - Considering repeat lung examination this year - No history of pneumonia vaccination       Health Maintenance  Topic Date Due   HIV Screening  Never done   Pneumococcal Vaccine for high risk medical condition (1 of 2 - PCV) Never done   Pneumococcal Vaccine for age over 101 (1 of 2 - PCV) Never done   Flu Shot  01/31/2024   COVID-19 Vaccine (12 - 2024-25 season) 02/21/2024*   Colon Cancer Screening  07/20/2024   Mammogram  10/22/2025   DTaP/Tdap/Td vaccine (2 - Td or Tdap) 10/23/2025   Pap with HPV screening  12/26/2026   Hepatitis C Screening  Completed   Zoster (Shingles) Vaccine  Completed   Hepatitis B Vaccine  Aged Out    HPV Vaccine  Aged Out   Meningitis B Vaccine  Aged Out  *Topic was postponed. The date shown is not the original due date.    Wears seatbelt always, uses sunscreen, smoke detectors in home and functioning, does not text while driving, feels safe in home environment.  Depression screening:    02/05/2024    1:03 PM 10/14/2023    1:04 PM 03/14/2023    1:49 PM  Depression screen PHQ 2/9  Decreased Interest 0 0 0  Down, Depressed, Hopeless 0 0 0  PHQ - 2 Score 0 0 0   Anxiety Screening:    07/12/2020   10:09 AM  GAD 7 : Generalized Anxiety Score  Nervous, Anxious, on Edge 0  Control/stop worrying 0  Worry too much - different things 0  Trouble relaxing 0  Restless 0  Easily annoyed or irritable 0  Afraid - awful might happen 0  Total GAD 7 Score 0  Anxiety Difficulty Not difficult at all    Vision:Within last year and Dental: No current dental problems and Receives regular dental care  Patient Active Problem List   Diagnosis Date Noted   COPD, mild (HCC) 02/05/2024   Gastroesophageal reflux disease with esophagitis without hemorrhage 09/17/2023   Nuclear sclerotic cataract of both eyes 01/28/2023   Former smoker 12/25/2022   Urinary frequency 12/25/2022   Encounter for general adult medical examination with abnormal findings 12/25/2022   Hematuria 12/25/2022   Exercise-induced tachycardia 12/25/2022   Situational  hypertension 02/01/2022   Osteopenia 02/01/2022   Adhesive capsulitis of toe, left 09/05/2021   Mass of joint of right shoulder 07/14/2021   Subscapularis tendinitis, right 07/13/2021   COVID-19 05/22/2021   Primary osteoarthritis of both knees 07/21/2020   Chronic pain of right ankle 07/21/2020   Peptic ulcer 07/12/2020   Rotator cuff tendinitis, left 07/12/2020   Cervical cancer screening 12/14/2019   Palpitations 12/09/2018   Mixed conductive and sensorineural hearing loss of right ear with restricted hearing of left ear 01/30/2018   Perforation of  right tympanic membrane 01/30/2018   Sensorineural hearing loss (SNHL) of left ear with restricted hearing of right ear 01/30/2018   Goiter, 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   Elevated alkaline phosphatase level 04/24/2016   Arthritis    Hepatic steatosis 08/23/2014   Multiple thyroid  nodules  see U/S 2015 mm sized in left lobe 05/16/2014   Breast calcifications on mammogram  R breast stable for 18 months [facility] 01/07/2014   Breast nodule  decreasing size mm 09/2013 [facility] 01/07/2014   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   Abnormal Pap smear of cervix  S/P Leep normal pap since 2007 12/22/2013   Hyperlipidemia 12/22/2013   Past Medical History:  Diagnosis Date   Abnormal alkaline phosphatase test 04/24/2016   Arthritis    COPD, mild (HCC) 02/05/2024   Elevated alkaline phosphatase level    Encounter for mastoidectomy cavity debridement, right 07/16/2017   Esophageal stenosis    Fatty liver    Gastric ulcer    Goiter, 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   Hyperlipidemia    Localized osteoarthritis of left knee 05/20/2023   Preventative health care 10/24/2015   Primary osteoarthritis of right knee 09/11/2023   Past Surgical History:  Procedure Laterality Date   CERVICAL CONIZATION W/BX  05/2006   COSMETIC SURGERY     eyelid   INNER EAR SURGERY     several childhood   KNEE SURGERY Left 08/2023   TONSILLECTOMY AND ADENOIDECTOMY     Social History   Tobacco Use   Smoking status: Former    Current packs/day: 0.00    Average packs/day: 0.5 packs/day for 30.0 years (15.0 ttl pk-yrs)    Types: Cigarettes, E-cigarettes    Start date: 07/03/1983    Quit date: 07/02/2013    Years since quitting: 10.6   Smokeless tobacco: Former    Quit date: 04/24/2016   Tobacco comments:    Reported smoking history:     Years smoked: 33     Average PPD: 3/4     Pack years = 24.75      She quit smoking in 2015  Vaping Use   Vaping status: Never Used  Substance Use Topics   Alcohol use: Yes     Alcohol/week: 4.0 standard drinks of alcohol    Types: 4 Glasses of wine per week    Comment: weekly   Drug use: No      Patient Care Team: Lendia Boby CROME, NP-C as PCP - General (Family Medicine) [place]   Outpatient Medications Prior to Visit  Medication Sig   atorvastatin  (LIPITOR) 20 MG tablet TAKE 1 TABLET(20 MG) BY MOUTH DAILY   calcium  carbonate (OS-CAL) 1250 (500 Ca) MG chewable tablet Chew 2 tablets by mouth daily.   KRILL OIL PO Take by mouth.   pantoprazole  (PROTONIX ) 40 MG tablet Take 1 tablet (40 mg total) by mouth daily.   vitamin C (ASCORBIC ACID) 250 MG tablet Take 250 mg by mouth  daily.   No facility-administered medications prior to visit.    Review of Systems  Constitutional:  Negative for chills, fever and malaise/fatigue.  HENT:  Negative for congestion, ear pain, sinus pain and sore throat.   Eyes:  Negative for blurred vision, double vision and pain.  Respiratory:  Negative for cough, shortness of breath and wheezing.   Cardiovascular:  Negative for chest pain, palpitations and leg swelling.  Gastrointestinal:  Negative for abdominal pain, constipation, diarrhea, nausea and vomiting.  Genitourinary:  Negative for dysuria, frequency and urgency.  Musculoskeletal:  Positive for joint pain. Negative for back pain and myalgias.  Skin:  Negative for rash.  Neurological:  Negative for dizziness, focal weakness and headaches.  Psychiatric/Behavioral:  Negative for depression. The patient is not nervous/anxious.        Objective:    BP 126/82   Pulse 80   Temp 97.6 F (36.4 C) (Temporal)   Ht 5' 2 (1.575 m)   Wt 148 lb (67.1 kg)   LMP 07/02/2013   SpO2 98%   BMI 27.07 kg/m  BP Readings from Last 3 Encounters:  02/05/24 126/82  10/14/23 126/84  09/17/23 (!) 142/80   Wt Readings from Last 3 Encounters:  02/05/24 148 lb (67.1 kg)  10/14/23 149 lb (67.6 kg)  09/17/23 148 lb (67.1 kg)    Physical Exam Constitutional:       General: She is not in acute distress.    Appearance: She is not ill-appearing.  HENT:     Right Ear: Tympanic membrane, ear canal and external ear normal.     Left Ear: Tympanic membrane, ear canal and external ear normal.     Nose: Nose normal.     Mouth/Throat:     Mouth: Mucous membranes are moist.     Pharynx: Oropharynx is clear.  Eyes:     Extraocular Movements: Extraocular movements intact.     Conjunctiva/sclera: Conjunctivae normal.     Pupils: Pupils are equal, round, and reactive to light.  Neck:     Thyroid : No thyroid  mass, thyromegaly or thyroid  tenderness.  Cardiovascular:     Rate and Rhythm: Normal rate and regular rhythm.     Pulses: Normal pulses.     Heart sounds: Normal heart sounds.  Pulmonary:     Effort: Pulmonary effort is normal.     Breath sounds: Normal breath sounds.  Abdominal:     General: Bowel sounds are normal.     Palpations: Abdomen is soft.     Tenderness: There is no abdominal tenderness. There is no right CVA tenderness, left CVA tenderness, guarding or rebound.  Musculoskeletal:        General: Normal range of motion.     Cervical back: Normal range of motion and neck supple. No tenderness.     Right lower leg: No edema.     Left lower leg: No edema.  Lymphadenopathy:     Cervical: No cervical adenopathy.  Skin:    General: Skin is warm and dry.     Findings: No lesion or rash.  Neurological:     General: No focal deficit present.     Mental Status: She is alert and oriented to person, place, and time.     Cranial Nerves: No cranial nerve deficit.     Sensory: No sensory deficit.     Motor: No weakness.     Gait: Gait normal.  Psychiatric:        Mood and Affect: Mood normal.  Behavior: Behavior normal.        Thought Content: Thought content normal.      No results found for any visits on 02/05/24.    Assessment & Plan:    Routine Health Maintenance and Physical Exam  Problem List Items Addressed This Visit      COPD, mild (HCC)   Encounter for general adult medical examination with abnormal findings - Primary   Relevant Orders   CBC with Differential/Platelet   Comprehensive metabolic panel with GFR   TSH   Hyperlipidemia   Relevant Orders   Lipid panel   Osteopenia   Relevant Orders   VITAMIN D  25 Hydroxy (Vit-D Deficiency, Fractures)   Other Visit Diagnoses       Medication management       Relevant Orders   VITAMIN D  25 Hydroxy (Vit-D Deficiency, Fractures)      Assessment and Plan Assessment & Plan Annual exam Due for colonoscopy and Pap smear next year. Plans to delay annual breast and pelvic exams for one more year.  Up to date with eye and dental exams, with a dental appointment scheduled next month. Cleared by cardiology. Declines HIV screening. Qualifies for pneumonia vaccine due to emphysema. - Schedule colonoscopy and Pap smear next year. - Refer to [facility] for annual breast and pelvic exams. - Schedule lung cancer screening with [facility]. - Schedule nurse visit for pneumonia vaccine.  Osteopenia Previously discussed in April with no new issues or changes in management. -continue vitamin D , calcium  and exercise.   Status post bilateral knee replacement Left knee replaced in February and right knee in June. Experiencing some discomfort but progressing well. Completing physical therapy next week and plans to continue exercises at the gym. Discussion about antibiotics before dental procedures due to knee replacements, but current guidelines are unclear. - Continue exercises at the gym after completing physical therapy. - Verify need for antibiotics before dental procedures with surgeon.  Emphysema No respiratory symptoms and able to exercise without issues. Due for lung cancer screening and qualifies for pneumonia vaccine. - Schedule lung cancer screening with [facility]. - Schedule nurse visit for pneumonia vaccine.    Return in about 6  months (around 08/07/2024) for chronic health conditions.     Boby Mackintosh, NP-C

## 2024-02-05 NOTE — Patient Instructions (Addendum)
 You can call and schedule with [facility]   Recommend scheduling a nurse visit or going to your local pharmacy to get the pneumonia vaccine.  You can get Prevnar 20 or Pneumovax 23.

## 2024-02-18 ENCOUNTER — Other Ambulatory Visit: Payer: Self-pay | Admitting: Acute Care

## 2024-02-18 DIAGNOSIS — Z87891 Personal history of nicotine dependence: Secondary | ICD-10-CM

## 2024-02-18 DIAGNOSIS — Z122 Encounter for screening for malignant neoplasm of respiratory organs: Secondary | ICD-10-CM

## 2024-03-03 ENCOUNTER — Encounter: Payer: Self-pay | Admitting: Sports Medicine

## 2024-03-10 ENCOUNTER — Inpatient Hospital Stay
Admission: RE | Admit: 2024-03-10 | Discharge: 2024-03-10 | Disposition: A | Discharge status: Home / Self Care | Source: Ambulatory Visit | Attending: Family Medicine | Admitting: Family Medicine

## 2024-03-10 DIAGNOSIS — Z122 Encounter for screening for malignant neoplasm of respiratory organs: Secondary | ICD-10-CM

## 2024-03-10 DIAGNOSIS — Z87891 Personal history of nicotine dependence: Secondary | ICD-10-CM

## 2024-03-16 ENCOUNTER — Telehealth: Payer: Self-pay | Admitting: Gastroenterology

## 2024-03-16 NOTE — Telephone Encounter (Signed)
 [facility] has been deleted out of patient profile and [facility] has been added as preferred home delivery option.

## 2024-03-16 NOTE — Telephone Encounter (Signed)
 Inbound call from pt stating that her pharmacy mail order has changed to [facility] a good contact number for them is [phone]. Please advise

## 2024-03-18 ENCOUNTER — Telehealth: Payer: Self-pay

## 2024-03-18 NOTE — Telephone Encounter (Signed)
 Copied from CRM [id number]. Topic: Clinical - Lab/Test Results >> Mar 17, 2024 11:36 AM Rilla B wrote: Reason for CRM: Patient had CT on 9/09 and would like to know her results. Please call patient @ [phone].  Routing to the [employer] nurses

## 2024-03-20 ENCOUNTER — Other Ambulatory Visit: Payer: Self-pay

## 2024-03-20 MED ORDER — PANTOPRAZOLE SODIUM 40 MG PO TBEC: 40.0000 mg | DELAYED_RELEASE_TABLET | Freq: Every day | ORAL | 1 refills | Status: AC

## 2024-03-21 ENCOUNTER — Encounter: Payer: Self-pay | Admitting: Family Medicine

## 2024-03-23 ENCOUNTER — Other Ambulatory Visit: Payer: Self-pay | Admitting: Acute Care

## 2024-03-23 DIAGNOSIS — Z87891 Personal history of nicotine dependence: Secondary | ICD-10-CM

## 2024-03-23 DIAGNOSIS — Z122 Encounter for screening for malignant neoplasm of respiratory organs: Secondary | ICD-10-CM

## 2024-06-17 ENCOUNTER — Other Ambulatory Visit: Payer: Self-pay | Admitting: Family Medicine

## 2024-06-17 DIAGNOSIS — E7800 Pure hypercholesterolemia, unspecified: Secondary | ICD-10-CM

## 2024-06-17 MED ORDER — ATORVASTATIN CALCIUM 20 MG PO TABS: ORAL_TABLET | ORAL | 1 refills | Status: AC

## 2024-06-17 NOTE — Telephone Encounter (Signed)
 Copied from CRM #[id number]. Topic: Clinical - Medication Refill >> Jun 17, 2024 10:36 AM Alfonso ORN wrote: Medication:  atorvastatin  (LIPITOR) 20 MG tablet   Has the patient contacted their pharmacy? Yes  This is the patient's preferred pharmacy:  [facility] [place] [place] - [address] [place] [address] Phone: [phone] Fax: [phone]  Is this the correct pharmacy for this prescription? Yes   Has the prescription been filled recently? No, 10/20  Is the patient out of the medication? No  Has the patient been seen for an appointment in the last year OR does the patient have an upcoming appointment? Yes  Can we respond through MyChart? Yes

## 2024-07-27 ENCOUNTER — Other Ambulatory Visit: Payer: Self-pay | Admitting: Family Medicine

## 2024-07-27 DIAGNOSIS — E7800 Pure hypercholesterolemia, unspecified: Secondary | ICD-10-CM

## 2024-08-04 ENCOUNTER — Other Ambulatory Visit: Payer: Self-pay
